# Patient Record
Sex: Female | Born: 1950 | Race: White | Hispanic: No | Smoking: Never smoker
Health system: Southern US, Community
[De-identification: ages and names within clinical notes are randomized; demographics above are authoritative.]

## PROBLEM LIST (undated history)

## (undated) DIAGNOSIS — E785 Hyperlipidemia, unspecified: Secondary | ICD-10-CM

## (undated) DIAGNOSIS — M1990 Unspecified osteoarthritis, unspecified site: Secondary | ICD-10-CM

## (undated) DIAGNOSIS — I480 Paroxysmal atrial fibrillation: Secondary | ICD-10-CM

## (undated) DIAGNOSIS — I483 Typical atrial flutter: Secondary | ICD-10-CM

## (undated) DIAGNOSIS — I10 Essential (primary) hypertension: Secondary | ICD-10-CM

## (undated) DIAGNOSIS — R0683 Snoring: Secondary | ICD-10-CM

## (undated) DIAGNOSIS — M5430 Sciatica, unspecified side: Secondary | ICD-10-CM

## (undated) DIAGNOSIS — E663 Overweight: Secondary | ICD-10-CM

## (undated) HISTORY — PX: REPLACEMENT TOTAL KNEE: SUR1224

## (undated) HISTORY — DX: Hyperlipidemia, unspecified: E78.5

## (undated) HISTORY — PX: APPENDECTOMY: SHX54

## (undated) HISTORY — DX: Unspecified osteoarthritis, unspecified site: M19.90

## (undated) HISTORY — DX: Overweight: E66.3

## (undated) HISTORY — DX: Paroxysmal atrial fibrillation: I48.0

## (undated) HISTORY — DX: Sciatica, unspecified side: M54.30

## (undated) HISTORY — DX: Essential (primary) hypertension: I10

## (undated) HISTORY — DX: Snoring: R06.83

## (undated) HISTORY — DX: Typical atrial flutter: I48.3

## (undated) HISTORY — PX: BREAST LUMPECTOMY: SHX2

## (undated) HISTORY — PX: KNEE SURGERY: SHX244

## (undated) HISTORY — PX: TONSILLECTOMY AND ADENOIDECTOMY: SUR1326

## (undated) HISTORY — PX: COLONOSCOPY: SHX174

---

## 1999-03-13 ENCOUNTER — Encounter: Payer: Self-pay | Admitting: Orthopedic Surgery

## 1999-03-15 ENCOUNTER — Inpatient Hospital Stay (HOSPITAL_COMMUNITY): Admission: RE | Admit: 1999-03-15 | Discharge: 1999-03-20 | Payer: Self-pay | Admitting: Orthopedic Surgery

## 1999-03-15 ENCOUNTER — Encounter: Payer: Self-pay | Admitting: Orthopedic Surgery

## 2001-08-13 ENCOUNTER — Encounter: Payer: Self-pay | Admitting: Family Medicine

## 2001-12-02 ENCOUNTER — Encounter: Payer: Self-pay | Admitting: Orthopedic Surgery

## 2001-12-04 ENCOUNTER — Inpatient Hospital Stay (HOSPITAL_COMMUNITY): Admission: RE | Admit: 2001-12-04 | Discharge: 2001-12-08 | Payer: Self-pay | Admitting: Orthopedic Surgery

## 2001-12-04 ENCOUNTER — Encounter: Payer: Self-pay | Admitting: Orthopedic Surgery

## 2005-01-31 ENCOUNTER — Encounter: Payer: Self-pay | Admitting: Family Medicine

## 2005-07-08 ENCOUNTER — Emergency Department: Payer: Self-pay | Admitting: Emergency Medicine

## 2009-01-02 ENCOUNTER — Ambulatory Visit: Payer: Self-pay | Admitting: Family Medicine

## 2009-01-02 DIAGNOSIS — R030 Elevated blood-pressure reading, without diagnosis of hypertension: Secondary | ICD-10-CM | POA: Insufficient documentation

## 2009-01-02 DIAGNOSIS — I4891 Unspecified atrial fibrillation: Secondary | ICD-10-CM | POA: Insufficient documentation

## 2009-01-13 ENCOUNTER — Ambulatory Visit: Payer: Self-pay | Admitting: Internal Medicine

## 2009-02-28 ENCOUNTER — Ambulatory Visit: Payer: Self-pay

## 2009-02-28 ENCOUNTER — Encounter: Payer: Self-pay | Admitting: Internal Medicine

## 2009-07-17 ENCOUNTER — Ambulatory Visit: Payer: Self-pay | Admitting: Family Medicine

## 2009-07-17 DIAGNOSIS — M549 Dorsalgia, unspecified: Secondary | ICD-10-CM | POA: Insufficient documentation

## 2010-05-01 NOTE — Assessment & Plan Note (Signed)
Summary: FELL IN OFFICE.Marland Kitchen CYD   Vital Signs:  Patient profile:   60 year old female Height:      69 inches Weight:      279.75 pounds BMI:     41.46 Temp:     97.6 degrees F oral Pulse rate:   64 / minute Pulse rhythm:   regular BP sitting:   118 / 70  (left arm) Cuff size:   large  Vitals Entered By: Benny Lennert CMA Duncan Dull) (July 17, 2009 4:16 PM)  History of Present Illness: Chief complaint fell in office  the patient was at work today, and she fell, and struck  the left posterior lower back  against  the door frame when she slipped. They have injury, today, July 17, 2009.  At this point, she feels fairly well. She has no mid spinal pain. She has no radiculopathy, no pain in her arms, were any extremities. The pain in her hips.  Past medical history is significant for bilateral knee replacements.  REVIEW OF SYSTEMS  GEN: No systemic complaints, no fevers, chills, sweats, or other acute illnesses MSK: Detailed in the HPI GI: tolerating PO intake without difficulty Neuro: No numbness, parasthesias, or tingling associated. Otherwise the pertinent positives of the ROS are noted above.    GEN: Well-developed,well-nourished,in no acute distress; alert,appropriate and cooperative throughout examination HEENT: Normocephalic and atraumatic without obvious abnormalities. No apparent alopecia or balding. Ears, externally no deformities PULM: Breathing comfortably in no respiratory distress EXT: No clubbing, cyanosis, or edema PSYCH: Normally interactive. Cooperative during the interview. Pleasant. Friendly and conversant. Not anxious or depressed appearing. Normal, full affect.     HIP EXAM: ROM: Abduction, Flexion, Internal and External range of motion: Pain with terminal IROM and EROM: no GTB: NT SLR: NEG  Back: Moderately tender in the paraspinous region in the SI joint on the left. Range of motion, flexion, extension, lateral bending and rotation.  Normal heel walk  and toe raise.  No mid spinal tenderness.   Allergies: 1)  ! Sulfa 2)  ! * Shellfish 3)  ! * Fish   Impression & Recommendations:  Problem # 1:  BACK PAIN (ICD-724.5) Assessment New left paraspinous pain, and secondary to muscular contusion.  Maybe some left SI component as well. Expect that the patient likely will be in improved in 2-3 weeks,  in most circumstances.  Recommended  heat, Aleve 220 mg p.o. b.i.d.,  and some massage.  Muscle relaxants would be reasonable if worsening pain and trouble sleeping.  Reasonable to observe at this point.  Her updated medication list for this problem includes:    Aspirin 325 Mg Tabs (Aspirin) .Marland Kitchen... Take 1 tablet by mouth once a day  Complete Medication List: 1)  Cardizem Cd 120 Mg Xr24h-cap (Diltiazem hcl coated beads) .... Take 1 capsule by mouth once a day as needed 2)  Xanax 0.5 Mg Tabs (Alprazolam) .... Take 1/2 tablet by mouth as needed - attacks. 3)  Aspirin 325 Mg Tabs (Aspirin) .... Take 1 tablet by mouth once a day  Current Allergies (reviewed today): ! SULFA ! * SHELLFISH ! * FISH

## 2010-05-18 ENCOUNTER — Inpatient Hospital Stay (HOSPITAL_COMMUNITY)
Admission: EM | Admit: 2010-05-18 | Discharge: 2010-05-22 | DRG: 310 | Disposition: A | Discharge status: Home / Self Care | Payer: 59 | Attending: Cardiovascular Disease | Admitting: Cardiovascular Disease

## 2010-05-18 ENCOUNTER — Encounter: Payer: Self-pay | Admitting: Family Medicine

## 2010-05-18 ENCOUNTER — Ambulatory Visit (INDEPENDENT_AMBULATORY_CARE_PROVIDER_SITE_OTHER): Payer: Self-pay | Admitting: Family Medicine

## 2010-05-18 ENCOUNTER — Emergency Department (HOSPITAL_COMMUNITY): Payer: 59

## 2010-05-18 DIAGNOSIS — I495 Sick sinus syndrome: Secondary | ICD-10-CM | POA: Diagnosis present

## 2010-05-18 DIAGNOSIS — E119 Type 2 diabetes mellitus without complications: Secondary | ICD-10-CM | POA: Diagnosis present

## 2010-05-18 DIAGNOSIS — I4891 Unspecified atrial fibrillation: Principal | ICD-10-CM | POA: Diagnosis present

## 2010-05-18 DIAGNOSIS — E785 Hyperlipidemia, unspecified: Secondary | ICD-10-CM | POA: Diagnosis present

## 2010-05-18 DIAGNOSIS — Z7982 Long term (current) use of aspirin: Secondary | ICD-10-CM

## 2010-05-18 DIAGNOSIS — R002 Palpitations: Secondary | ICD-10-CM

## 2010-05-18 DIAGNOSIS — E669 Obesity, unspecified: Secondary | ICD-10-CM | POA: Diagnosis present

## 2010-05-18 DIAGNOSIS — I10 Essential (primary) hypertension: Secondary | ICD-10-CM | POA: Diagnosis present

## 2010-05-18 DIAGNOSIS — F40298 Other specified phobia: Secondary | ICD-10-CM | POA: Diagnosis present

## 2010-05-18 DIAGNOSIS — Z91013 Allergy to seafood: Secondary | ICD-10-CM

## 2010-05-18 DIAGNOSIS — Z79899 Other long term (current) drug therapy: Secondary | ICD-10-CM

## 2010-05-18 DIAGNOSIS — Z882 Allergy status to sulfonamides status: Secondary | ICD-10-CM

## 2010-05-18 DIAGNOSIS — E7800 Pure hypercholesterolemia, unspecified: Secondary | ICD-10-CM | POA: Diagnosis present

## 2010-05-18 LAB — DIFFERENTIAL
Basophils Absolute: 0 10*3/uL (ref 0.0–0.1)
Basophils Relative: 0 % (ref 0–1)
Eosinophils Absolute: 0.2 10*3/uL (ref 0.0–0.7)
Eosinophils Relative: 2 % (ref 0–5)
Lymphocytes Relative: 33 % (ref 12–46)
Lymphs Abs: 2.4 10*3/uL (ref 0.7–4.0)
Monocytes Absolute: 0.6 10*3/uL (ref 0.1–1.0)
Monocytes Relative: 9 % (ref 3–12)
Neutro Abs: 4 10*3/uL (ref 1.7–7.7)
Neutrophils Relative %: 56 % (ref 43–77)

## 2010-05-18 LAB — POCT I-STAT, CHEM 8
BUN: 12 mg/dL (ref 6–23)
Calcium, Ion: 1.05 mmol/L — ABNORMAL LOW (ref 1.12–1.32)
Chloride: 109 mEq/L (ref 96–112)
Creatinine, Ser: 0.7 mg/dL (ref 0.4–1.2)
Glucose, Bld: 108 mg/dL — ABNORMAL HIGH (ref 70–99)
HCT: 45 % (ref 36.0–46.0)
Hemoglobin: 15.3 g/dL — ABNORMAL HIGH (ref 12.0–15.0)
Potassium: 4.3 mEq/L (ref 3.5–5.1)
Sodium: 140 mEq/L (ref 135–145)
TCO2: 23 mmol/L (ref 0–100)

## 2010-05-18 LAB — CARDIAC PANEL(CRET KIN+CKTOT+MB+TROPI): Total CK: 62 U/L (ref 7–177)

## 2010-05-18 LAB — CBC
HCT: 43.6 % (ref 36.0–46.0)
Hemoglobin: 14.9 g/dL (ref 12.0–15.0)
MCH: 27.5 pg (ref 26.0–34.0)
MCHC: 34.2 g/dL (ref 30.0–36.0)
MCV: 80.6 fL (ref 78.0–100.0)
Platelets: 275 10*3/uL (ref 150–400)
RBC: 5.41 MIL/uL — ABNORMAL HIGH (ref 3.87–5.11)
RDW: 13.9 % (ref 11.5–15.5)
WBC: 7.2 10*3/uL (ref 4.0–10.5)

## 2010-05-18 LAB — CK TOTAL AND CKMB (NOT AT ARMC)
CK, MB: 1.5 ng/mL (ref 0.3–4.0)
Relative Index: INVALID (ref 0.0–2.5)
Total CK: 69 U/L (ref 7–177)

## 2010-05-18 LAB — MAGNESIUM: Magnesium: 2.1 mg/dL (ref 1.5–2.5)

## 2010-05-18 LAB — TROPONIN I: Troponin I: 0.01 ng/mL (ref 0.00–0.06)

## 2010-05-19 DIAGNOSIS — I517 Cardiomegaly: Secondary | ICD-10-CM

## 2010-05-19 DIAGNOSIS — I4891 Unspecified atrial fibrillation: Secondary | ICD-10-CM

## 2010-05-19 LAB — LIPID PANEL
Cholesterol: 204 mg/dL — ABNORMAL HIGH (ref 0–200)
LDL Cholesterol: 119 mg/dL — ABNORMAL HIGH (ref 0–99)
Total CHOL/HDL Ratio: 5.1 RATIO
Triglycerides: 224 mg/dL — ABNORMAL HIGH (ref <150)

## 2010-05-19 LAB — CBC
Hemoglobin: 13.8 g/dL (ref 12.0–15.0)
MCHC: 32.6 g/dL (ref 30.0–36.0)
RBC: 5.24 MIL/uL — ABNORMAL HIGH (ref 3.87–5.11)
WBC: 6.5 10*3/uL (ref 4.0–10.5)

## 2010-05-19 LAB — HEMOGLOBIN A1C
Hgb A1c MFr Bld: 5.9 % — ABNORMAL HIGH (ref <5.7)
Mean Plasma Glucose: 123 mg/dL — ABNORMAL HIGH (ref <117)

## 2010-05-19 LAB — BASIC METABOLIC PANEL
BUN: 7 mg/dL (ref 6–23)
GFR calc non Af Amer: >60 mL/min (ref >60)
Glucose, Bld: 124 mg/dL — ABNORMAL HIGH (ref 70–99)
Potassium: 4 mEq/L (ref 3.5–5.1)

## 2010-05-19 LAB — CARDIAC PANEL(CRET KIN+CKTOT+MB+TROPI): CK, MB: 1.2 ng/mL (ref 0.3–4.0)

## 2010-05-20 ENCOUNTER — Observation Stay (HOSPITAL_COMMUNITY): Payer: 59

## 2010-05-20 DIAGNOSIS — I4891 Unspecified atrial fibrillation: Secondary | ICD-10-CM

## 2010-05-20 LAB — CBC
HCT: 44.6 % (ref 36.0–46.0)
Hemoglobin: 14.9 g/dL (ref 12.0–15.0)
WBC: 7.2 10*3/uL (ref 4.0–10.5)

## 2010-05-20 LAB — BASIC METABOLIC PANEL
CO2: 22 mEq/L (ref 19–32)
Glucose, Bld: 125 mg/dL — ABNORMAL HIGH (ref 70–99)
Potassium: 4 mEq/L (ref 3.5–5.1)
Sodium: 140 mEq/L (ref 135–145)

## 2010-05-20 MED ORDER — TECHNETIUM TC 99M TETROFOSMIN IV KIT
10.0000 | PACK | Freq: Once | INTRAVENOUS | Status: AC | PRN
  Administered 2010-05-20: 10 via INTRAVENOUS

## 2010-05-20 MED ORDER — TECHNETIUM TC 99M TETROFOSMIN IV KIT
30.0000 | PACK | Freq: Once | INTRAVENOUS | Status: AC | PRN
  Administered 2010-05-20: 30 via INTRAVENOUS

## 2010-05-21 ENCOUNTER — Observation Stay (HOSPITAL_COMMUNITY): Payer: 59

## 2010-05-22 LAB — BASIC METABOLIC PANEL
CO2: 23 mEq/L (ref 19–32)
Glucose, Bld: 124 mg/dL — ABNORMAL HIGH (ref 70–99)
Potassium: 4.1 mEq/L (ref 3.5–5.1)
Sodium: 137 mEq/L (ref 135–145)

## 2010-05-23 NOTE — Assessment & Plan Note (Signed)
Summary: Dizzy,clammy//kad   Vital Signs:  Patient profile:   60 year old female Weight:      279.75 pounds BMI:     41.46 Pulse rate:   64 / minute Pulse (ortho):   64 / minute Pulse rhythm:   irregular BP sitting:   138 / 80  (left arm) BP standing:   130 / 76 Cuff size:   large  Vitals Entered By: Selena Batten Dance CMA Duncan Dull) (May 18, 2010 9:44 AM)  Serial Vital Signs/Assessments:  Time      Position  BP       Pulse  Resp  Temp     By 9:52 AM   Lying LA  150/80   60                    Kim Dance CMA (AAMA) 9:52 AM   Sitting   138/80   64                    Kim Dance CMA (AAMA) 9:52 AM   Standing  130/76   64                    Kim Dance CMA (AAMA)  CC: Dizzy,clammy,not feeling well Comments **EKG done   History of Present Illness: CC: dizzy  59yo with h/o pAfib but had been controlled on dilt 120mg  XR daily presents with episode of dizziness.  Has been taking diltiazem and ASA regularly for last month.  has been in sinus rhythm as far as she can tell.  Felt heart start racing around 7:30am.  This morning on way to work felt heart starting to race, beat fast.  This morning at work felt legs weakening and presyncopal.  Initially felt "room spinning" then nausea then diaphoretic.  All of that has passed.  However continues to feel lightheaded.  No CP/tightness, significant SOB, HA.  No caffeine now.    under a lot of stress - son died last month, then daughter in car accident, work stress.  other son causing worrying.  Husband Elijah Birk went into hospital with CHF.  Last night had argument with son.  thinks this is contributing  Current Medications (verified): 1)  Cardizem Cd 120 Mg Xr24h-Cap (Diltiazem Hcl Coated Beads) .... Take 1 Capsule By Mouth Once A Day As Needed 2)  Xanax 0.5 Mg Tabs (Alprazolam) .... Take 1/2 Tablet By Mouth As Needed - Attacks. 3)  Aspirin 325 Mg  Tabs (Aspirin) .... Take 1 Tablet By Mouth Once A Day  Allergies: 1)  ! Sulfa 2)  ! * Shellfish 3)  ! *  Fish  Past History:  Past Medical History: Last updated: 01/13/2009 Atrial fibrillation   --echo 2003: normal EF. LAE 4.8cm   --stress myoview normal Obesity Borderline HTN  Past Surgical History: Last updated: 10-Jan-2009 Total knee replacement- right in 2000, left in 2003 Breast biopsy 1998  Family History: Last updated: 01/10/2009 Dad - CAD, CABG at 29, died at 50 Mom - MI at 40, DM, died at 75 with renal failure  Social History: Last updated: 01/10/09 Lives with her husband adn 82 yo daughter.  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  Husband works at [employer] and [employer].  Has a very close knit and supportive family.   PMH-FH-SH reviewed for relevance  Review of Systems       per HPI  Physical Exam  General:  Well-developed,well-nourished,in no acute distress; alert,appropriate and cooperative throughout  examination, very pleasant. Eyes:  No corneal or conjunctival inflammation noted. EOMI. Perrla.  Mouth:  Oral mucosa and oropharynx without lesions or exudates.  Teeth in good repair. Neck:  No deformities, masses, or tenderness noted.  no bruits, no JVD Lungs:  Normal respiratory effort, chest expands symmetrically. Lungs are clear to auscultation, no crackles or wheezes. Heart:  irregularly irregular, tachycardic Abdomen:  Bowel sounds positive,abdomen soft and non-tender without masses, organomegaly or hernias noted. Pulses:  2+ rad pulses, brisk cap refill Extremities:  no pedal edema Skin:  Intact without suspicious lesions or rashes   Impression & Recommendations:  Problem # 1:  ATRIAL FIBRILLATION (ICD-427.31)  with RVR currently, started this morning.  likely stress contributing.  sent to ER for further evaulation/stabilization.  will likely need r/o ACS.  EKG - afib 160s  Her updated medication list for this problem includes:    Cardizem Cd 120 Mg Xr24h-cap (Diltiazem hcl coated beads) .Marland Kitchen... Take 1 capsule by mouth once a day as needed    Aspirin 325 Mg  Tabs (Aspirin) .Marland Kitchen... Take 1 tablet by mouth once a day  Orders: EKG w/ Interpretation (93000)  Complete Medication List: 1)  Cardizem Cd 120 Mg Xr24h-cap (Diltiazem hcl coated beads) .... Take 1 capsule by mouth once a day as needed 2)  Xanax 0.5 Mg Tabs (Alprazolam) .... Take 1/2 tablet by mouth as needed - attacks. 3)  Aspirin 325 Mg Tabs (Aspirin) .... Take 1 tablet by mouth once a day   Orders Added: 1)  Est. Patient Level IV [81191] 2)  EKG w/ Interpretation [93000]    Current Allergies (reviewed today): ! SULFA ! * SHELLFISH ! * FISH   Appended Document: Dizzy,clammy//kad called cards Trich, rec send via ambulance and they will be watching for her.  called charge nurse at ER to notify as well.

## 2010-05-23 NOTE — H&P (Addendum)
[name] Strickland, [name] Strickland                  ACCOUNT NO.:  [id number]  MEDICAL RECORD NO.:  [id number]           PATIENT TYPE:  I  LOCATION:  2010                         FACILITY:  [facility]  PHYSICIAN:  Veverly Fells. [name], MD  DATE OF BIRTH:  09-Aug-1950  DATE OF ADMISSION:  05/18/2010 DATE OF DISCHARGE:                             HISTORY & PHYSICAL   PRIMARY CARDIOLOGIST:  Bevelyn Buckles. Bensimhon, MD.  Last office visit October 2010.  PRIMARY MEDICAL DOCTOR:  Ruthe Mannan, MD at [facility].  CHIEF COMPLAINT:  Palpitations.  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   She presented to the ER with an episode of AFib with RVR.  She has been under an extreme stress lately.  Her 85 year old son died by a train and investigations are going on to find out what happened.  Her daughter recently got into a car accident, but is okay, and her husband is in the hospital for CHF at [facility].  She had an argument with another son over money issues last night.  Two days after her son died, she had an episode of palpitations which she describes like previous atrial fibrillation episode she has had in which she took an extra Cardizem and Xanax and went to sleep and woke up and the symptoms had subsided.  Three days ago, she experienced the same with back pain.  This morning, took her medications 45 a.m. and while driving to work at 5:62, had an onset of fluttering and palpitations while driving to work.  She felt since they were really busy at work, she would discontinue on with her normal activities.  However, while standing, she became very dizzy all of sudden, diaphoretic and drenching with sweat and her legs felt weak.  She had to sit down.  She denies any shortness of breath or chest pain, but she states for a  second she could not catch her breath.  One of the physicians that works at the clinic had her sit down.  She had a coworker take her blood pressure which was okay, but her heart rate was elevated and she was subsequently sent to the ER for further evaluation.  On admission to the ER, EKG showed AFib with RVR with a rate in the 150s with stable blood pressure.  She was started on a diltiazem drip at 20 mg per hour by the ER physician without a bolus and is currently maintaining heart rate in the 80s to 90s.  She is less dizzy, but still feels some palpitations and just does not quite feel right.  Initial lab work including I-stat, CBC and BMET is unremarkable except for a mildly elevated glucose of 108.  PAST MEDICAL HISTORY: 1. Paroxysmal atrial fibrillation.     a.     In 2003, the patient was seen in [place] and was started on      rate control with diltiazem and converted to normal  sinus rhythm      on her own.  She had a negative Myoview at that time with a normal      EF.     b.     In 2006, the patient also returned at [facility] for a fairly      similar episode, but since then her symptoms have been relatively      at credence.  She has not been on Coumadin secondary to a low      CHADs score.     c.     Last office visit October 2010, Dr. Gala Romney suggested      anterior arrhythmic therapy maybe consideration if she continues      to have breakthrough episodes. 2. Obesity. 3. Reported history of borderline hypertension. 4. Possibly elevated cholesterol at the Health Screening at [facility]     employees goes through. 5. Borderline elevated CBG without diagnosis of diabetes. 6. Normal left ventricular function by echo November 2010, with EF of     55-60% with grade 1 diastolic dysfunction, trivial TR and a left     atrial size a 40-mm.  PAST SURGICAL HISTORY:  Benign breast lumpectomy, tonsillectomy and appendectomy.  MEDICATIONS: 1. Cardizem 120 mg daily. 2. Xanax 0.5 mg half  tablet daily p.r.n. 3. Aspirin 325 mg daily.  ALLERGIES:  To SULFA, SHELLFISH and FISH.  The patient also states she received some sort of medication prior to her knee surgery approximately 10 years ago that caused itching, but she does not remember what it is.  SOCIAL HISTORY:  [name] Strickland is married.  She works as an Public house manager at [employer].  As outlined above, she has had increased stress lately. She denies any history of tobacco abuse whatsoever and does not drink or do illicit drugs including cocaine, heroin and marijuana.  She does not exercise, only does her walking around at work and gets no chest pain or shortness of breath with this.  REVIEW OF SYSTEMS:  She has tried to pay more attention to eating healthier, so has lost 5 pounds in the past few weeks.  She is hot nature in general, but denies any fevers, chills, sweats.  No chest pain, dyspnea on exertion and syncope.  Positive for nausea with this episode, but no vomiting, diarrhea, bright blood per rectum, melena or hematemesis.  All other systems are reviewed and otherwise negative.  LABORATORY DATA:  WBC 7.2, hemoglobin 14.9, hematocrit 43.6, platelet count 275.  Sodium 140, potassium 4.3, chloride 108, BUN 12, creatinine 0.7, glucose 108.  RADIOLOGY:  No studies have been performed yet.  PHYSICAL EXAMINATION:  VITAL SIGNS:  Temperature 97.5, pulse 102, respirations 20, blood pressure 120/75 and pulse ox 99% on room air. GENERAL:  This is a pleasant, well-appearing white female, in no acute distress who is overweight. HEENT:  Normocephalic, atraumatic with extraocular movements intact. Clear sclerae.  Nares without discharge. NECK:  Supple without carotid bruit or JVD. CARDIAC:  Auscultation of the heart reveals irregular rhythm with borderline tachycardia without obvious murmurs, rubs or gallops.  LUNGS: Clear to auscultation bilaterally without wheezes, rales or rhonchi. ABDOMEN:  Soft, round, nontender and  nondistended with normoactive bowel sounds.  No rebound or guarding. EXTREMITIES:  Warm and dry without edema.  She has 2+ pedal pulses bilaterally. NEUROLOGICALLY:  She is alert and oriented x3.  She responds to questions appropriately with a normal affect.  ASSESSMENT AND PLAN:  The patient was seen and examined by Dr.  Avary Pitsenbarger and myself in the ER.  This is a very pleasant 60 year old lady with a history of PAF, who presents with an episode of recurrent symptomatic AFib, with RVR this afternoon with dizziness and diaphoresis.  She has had 3 episodes of symptomatic palpitations this month.  She is currently in atrial fibrillation with rates in the 80s to 90s on a diltiazem drip. Plan at present is to continue her diltiazem drip and add IV heparin. Although, she may not need Coumadin in the long-term, her heparin will be continued as an inpatient.  Her aspirin will be continued.  She may convert as she typically does.  If she does so, I would consider an outpatient Myoview to rule out ischemic heart disease given her family history of heart disease.  If it is negative, we might be able to consider flecainide for daily use or the pill on the pocket approach.  We will also check a complete lab work including cardiac enzymes, TSH, hemoglobin A1c and a fasting lipid panel.  The patient was updated the plan and is in agreement.  If she converts, would likely consider increasing her diltiazem to 240 mg p.o. daily.     Ronie Spies, P.A.C.   ______________________________ Veverly Fells. [name], MD    DD/MEDQ  D:  05/18/2010  T:  05/19/2010  Job:  [id number]  cc:   Ruthe Mannan, M.D. Bevelyn Buckles. Bensimhon, MD  Electronically Signed by Ronie Spies  on 05/23/2010 11:51:39 AM Electronically Signed by Tonny Bollman MD on 06/05/2010 09:09:05 PM

## 2010-06-02 ENCOUNTER — Telehealth (INDEPENDENT_AMBULATORY_CARE_PROVIDER_SITE_OTHER): Payer: Self-pay | Admitting: *Deleted

## 2010-06-05 ENCOUNTER — Telehealth (INDEPENDENT_AMBULATORY_CARE_PROVIDER_SITE_OTHER): Payer: Self-pay | Admitting: *Deleted

## 2010-06-06 ENCOUNTER — Encounter: Payer: 59 | Admitting: Internal Medicine

## 2010-06-07 NOTE — Progress Notes (Addendum)
Summary: Cardiology Phone Note - Bradycardia  Phone Note Call from Patient Call back at Home Phone [phone]   Reason for Call: Talk to Doctor Summary of Call: Returned call from patient concerning mild SOB and decreased HR.  Pt states her HR is high 40s to low 50s at this time.  She was recently discharged from the hosptial for tachy-brady syndrome where her flecainide was increased to 100mg  two times a day and her Cardizem was increased to 240 mg daily.  She says over the last several days she has felt fatigued with mild SOB.  She has taken off from work and rested without relief.  She denies any palpitations, CP, or dizziness and feels she is still in NSR.  I have asked patient to decrease her cardizem back to 120 mg daily.  She voices understanding.  I have also informed her that if her symptoms get worse she should come to the ER.  She voices understanding.  If she is still feeling fatigue she states she will call Dr. Ladona Ridgel on Monday for further recommendations concerning her flecainide.  She appreciated the call back.  Initial call taken by: Robbi Garter NP-PA,  June 02, 2010 3:10 PM

## 2010-06-12 ENCOUNTER — Telehealth (INDEPENDENT_AMBULATORY_CARE_PROVIDER_SITE_OTHER): Payer: Self-pay | Admitting: *Deleted

## 2010-06-12 NOTE — Progress Notes (Signed)
  Faxed Pt FMLA packet, she completed faxed back to me,Daughter dropped off FMLA papers will forward all to [employer]. [facility] [place]  June 05, 2010 9:05 AM

## 2010-06-13 NOTE — Discharge Summary (Signed)
[name] Strickland, [name] Strickland                  ACCOUNT NO.:  [id number]  MEDICAL RECORD NO.:  [id number]           PATIENT TYPE:  I  LOCATION:  2010                         FACILITY:  [facility]  PHYSICIAN:  Doylene Canning. Ladona Ridgel, MD    DATE OF BIRTH:  1951-03-31  DATE OF ADMISSION:  05/18/2010 DATE OF DISCHARGE:  05/22/2010                              DISCHARGE SUMMARY   PRIMARY CARDIOLOGIST:  Bevelyn Buckles. Bensimhon, MD  ELECTROPHYSIOLOGIST:  Doylene Canning. Ladona Ridgel, MD  PRIMARY MEDICAL DOCTOR:  Dr. Ruthe Mannan at [facility].  DISCHARGE DIAGNOSIS:  Atrial fibrillation with tachy-brady syndrome status post return to normal sinus rhythm.  SECONDARY DIAGNOSES: 1. Obesity. 2. Borderline hypertension. 3. Borderline elevated CBG rule out. 4. Diabetes. 5. History of dyslipidemia.  PROCEDURES/DIAGNOSTICS PERFORMED DURING HOSPITALIZATION: 1. An echocardiogram on May 19, 2010.  This is a technically     limited study.  Left ventricle had mild pattern of complex LVH.     LVEF was 60%.  Regional wall motion abnormalities cannot be     excluded.  Left and right atrium was mildly dilated. 2. Steffanie Dunn on May 20, 2010.  Negative for inducible     ischemia with pharmacologic stress.  Ejection fraction was     decreased at 21%.  Wall motion showed mild diffuse hypokinesis with     very slight dyskinesis of the apex. 3. Chest x-ray on May 17, 2010, which showed no active     cardiopulmonary disease process.  REASON FOR HOSPITALIZATION:  This is a 60 year old female with history of paroxysmal atrial fibrillation who was using rate control.  The patient presented to the emergency department after several episodes of fluttering and palpitations.  Prior to admission, the patient was symptomatic with her atrial fibrillation, dizziness, diaphoresis, and weakness in her legs.  EKG in the emergency department showed atrial fibrillation with rapid ventricular response at a rate of 150 beats  per minute.  She was started on a diltiazem drip by the ER physician without a bolus and upon evaluation by the Cardiology had been maintaining heart rates in 80s to 90s.  The patient was continued on her diltiazem drip and IV heparin was added.  HOSPITAL COURSE:  On the evening of admission, the patient did brady down into the 30s to 40s, therefore her Cardizem infusion was stopped. Her rates quickly rebounded to 50s and 60s.  Per Dr. Mordecai Maes, it was told not to start her Cardizem drip at this time.  Several hours later, the patient's heart rate did increase to 140, still in atrial fibrillation, and her Cardizem was restarted.  The patient's heart rate was in better control.  Given the patient's recurrent symptomatic atrial fibrillation it was felt that this would be better to maintain normal sinus rhythm. Therefore, a Lexiscan Myoview was planned for the morning, and able to use flecainide.  At this time, [name] was also started, heparin was discontinued.  It was noted that if the patient remained in atrial fibrillation then after 5 doses of [name], we can proceed with TEE/current cardioversion.  Diltiazem at this time was changed to  a goal dose.  A 2-D echocardiogram was completed.  It did show LVEF 60%. Regional wall motion abnormalities cannot be excluded.  The patient then underwent a Lexiscan Myoview.  This demonstrated no inducible ischemia with pharmacological stress.  There was diffuse hypokinesis with very slight dyskinesis of the apex.  Left ventricular ejection fraction was decreased at 41%.  Dr. Mordecai Maes noted the patient was stable to start flecainide, do a cardiac MRI was ordered in order to assess left ventricular function as echo and Lexiscan Myoview demonstrated different results.  A MRI was attempted, the patient was closterfobic and unable to tolerate and therefore it was cancelled.  Of note, the patient did rule out for myocardial infarction.  Her TSH was also within  normal limits.  With the patient's atrial fibrillation and now tachybrady syndrome, an EP consult was obtained.  Dr. Ladona Ridgel evaluated the patient and noted that the patient may need a pacemaker.  Her flecainide thus was increased to 100 mg twice daily.  She will be continued on [name].  The patient has maintained atrial fibrillation and therefore a direct current cardioversion was scheduled.  Throughout the night, the patient did spontaneously convert back to normal sinus rhythm, therefore cardioversion was cancelled.  The patient felt well.    Dr. Ladona Ridgel evaluated the patient on the day of discharge and noted her stable for home.  She will be continued on [name], flecainide, and Cardizem at this time.  Discharge plans and instructions were discussed with the patient and she voiced understanding.  DISCHARGE LABS:  Sodium 137, potassium 4.1, BUN 11, creatinine 0.81, cholesterol 204, triglycerides 224, HDL 40, LDL 119.  DISCHARGE MEDICATIONS: 1. [name] 150 mg 1 tablet twice daily. 2. Flecainide 100 mg 1 tablet twice daily. 3. Aspirin enteric-coated 81 mg 1 tablet daily. 4. Diltiazem CD 120 mg 2 capsules daily. 5. Xanax 0.5 mg half tablet daily as needed for anxiety.  DISCHARGE PLAN/INSTRUCTIONS: 1. The patient will follow up with Dr. Ladona Ridgel in [place] on June 26, 2010, at 9:30. 2. The patient should follow up with Dr. Dayton Martes as previously scheduled     or as needed. 3. The patient should increase activity slowly. 4. The patient may return to work on May 25, 2010. 5. The patient is to continue with low-sodium heart-healthy diet. 6. The patient is to call the office in the interim if she has any     problems or questions.  Duration of discharge greater than 30 minutes including physician and physician extender time.     Leonette Monarch, PA-C   ______________________________ Doylene Canning. Ladona Ridgel, MD    NB/MEDQ  D:  05/22/2010  T:  05/22/2010  Job:  [id number]  cc:    Bevelyn Buckles. Bensimhon, MD Ruthe Mannan, MD  Electronically Signed by Alen Blew P.A. on 05/23/2010 10:18:26 AM Electronically Signed by Lewayne Bunting MD on 06/13/2010 04:35:29 PM

## 2010-06-19 NOTE — Progress Notes (Signed)
----   Converted from flag ---- Flag Received   ---- 06/12/2010 11:18 AM, Bary Leriche wrote: Minerva Areola,  I am sending Dr. Ladona Ridgel and FMLA on his patient Jaylena Holloway, dob.  1950/04/07 to review.     Thank you,  Elease Hashimoto  @ [employer]. ------------------------------

## 2010-06-26 ENCOUNTER — Encounter: Payer: Self-pay | Admitting: Internal Medicine

## 2010-06-26 ENCOUNTER — Ambulatory Visit (INDEPENDENT_AMBULATORY_CARE_PROVIDER_SITE_OTHER): Payer: 59 | Admitting: Internal Medicine

## 2010-06-26 ENCOUNTER — Other Ambulatory Visit: Payer: Self-pay | Admitting: Emergency Medicine

## 2010-06-26 DIAGNOSIS — R030 Elevated blood-pressure reading, without diagnosis of hypertension: Secondary | ICD-10-CM

## 2010-06-26 DIAGNOSIS — I4891 Unspecified atrial fibrillation: Secondary | ICD-10-CM

## 2010-06-26 MED ORDER — DILTIAZEM HCL ER COATED BEADS 120 MG PO CP24: 120.0000 mg | ORAL_CAPSULE | Freq: Every day | ORAL | Status: DC

## 2010-06-26 MED ORDER — DILTIAZEM HCL 120 MG PO TABS: 120.0000 mg | ORAL_TABLET | Freq: Every day | ORAL | Status: DC

## 2010-06-26 NOTE — Assessment & Plan Note (Signed)
Her symptoms have been well controlled. She will continue her flecainide. I will see her back in 6 months.

## 2010-06-26 NOTE — Assessment & Plan Note (Signed)
Her blood pressure has been fairly well controlled. I have asked her to maintain a low sodium diet. She will continue her current meds. I considered stopping her cardizem as she has had some fatigue but think that we will continue this for now.

## 2010-06-26 NOTE — Patient Instructions (Signed)
Your physician recommends that you schedule a follow-up appointment in: 6 months  

## 2010-06-26 NOTE — Progress Notes (Signed)
HPI  [name] returns today for followup. She is a pleasant middle aged man with a h/o PAF, HTN, and obesity. She was hospitalized about one month ago with atrial fib and an RVR and was placed on flecainide and she reverted to NSR. She has done well since then. She has preserved LV function by echo and she has been able to maintain a 6 day work schedule. She has some fatigue but thinks this is due to her extra time spent at work. Her blood pressure has run in the 120-130 range.   Allergies  Allergen Reactions  . Shellfish-Derived Products   . Sulfonamide Derivatives     REACTION: Rash, itching.     Current Outpatient Prescriptions  Medication Sig Dispense Refill  . ALPRAZolam (XANAX) 0.5 MG tablet Take 0.5 mg by mouth at bedtime as needed.        Marland Kitchen aspirin 81 MG EC tablet Take 81 mg by mouth daily.        . dabigatran ([name]) 150 MG CAPS Take 150 mg by mouth every 12 (twelve) hours.        Marland Kitchen diltiazem (CARDIZEM) 120 MG tablet Take 120 mg by mouth daily.       . flecainide (TAMBOCOR) 100 MG tablet Take 100 mg by mouth 2 (two) times daily.           Past Medical History  Diagnosis Date  . Hypertension   . Diabetes mellitus   . Dyslipidemia   . Atrial fibrillation     ROS:   All systems reviewed and negative except as noted in the HPI.   Past Surgical History  Procedure Date  . Appendectomy   . Tonsillectomy and adenoidectomy   . Knee surgery   . Replacement total knee     bilateral, different times  . Breast lumpectomy      Family History  Problem Relation Age of Onset  . Heart attack Mother   . Heart disease Father     cabg x 7  . Heart attack Father   . Stroke Father      History   Social History  . Marital Status: Single    Spouse Name: N/A    Number of Children: N/A  . Years of Education: N/A   Occupational History  . Not on file.   Social History Main Topics  . Smoking status: Never Smoker   . Smokeless tobacco: Never Used  . Alcohol Use:  No  . Drug Use: No  . Sexually Active:    Other Topics Concern  . Not on file   Social History Narrative  . No narrative on file     BP 137/72  Pulse 51  Ht 5\' 8"  (1.727 m)  Wt 272 lb (123.378 kg)  BMI 41.36 kg/m2  Physical Exam:  Obese, middle aged woman, NAD HEENT: Unremarkable Neck:  No JVD, no thyromegally Lymphatics:  No adenopathy Back:  No CVA tenderness Lungs:  Clear with no wheezes, rales or rhonchi. HEART:  Regular rate rhythm, no murmurs, no rubs, no clicks Abd:  Flat, positive bowel sounds, no organomegally, no rebound, no guarding Ext:  2 plus pulses, no edema, no cyanosis, no clubbing Skin:  No rashes no nodules Neuro:  CN II through XII intact, motor grossly intact  EKG  Sinus brady with low voltage    Assess/Plan:

## 2010-06-29 ENCOUNTER — Telehealth: Payer: Self-pay | Admitting: Cardiovascular Disease

## 2010-06-29 NOTE — Telephone Encounter (Signed)
Pt would like a letter excusing her from doing [employer] abstractions on Saturdays.

## 2010-07-03 NOTE — Telephone Encounter (Signed)
Attempted to contact pt, LMOM TCB.  

## 2010-07-03 NOTE — Telephone Encounter (Signed)
Spoke to pt, she states that when last seen by Dr. Ladona Ridgel she presented with symptoms of fatigue, and that it was mentioned by Dr. Ladona Ridgel that this could be due to her age and working overtime. (Pt works at [employer] and has been called to work on abstractions on Saturdays). Pt states these are not mandatory, however, her manager had suggested it might be helpful if she had a note stating she could be excused from overtime work on Saturday. Dr. Ladona Ridgel, can I send pt note regarding this? Thanks, Magazine features editor.

## 2010-07-04 ENCOUNTER — Other Ambulatory Visit: Payer: Self-pay | Admitting: Emergency Medicine

## 2010-07-19 NOTE — Telephone Encounter (Signed)
Yes

## 2010-07-20 ENCOUNTER — Encounter: Payer: Self-pay | Admitting: *Deleted

## 2010-07-20 NOTE — Telephone Encounter (Signed)
Letter sent to pt

## 2010-08-17 NOTE — Op Note (Signed)
[name] Strickland, [name] Strickland                            ACCOUNT NO.:  [id number]   MEDICAL RECORD NO.:  [id number]                   PATIENT TYPE:  INP   LOCATION:  5033                                 FACILITY:  [facility]   PHYSICIAN:  Loreta Ave, M.D.              DATE OF BIRTH:  1950-12-01   DATE OF PROCEDURE:  12/04/2001  DATE OF DISCHARGE:                                 OPERATIVE REPORT   PREOPERATIVE DIAGNOSIS:  End-stage degenerative arthritis, left knee, with  marked valgus alignment and bone loss, lateral compartment.   POSTOPERATIVE DIAGNOSIS:  End-stage degenerative arthritis, left knee, with  marked valgus alignment and bone loss, lateral compartment.   PROCEDURE:  Left total knee replacement utilizing Osteonics prosthesis.  Appropriate soft tissue balancing, including partial lateral release as well  as lateral retinacular release.  Press-fit #7 posterior-stabilizing femoral  component.  Cemented #7 tibial component.  A 15 mm polyethylene insert.  Cemented, recessed, nonmetal-backed 28 mm patellar component.   SURGEON:  Loreta Ave, M.D.   ASSISTANT:  Arlys John D. Petrarca, P.A.-C.   ANESTHESIA:  General.   ESTIMATED BLOOD LOSS:  Minimal.   TOURNIQUET TIME:  1 hour 20 minutes.   SPECIMENS:  Excised bone and soft tissue.   CULTURES:  None.   COMPLICATIONS:  None.   DRESSING:  Soft compressive with knee immobilizer.   DRAINS:  Hemovac x2.   DESCRIPTION OF PROCEDURE:  Patient brought to the operating room and placed  on the operating table in the supine position.  After adequate anesthesia  had been obtained, left knee examined.  Twenty degrees of valgus alignment  correctable to about 15 degrees.  Stable ligaments but pseudolaxity of MCL.  Five degree flexion contracture, further flexion 110 degrees.  Tourniquet  applied, prepped and draped in the usual sterile fashion.  Exsanguinated  with elevation and Esmarch, tourniquet inflated to 375 mmHg.  Previous  incision in her knee was too medial to allow for approach for total knee  replacement.  Therefore, we proceeded with a straight incision above the  patella, down to the tibial tubercle, avoiding getting too close to the  other incision as much as possible.  Skin and subcutaneous tissue divided.  A medial parapatellar arthrotomy.  Marked grade 4 changes throughout with  extensive loose bodies, spurs, fibrotic material, and bony erosion,  especially the lateral femoral condyle.  Periarticular spurs, loose body,  remnants of menisci, cruciate ligaments all excised.  Distal femur exposed.  Intramedullary guide placed.  Distal cut, removing 10 mm distal femur set at  5 degrees of valgus.  Most of the bony resection was taken off the medial  side because of the deficiency laterally.  Jigs put in place after sizing  for a #7 component.  Definitive cuts made for a posterior-stabilizing #7  component.  All posterior spurs and loose bodies removed as well.  Good  sizing  and fitting of the #7 component.  Trial removed.  Tibia exposed.  A 4-  5 mm bony removal off the tibia at appropriate perpendicular to the shaft  with intramedullary guide and with a 5 degree posterior slope cut.  Once  that was complete, the tibia was sized for a #7 component.  The patella then  had removal of all the spurs and was sized, reamed, and drilled for a 28 mm  component.  I then placed a #7 trial on the femur, a #7 trial on the tibia,  28 mm trial on the patella, and a series of polyethylene inserts on the  tibia.  Because of the pseudolaxity medially, we ended up going up to a 15  mm component, which achieved a balanced knee after I did a partial capsular  and iliotibial band release laterally.  This balanced the patellofemoral  joint, and I also had to do a lateral retinacular release, which was done  from the inside out.  Once these were complete and with a 15 mm insert, I  had a nicely balanced knee with full extension  and full flexion, no  component lift-off, good tracking of the patellofemoral joint.  The tibia  was marked for rotation and hand-reamed.  All trials were removed.  The  wound copiously irrigated  with the pulse irrigating device.  Cement  prepared, placed on the tibial component, which was hammered in place.  Polyethylene attached.  Femoral component seated.  Knee reduced.  Patellar  component cemented down to the patella.  After all the excessive cement was  removed and the cement had hardened, the wound was irrigated.  A Hemovac was  placed and brought out through a separate stab wound.  Arthrotomy closed  with #1 Vicryl, skin and subcutaneous tissue with Vicryl and staples.  Margins of the wound and the knee injected with Marcaine.  A sterile  compressive dressing applied.  Tourniquet deflated and removed.  Knee  immobilizer applied.  Anesthesia reversed.  Brought to the recovery room.  Tolerated the surgery well with no complications.                                               Loreta Ave, M.D.    DFM/MEDQ  D:  12/05/2001  T:  12/07/2001  Job:  [id number]

## 2010-08-17 NOTE — Op Note (Signed)
[facility]  Patient:    [name] Strickland                          MRN: [id number] Proc. Date: 03/15/99 Adm. Date:  [id number] Attending:  Colbert Ewing                           Operative Report  PREOPERATIVE DIAGNOSIS:  End stage degenerative joint disease, right knee with marked valgus alignment.  Significant contracture lateral soft tissue structures with stretching and attenuation of medial soft tissue structures.  Bony deficiency lateral compartment due to deformity.  POSTOPERATIVE DIAGNOSIS:  End stage degenerative joint disease, right knee with  marked valgus alignment.  Significant contracture lateral soft tissue structures with stretching and attenuation of medial soft tissue structures.  Bony deficiency lateral compartment due to deformity.  Deficient anterior cruciate ligament and  markedly contracted posterior cruciate ligament.  OPERATION PERFORMED:  Right knee examination under anesthesia.  Total knee replacement utilizing osteonics prosthesis.  Press-Fit posterior stabilizing #7  femoral component.  Cemented #7 tibial component with 21 mm polyethylene insert, posterior stabilizing type.  Resection of anterior and posterior cruciate ligaments.  Cemented recessed nonmetal back 26 mm patellar component lateral retinacular release and lateral soft tissue iliotibial band release to achieve  balanced knee.  SURGEON:  Loreta Ave, M.D.  ASSISTANT:  Arlys John D. Petrarca, P.A.-C.  ANESTHESIA:  General.  ESTIMATED BLOOD LOSS:  Minimal.  SPECIMENS:  Excised bone and soft tissue.  CULTURES:  None.  COMPLICATIONS:  None.  DRAINS:  Hemovac x 2.  TOURNIQUET TIME:  One hour and 35 minutes.  OPERATIVE FINDINGS:  Under anesthesia, she had 30 degrees of valgus correctable to about 10 degrees with marked attenuation of the medial structures and contracture of lateral structures.  A 10 degree flexion contracture, further  flexion only to 90 degrees.  DESCRIPTION OF PROCEDURE:  Patient was brought to the operating room and after adequate anesthesia had been obtained, exam with findings described above. Tourniquet applied about the upper aspect of the right leg.  Exsanguinated with  elevation and Esmarch after being prepped and draped.  Tourniquet inflated to 350 mmHg.  Straight incision above the patella down to the tibial tubercle holding the leg corrected as much as possible to make the incision, so that it would be  straight at completion.  Skin and subcutaneous tissue divided. Marked soft tissue contracture extra-articularly and the lateral strutures released.  Medial parapatellar arthrotomy.  Knee exposed.  Exuberant hypertrophic osteoarthritis ith marked spurs and loose bodies throughout.  All of these were removed.  Remnants of ACL and markedly contracted PCL resected.  Distal femur exposed. Intra-medullary guide placed.  Definitive cut removing 12 mm off the distal end of the femur set at 5 degrees of valgus.  Resection at that level due to the deficiency laterally.  Sized for a 37 component. Jigs put in place and definitive cuts made. Fortunately, I had reasonable bone stock left even laterally to allow for a standard posterior stabilizing approach. Final cuts made for the posterior stabilizing prosthesis.  Trials put in place with excellent capturing, fitting and alignment.  Trials removed.  Tibia exposed.  Intramedullary guide placed.  Proximal cut with a 5 degree posterior slope cut removing 6 mm off the deficient medial side resecting just enough to get down to reasonable bone.  Trials put in place with a  21 mm component.  Appropriate release of lateral retinaculum and the anterior aspect f the iliotibial band as well as intra and extra-articular contractures.  With this, I had a balanced knee set at 5 degrees of valgus stable in extension and flexion and allowing full extension  and full flexion without component left off.  Tibia was marked for appropriate component placement and was hand reamed for the keel. Trials removed.  Patella was sized, reamed and drilled for a 26 mm component. ll trials removed.  Copious irrigation throughout.  All loose bodies removed including the hypertrophic glabella which was very exuberant 3 times normal size because f marked spurring throughout.  All recess examined.  Care taken to protect all nerves and vascular structures throughout.  Pulse irrigation of the entire knee and all recesses.  Cement prepared and placed on the tibial and patellar components. Tibial component hammered in place.  Cement removed from the margins. Polyethylene attached.  Patellar component put in place and excessive cement removed. Femoral component hammered in place.  Knee was reduced.  Full extension, full flexion, balanced knee set at 5 degrees of valgus with reasonable stability including the MCL after appropriate soft tissue release was completed laterally.  The knee was held in flexion until the cement hardened.  Re-examined with good balancing of he tibial-femoral as well as the patellofemoral joint at completion.  Hemovacs were placed and brought out through separate stab wounds.  Arthrotomy closed with #1  Vicryl, skin and subcutaneous tissues with Vicryl and staples.  Margins of the wound injected with Marcaine as was the knee and Hemovacs were clamped. Sterile compressive dressing applied.  Tourniquet inflated and removed.  Knee immobilizer applied.  Anesthesia was then to proceed with the placement of an epidural catheter for postoperative analgesia.  Following this, anesthesia reversed.  Brought to recovery room.  Tolerated surgery well.  No complications. DD:  03/15/99 TD:  03/16/99 Job: [id number]

## 2010-08-17 NOTE — Discharge Summary (Signed)
[name] Strickland, [name] Strickland                            ACCOUNT NO.:  [id number]   MEDICAL RECORD NO.:  [id number]                   PATIENT TYPE:  INP   LOCATION:  5033                                 FACILITY:  [facility]   PHYSICIAN:  Loreta Ave, M.D.              DATE OF BIRTH:  Sep 04, 1950   DATE OF ADMISSION:  12/04/2001  DATE OF DISCHARGE:  12/08/2001                                 DISCHARGE SUMMARY   ADMISSION DIAGNOSIS:  Advanced degenerative joint disease, left knee, with  post-traumatic exacerbation.   DISCHARGE DIAGNOSES:  1. Advanced degenerative joint disease, left knee, with post-traumatic     exacerbation.  2. Status post right total knee replacement.   PROCEDURE:  Left total knee replacement.   HISTORY OF PRESENT ILLNESS:  The patient is a 60 year old white female with  end stage degenerative joint disease of her left knee.  She had recently  fallen with a recent exacerbation of excruciating pain in the left knee.  She has failed conservative treatment.  She is now indicated for a left  total knee replacement.   HOSPITAL COURSE:  A 60 year old female admitted on 12/04/01, after appropriate  laboratory studies were obtained, as well as 1 g of Ancef IV on call to the  operating room.  She was taken to the operating room where she underwent a  left total knee replacement.  She tolerated the procedure well.  Placed on a  PCA Dilaudid pump for pain control.  Heparin 5000 units subcutaneously  q.12h. until the Coumadin became therapeutic.  Consultations with physical  therapy and occupational therapy were made.  Physical therapy for ambulation  weightbearing as tolerated on the left.  CPM was placed from 0 to 30  degrees, and incremented by 10 degrees a day.  She did have some difficulty  with spasms postoperatively, and was placed on Robaxin 500 mg q.6h. p.r.n.  Mild temperature elevation was controlled with increasing incentive  spirometry.  Nausea was controlled with  Reglan.  The remainder of her  hospital course was uneventful.  Her drains were pulled on postoperative day  #2, and dressing was changed showing a benign wound.  She was discharge don  12/08/01, to return back to the office in 7 to 10 days for followup.  EKG was  normal sinus rhythm.  Left knee postoperatively revealed left total knee  replacement with prosthetic components in good position and alignment.  Chest x-ray from 03/13/99, showed no active disease.   LABORATORY DATA:  Preoperatively, hemoglobin 13.8, hematocrit 40.8%, white  count 7400, platelets 336,000.  Discharge hemoglobin 9.3, hematocrit 27.2%.  Chemistries preoperatively:  Sodium 138, potassium 4.6, chloride 104, CO2  26, glucose 105, BUN 10, creatinine 0.6, calcium 9.4, total protein 6.9,  albumin 4.0, AST 21, ALT 26, ALP 67, total bilirubin 0.6.  Discharge sodium  136, potassium 3.6, chloride 104, CO2 26, glucose 121, BUN 4,  creatinine  0.6, calcium 8.3.  Glycosylated hemoglobin of 12/07/01 was 5.4.  Urinalysis  was benign for a voided urine.  Blood type O+, antibody screen negative.   DISCHARGE MEDICATIONS:  1. Percocet 5/325 mg one or two tabs q.4h. p.r.n. pain.  2. Coumadin 5 mg tablets as directed by [facility].  She will begin     with one tablet daily.  3. Colace 100 mg b.i.d. as a stool softener.  4. Iron sulfate 325 mg with a meal.   ACTIVITY:  She will be up as tolerated as taught in physical therapy.  Total  knee protocols.   DIET:  No restrictions.   WOUND CARE:  Keep the wound clean and dry.  May shower over the incision.   FOLLOWUP:  With [place] in 10 days for followup.   CONDITION ON DISCHARGE:  Improved.       Oris Drone Petrarca, P.A.-C.                Loreta Ave, M.D.    BDP/MEDQ  D:  12/17/2001  T:  12/20/2001  Job:  [id number]

## 2010-09-13 ENCOUNTER — Telehealth: Payer: Self-pay | Admitting: *Deleted

## 2010-09-13 NOTE — Telephone Encounter (Signed)
[name] Strickland woke up this morning with sinus congestion, sore throat, slight non productive cough.  She has checked her temp and it is 99.1.  Leaving for vacation Saturday morning, is there anything she should do?  Took a zyrtec this morning.

## 2010-09-13 NOTE — Telephone Encounter (Signed)
Drink lots of fluids.  Treat sympotmatically with Mucinex, nasal saline irrigation, and Tylenol/Ibuprofen. Also try claritin D or zyrtec D over the counter- two times a day as needed.  Cough suppressant at night.

## 2010-09-13 NOTE — Telephone Encounter (Signed)
Noted! Thank you

## 2010-09-13 NOTE — Telephone Encounter (Signed)
Patient advised as instructed via telephone.  She stated that she will just take regular Zyrtec or Claritin because with the decongestant makes her heart beat faster.

## 2010-12-14 ENCOUNTER — Other Ambulatory Visit: Payer: Self-pay | Admitting: *Deleted

## 2010-12-14 MED ORDER — ALPRAZOLAM 0.5 MG PO TABS: 0.5000 mg | ORAL_TABLET | Freq: Every evening | ORAL | Status: DC | PRN

## 2010-12-14 NOTE — Telephone Encounter (Signed)
Patient is requesting a refill.  She is going through a lot right now with her spouse and other issues.  Please advise.

## 2010-12-14 NOTE — Telephone Encounter (Signed)
Rx called to [facility].

## 2011-01-11 ENCOUNTER — Telehealth: Payer: Self-pay | Admitting: Internal Medicine

## 2011-01-11 NOTE — Telephone Encounter (Signed)
Woke up at 4:30am she had had a bad dream  She realized that her HR was up  HR was 130 at rest and she took her am medications 4:45 and then her HR slowed to 93 resting when she would get up to go to BR HR up 120 -130  Then she converted at 11:45am at a HR of 56-60  This is her normal  She has an appointment for Wed at 3:45  I told her she can take an extra Flecainde 50mg  if needed when out of rhythm  She will keep her appointment as scheduled

## 2011-01-11 NOTE — Telephone Encounter (Signed)
Pt calling stating that she had a episode of a-fib and tachycardia last night and wanted to speak w/ nurse regarding it. This is the first time this has happened since pt was in hospital last in Feb/March 12'. Please return pt call to discuss further.

## 2011-01-11 NOTE — Telephone Encounter (Signed)
Pt calling back to fu from message this am, thought she would have heard from nurse by now and doesn't want a call back at this time

## 2011-01-16 ENCOUNTER — Encounter: Payer: Self-pay | Admitting: Internal Medicine

## 2011-01-16 ENCOUNTER — Ambulatory Visit (INDEPENDENT_AMBULATORY_CARE_PROVIDER_SITE_OTHER): Payer: 59 | Admitting: Internal Medicine

## 2011-01-16 VITALS — BP 132/88 | HR 67 | Ht 68.0 in | Wt 270.5 lb

## 2011-01-16 DIAGNOSIS — R030 Elevated blood-pressure reading, without diagnosis of hypertension: Secondary | ICD-10-CM

## 2011-01-16 DIAGNOSIS — I4891 Unspecified atrial fibrillation: Secondary | ICD-10-CM

## 2011-01-16 MED ORDER — DILTIAZEM HCL ER COATED BEADS 120 MG PO CP24: 120.0000 mg | ORAL_CAPSULE | Freq: Every day | ORAL | Status: DC

## 2011-01-16 MED ORDER — FLECAINIDE ACETATE 100 MG PO TABS: 100.0000 mg | ORAL_TABLET | Freq: Two times a day (BID) | ORAL | Status: DC

## 2011-01-16 MED ORDER — DABIGATRAN ETEXILATE MESYLATE 150 MG PO CAPS: 150.0000 mg | ORAL_CAPSULE | Freq: Two times a day (BID) | ORAL | Status: DC

## 2011-01-16 NOTE — Assessment & Plan Note (Signed)
Her blood pressure today is only slightly elevated. I discussed the importance of a low-sodium diet, weight loss, and regular daily exercise.

## 2011-01-16 NOTE — Progress Notes (Signed)
HPI [name] Strickland returns today for followup. She is a very pleasant 60 year old woman with a history of paroxysmal atrial fibrillation, hypertension, and morbid obesity. The patient was placed on flecainide several months ago for control of her atrial fibrillation. She has done quite well. She describes one episode of palpitations lasting approximately 6 hours which was likely due to fibrillation. She denies noncompliance with her medication. Overall she feels well but notes that she has been unable to reduce her weight much secondary to stress at home. Her husband has been critically ill for many months with advanced chronic congestive heart failure. The patient denies syncope. She has minimal peripheral edema. Allergies  Allergen Reactions  . Shellfish-Derived Products   . Sulfonamide Derivatives     REACTION: Rash, itching.     Current Outpatient Prescriptions  Medication Sig Dispense Refill  . ALPRAZolam (XANAX) 0.5 MG tablet Take 1 tablet (0.5 mg total) by mouth at bedtime as needed.  30 tablet  0  . aspirin 81 MG EC tablet Take 81 mg by mouth daily.        . dabigatran ([name]) 150 MG CAPS Take 1 capsule (150 mg total) by mouth every 12 (twelve) hours.  60 capsule  6  . diltiazem (CARDIZEM CD) 120 MG 24 hr capsule Take 1 capsule (120 mg total) by mouth daily.  30 capsule  6  . flecainide (TAMBOCOR) 100 MG tablet Take 1 tablet (100 mg total) by mouth 2 (two) times daily.  60 tablet  6     Past Medical History  Diagnosis Date  . Hypertension   . Diabetes mellitus   . Dyslipidemia   . Atrial fibrillation     ROS:   All systems reviewed and negative except as noted in the HPI.   Past Surgical History  Procedure Date  . Appendectomy   . Tonsillectomy and adenoidectomy   . Knee surgery   . Replacement total knee     bilateral, different times  . Breast lumpectomy      Family History  Problem Relation Age of Onset  . Heart attack Mother   . Heart disease Father     cabg  x 7  . Heart attack Father   . Stroke Father      History   Social History  . Marital Status: Married    Spouse Name: [name] Strickland    Number of Children: [name] Strickland  . Years of Education: [name] Strickland   Occupational History  . Not on file.   Social History Main Topics  . Smoking status: Never Smoker   . Smokeless tobacco: Never Used  . Alcohol Use: No  . Drug Use: No  . Sexually Active:    Other Topics Concern  . Not on file   Social History Narrative  . No narrative on file     BP 132/88  Pulse 67  Ht 5\' 8"  (1.727 m)  Wt 270 lb 8 oz (122.698 kg)  BMI 41.13 kg/m2  Physical Exam:  Well appearing morbidly obese, middle-aged woman, NAD HEENT: Unremarkable Neck:  No JVD, no thyromegally Lymphatics:  No adenopathy Back:  No CVA tenderness Lungs:  Clear with no wheezes, rales, or rhonchi. HEART:  Regular rate rhythm, no murmurs, no rubs, no clicks. Heart sounds are distant Abd:  Obese, soft, positive bowel sounds, no organomegally, no rebound, no guarding Ext:  2 plus pulses, no edema, no cyanosis, no clubbing Skin:  No rashes no nodules Neuro:  CN II through XII intact, motor  grossly intact  EKG Normal sinus rhythm with nonspecific inferior and anterior ST-T wave abnormality  Assess/Plan:

## 2011-01-16 NOTE — Assessment & Plan Note (Signed)
She has remained stable on her current medical therapy. I've asked that she continue flecainide and calcium channel blockers. I'll see her back in 6 months.

## 2011-02-20 ENCOUNTER — Telehealth: Payer: Self-pay | Admitting: Internal Medicine

## 2011-02-20 NOTE — Telephone Encounter (Signed)
Follow-up:   Patient called again to speak with Tresa Endo. Please call back.

## 2011-02-20 NOTE — Telephone Encounter (Signed)
New Msg: Pt calling stating that in Feb pt was in hospital with tachycardia and a-fib. Pt saw Dr. Ladona Ridgel in October for episode. This week pt has had two episodes and is having an episode now. Rest Hr 100-110 Earlier this am pt was in a-fib, normally when a-fib stops tachycardia goes away. This time a-fib stopped about 30-40 mins ago but pt is still having fast HR. Pt wants to know if she needs to do differently. Pt said last time she was told to take 1/2 flecainide. Please return pt call to discuss further and instruct how to proceed. Please return pt call to discuss further.

## 2011-02-20 NOTE — Telephone Encounter (Signed)
Talked with patient and let her know she needs to continue to her medications as directed She will keep up with how often she is going out of rhythm and let me know in a few weeks  Hospice has been called in for her husband and she has been under a lot of stress with that

## 2011-02-20 NOTE — Telephone Encounter (Signed)
FU Call: Pt returning call to [facility]. Please return pt call to discuss further.

## 2011-04-17 ENCOUNTER — Ambulatory Visit (INDEPENDENT_AMBULATORY_CARE_PROVIDER_SITE_OTHER): Payer: 59 | Admitting: Family Medicine

## 2011-04-17 ENCOUNTER — Encounter: Payer: Self-pay | Admitting: Family Medicine

## 2011-04-17 ENCOUNTER — Telehealth: Payer: Self-pay | Admitting: *Deleted

## 2011-04-17 VITALS — BP 140/94 | HR 75 | Temp 97.6°F

## 2011-04-17 DIAGNOSIS — I4891 Unspecified atrial fibrillation: Secondary | ICD-10-CM

## 2011-04-17 MED ORDER — FLECAINIDE ACETATE 100 MG PO TABS: 100.0000 mg | ORAL_TABLET | Freq: Two times a day (BID) | ORAL | Status: DC

## 2011-04-17 MED ORDER — ALPRAZOLAM 0.5 MG PO TABS: 0.5000 mg | ORAL_TABLET | Freq: Every evening | ORAL | Status: DC | PRN

## 2011-04-17 NOTE — Progress Notes (Signed)
HPI  61 yo here for afib. LPN in our office.  Was bringing a patient to the room this morning and felt her heart race. Checked her pulse and it was 128. Became short of breath, rechecked and it was 130 and irregular.   Her cardiologist, Dr. Ladona Ridgel was placed her on flecainide several months ago for control of her atrial fibrillation. She has done quite well but does have recurrent episodes, this was first one since October.  Per pt, she was told to take an extra flecainide when this happens.  She converted to regular rhythm on her own prior to me seeing her.  EKG today shows some ST changes, appears to be unchanged from prior.  SOB has resolved.  No CP, no dizziness.    Allergies  Allergen Reactions  . Shellfish-Derived Products   . Sulfonamide Derivatives     REACTION: Rash, itching.     Current Outpatient Prescriptions  Medication Sig Dispense Refill  . ALPRAZolam (XANAX) 0.5 MG tablet Take 1 tablet (0.5 mg total) by mouth at bedtime as needed.  30 tablet  0  . aspirin 81 MG EC tablet Take 81 mg by mouth daily.        . dabigatran ([name]) 150 MG CAPS Take 1 capsule (150 mg total) by mouth every 12 (twelve) hours.  60 capsule  6  . diltiazem (CARDIZEM CD) 120 MG 24 hr capsule Take 1 capsule (120 mg total) by mouth daily.  30 capsule  6  . flecainide (TAMBOCOR) 100 MG tablet Take 1 tablet (100 mg total) by mouth 2 (two) times daily.  2 tablet  0     Past Medical History  Diagnosis Date  . Hypertension   . Diabetes mellitus   . Dyslipidemia   . Atrial fibrillation     ROS:   All systems reviewed and negative except as noted in the HPI.   Past Surgical History  Procedure Date  . Appendectomy   . Tonsillectomy and adenoidectomy   . Knee surgery   . Replacement total knee     bilateral, different times  . Breast lumpectomy      Family History  Problem Relation Age of Onset  . Heart attack Mother   . Heart disease Father     cabg x 7  . Heart attack  Father   . Stroke Father      History   Social History  . Marital Status: Married    Spouse Name: N/A    Number of Children: N/A  . Years of Education: N/A   Occupational History  . Not on file.   Social History Main Topics  . Smoking status: Never Smoker   . Smokeless tobacco: Never Used  . Alcohol Use: No  . Drug Use: No  . Sexually Active:    Other Topics Concern  . Not on file   Social History Narrative  . No narrative on file    Physical Exam: BP 140/94  Pulse 75  Temp(Src) 97.6 F (36.4 C) (Oral)  Well appearing morbidly obese, middle-aged woman, NAD HEENT: Unremarkable Neck:  No JVD, no thyromegally Lymphatics:  No adenopathy Back:  No CVA tenderness Lungs:  Clear with no wheezes, rales, or rhonchi. HEART:  Regular rate rhythm, no murmurs, no rubs, no clicks. Heart sounds are distant Abd:  Obese, soft, positive bowel sounds, no organomegally, no rebound, no guarding Ext:  2 plus pulses, no edema, no cyanosis, no clubbing Skin:  No rashes no nodules  Neuro:  CN II through XII intact, motor grossly intact   Assessment and Plan:  1. Atrial fibrillation    Deteriorated. Converted on her own.  Spoke with Dr. Graciela Husbands, doc of the day, he will discuss with Dr. Ladona Ridgel but likely does not need to see cardiology right away. Advised to also d/c ASA.  I have informed pt and removed it from her med list.

## 2011-04-17 NOTE — Telephone Encounter (Signed)
Noted  

## 2011-04-17 NOTE — Telephone Encounter (Signed)
Patient went in Afib after coming to work is short of breathe needs flecainide called to [facility] pharmacy b/c she left hers at home.

## 2011-04-17 NOTE — Telephone Encounter (Signed)
Rx for Alprazolam called to [facility], patient notified.

## 2011-04-17 NOTE — Telephone Encounter (Signed)
Rx for Flecainide sent to [facility] per Dr. Elmer Sow request.

## 2011-04-25 ENCOUNTER — Other Ambulatory Visit: Payer: Self-pay | Admitting: Internal Medicine

## 2011-04-25 ENCOUNTER — Other Ambulatory Visit: Payer: Self-pay

## 2011-04-25 DIAGNOSIS — R030 Elevated blood-pressure reading, without diagnosis of hypertension: Secondary | ICD-10-CM

## 2011-04-25 MED ORDER — DILTIAZEM HCL ER COATED BEADS 120 MG PO CP24: 120.0000 mg | ORAL_CAPSULE | Freq: Every day | ORAL | Status: DC

## 2011-04-26 ENCOUNTER — Other Ambulatory Visit: Payer: Self-pay | Admitting: Internal Medicine

## 2011-10-16 ENCOUNTER — Other Ambulatory Visit: Payer: Self-pay | Admitting: Internal Medicine

## 2011-11-27 ENCOUNTER — Other Ambulatory Visit: Payer: Self-pay | Admitting: *Deleted

## 2011-11-27 MED ORDER — ALPRAZOLAM 0.5 MG PO TABS: 0.5000 mg | ORAL_TABLET | Freq: Every evening | ORAL | Status: DC | PRN

## 2011-11-27 NOTE — Telephone Encounter (Signed)
Medicine called to pharmacy. 

## 2011-11-28 ENCOUNTER — Other Ambulatory Visit: Payer: Self-pay | Admitting: Internal Medicine

## 2012-01-21 ENCOUNTER — Ambulatory Visit (INDEPENDENT_AMBULATORY_CARE_PROVIDER_SITE_OTHER): Payer: 59 | Admitting: Internal Medicine

## 2012-01-21 ENCOUNTER — Encounter: Payer: Self-pay | Admitting: Internal Medicine

## 2012-01-21 VITALS — BP 140/80 | HR 63 | Ht 68.0 in | Wt 269.5 lb

## 2012-01-21 DIAGNOSIS — I4891 Unspecified atrial fibrillation: Secondary | ICD-10-CM

## 2012-01-21 DIAGNOSIS — R030 Elevated blood-pressure reading, without diagnosis of hypertension: Secondary | ICD-10-CM

## 2012-01-21 MED ORDER — FLECAINIDE ACETATE 100 MG PO TABS: 100.0000 mg | ORAL_TABLET | Freq: Two times a day (BID) | ORAL | Status: DC

## 2012-01-21 MED ORDER — DILTIAZEM HCL ER COATED BEADS 120 MG PO CP24: 120.0000 mg | ORAL_CAPSULE | Freq: Every day | ORAL | Status: DC

## 2012-01-21 MED ORDER — DABIGATRAN ETEXILATE MESYLATE 150 MG PO CAPS: 150.0000 mg | ORAL_CAPSULE | Freq: Two times a day (BID) | ORAL | Status: DC

## 2012-01-21 NOTE — Assessment & Plan Note (Signed)
The patient is maintaining sinus rhythm very nicely. She will continue her current dose of flecainide, and take additional doses if she has rebound or breakthrough episodes of atrial fibrillation.

## 2012-01-21 NOTE — Progress Notes (Signed)
HPI [name] Strickland returns today for followup. She is a very pleasant 61 year old woman with a history of paroxysmal atrial fibrillation, hypertension, and obesity. She has done well in the interim from the perspective of her atrial fibrillation. She denies chest pain, shortness of breath, peripheral edema. No syncope. She notes one episode of atrial fibrillation which occurred 2 months ago, for which she took extra flecainide, and noted return of her heart normal rhythm. She does note that she has been under increased stress because she helps care for her husband who has advanced heart failure. Allergies  Allergen Reactions  . Shellfish-Derived Products   . Sulfonamide Derivatives     REACTION: Rash, itching.     Current Outpatient Prescriptions  Medication Sig Dispense Refill  . ALPRAZolam (XANAX) 0.5 MG tablet Take 1 tablet (0.5 mg total) by mouth at bedtime as needed.  30 tablet  0  . dabigatran ([name]) 150 MG CAPS Take 1 capsule (150 mg total) by mouth every 12 (twelve) hours.  60 capsule  6  . diltiazem (CARDIZEM CD) 120 MG 24 hr capsule Take 1 capsule (120 mg total) by mouth daily.  30 capsule  6  . flecainide (TAMBOCOR) 100 MG tablet Take 1 tablet (100 mg total) by mouth 2 (two) times daily.  60 tablet  6  . DISCONTD: dabigatran ([name]) 150 MG CAPS Take 1 capsule (150 mg total) by mouth every 12 (twelve) hours.  60 capsule  6  . DISCONTD: diltiazem (CARDIZEM CD) 120 MG 24 hr capsule Take 1 capsule (120 mg total) by mouth daily.  30 capsule  1  . DISCONTD: flecainide (TAMBOCOR) 100 MG tablet Take 1 tablet (100 mg total) by mouth 2 (two) times daily.  2 tablet  0  . DISCONTD: flecainide (TAMBOCOR) 100 MG tablet TAKE 1 TABLET BY MOUTH 2 TIMES DAILY  60 tablet  0     Past Medical History  Diagnosis Date  . Hypertension   . Diabetes mellitus   . Dyslipidemia   . Atrial fibrillation     ROS:   All systems reviewed and negative except as noted in the HPI.   Past Surgical History    Procedure Date  . Appendectomy   . Tonsillectomy and adenoidectomy   . Knee surgery   . Replacement total knee     bilateral, different times  . Breast lumpectomy      Family History  Problem Relation Age of Onset  . Heart attack Mother   . Heart disease Father     cabg x 7  . Heart attack Father   . Stroke Father      History   Social History  . Marital Status: Married    Spouse Name: N/A    Number of Children: N/A  . Years of Education: N/A   Occupational History  . Not on file.   Social History Main Topics  . Smoking status: Never Smoker   . Smokeless tobacco: Never Used  . Alcohol Use: No  . Drug Use: No  . Sexually Active:    Other Topics Concern  . Not on file   Social History Narrative  . No narrative on file     BP 140/80  Pulse 63  Ht 5\' 8"  (1.727 m)  Wt 269 lb 8 oz (122.244 kg)  BMI 40.98 kg/m2  Physical Exam:  Well appearing obese, middle-age woman, NAD HEENT: Unremarkable Neck:  No JVD, no thyromegally Lungs:  Clear with no wheezes, rales, or rhonchi.  HEART:  Regular rate rhythm, no murmurs, no rubs, no clicks Abd:  soft, positive bowel sounds, no organomegally, no rebound, no guarding Ext:  2 plus pulses, no edema, no cyanosis, no clubbing Skin:  No rashes no nodules Neuro:  CN II through XII intact, motor grossly intact  EKG Normal sinus rhythm with normal axis and intervals  Assess/Plan:

## 2012-01-21 NOTE — Patient Instructions (Addendum)
Your physician wants you to follow-up in: ONE YEAR WITH DR TAYLOR You will receive a reminder letter in the mail two months in advance. If you don't receive a letter, please call our office to schedule the follow-up appointment.  

## 2012-01-21 NOTE — Assessment & Plan Note (Signed)
Her blood pressure today is elevated. I do suspect she has hypertension. She will continue her current medical therapy, and I've asked the patient to try and lose weight, and maintain a low-sodium diet.

## 2012-04-27 ENCOUNTER — Encounter: Payer: Self-pay | Admitting: Family Medicine

## 2012-04-27 ENCOUNTER — Ambulatory Visit (INDEPENDENT_AMBULATORY_CARE_PROVIDER_SITE_OTHER): Payer: 59 | Admitting: Family Medicine

## 2012-04-27 VITALS — BP 120/90 | HR 60 | Temp 98.4°F

## 2012-04-27 DIAGNOSIS — K047 Periapical abscess without sinus: Secondary | ICD-10-CM

## 2012-04-27 MED ORDER — OXYCODONE-ACETAMINOPHEN 10-325 MG PO TABS: 1.0000 | ORAL_TABLET | ORAL | Status: DC | PRN

## 2012-04-27 MED ORDER — CLINDAMYCIN HCL 300 MG PO CAPS: 600.0000 mg | ORAL_CAPSULE | Freq: Three times a day (TID) | ORAL | Status: AC

## 2012-04-27 MED ORDER — HYDROCODONE-ACETAMINOPHEN 10-500 MG PO TABS: 1.0000 | ORAL_TABLET | Freq: Four times a day (QID) | ORAL | Status: DC | PRN

## 2012-04-27 MED ORDER — CLINDAMYCIN HCL 300 MG PO CAPS: 600.0000 mg | ORAL_CAPSULE | Freq: Three times a day (TID) | ORAL | Status: DC

## 2012-04-27 NOTE — Progress Notes (Signed)
  Subjective:    Patient ID: [name] Strickland, female    DOB: 11-08-1950, 62 y.o.   MRN: [id number]  HPI  62 yo here for acute onset of right cheek pain and swelling.  Has multiple broken teeth and dental carries but teeth were not hurting more than usual until right before the swelling started.  No eye pain or blurry vision. No difficulty swallowing.  No fevers, chills, nausea or vomiting.  Patient Active Problem List  Diagnosis  . Atrial fibrillation  . BACK PAIN  . ELEVATED BLOOD PRESSURE WITHOUT DIAGNOSIS OF HYPERTENSION   Past Medical History  Diagnosis Date  . Hypertension   . Diabetes mellitus   . Dyslipidemia   . Atrial fibrillation    Past Surgical History  Procedure Date  . Appendectomy   . Tonsillectomy and adenoidectomy   . Knee surgery   . Replacement total knee     bilateral, different times  . Breast lumpectomy    History  Substance Use Topics  . Smoking status: Never Smoker   . Smokeless tobacco: Never Used  . Alcohol Use: No   Family History  Problem Relation Age of Onset  . Heart attack Mother   . Heart disease Father     cabg x 7  . Heart attack Father   . Stroke Father    Allergies  Allergen Reactions  . Shellfish-Derived Products   . Sulfonamide Derivatives     REACTION: Rash, itching.   Current Outpatient Prescriptions on File Prior to Visit  Medication Sig Dispense Refill  . ALPRAZolam (XANAX) 0.5 MG tablet Take 1 tablet (0.5 mg total) by mouth at bedtime as needed.  30 tablet  0  . dabigatran ([name]) 150 MG CAPS Take 1 capsule (150 mg total) by mouth every 12 (twelve) hours.  60 capsule  6  . diltiazem (CARDIZEM CD) 120 MG 24 hr capsule Take 1 capsule (120 mg total) by mouth daily.  30 capsule  6  . flecainide (TAMBOCOR) 100 MG tablet Take 1 tablet (100 mg total) by mouth 2 (two) times daily.  60 tablet  6   The PMH, PSH, Social History, Family History, Medications, and allergies have been reviewed in [facility], and have been updated  if relevant.   Review of Systems    See HPI Objective:   Physical Exam BP 120/90  Pulse 60  Temp 98.4 F (36.9 C) Gen:  Alert, pleasant, NAD HEENT: Right cheek swelling and tenderness, slight warmth Multiple dental carries with probable dental abscess right upper molar No lymphadenopathy     Assessment & Plan:  1.  Dental abscess-  New- Start Clindamycin 600 mg three times daily x 10 days. Vicodin as needed for pain. Dental appointment ASAP. The patient indicates understanding of these issues and agrees with the plan.

## 2012-04-27 NOTE — Patient Instructions (Signed)
Please start taking Clindamycin 600 mg three times daily for next 10 days.  PLEASE make an appointment with a dentist immediately.  Please keep me posted.

## 2012-05-11 ENCOUNTER — Telehealth: Payer: Self-pay | Admitting: *Deleted

## 2012-05-11 NOTE — Telephone Encounter (Signed)
Pt calling needs to know if she needs to hold [name] for dental procedure. If so how many days. Dentist needs written note faxed [phone]

## 2012-05-11 NOTE — Telephone Encounter (Signed)
Ok to hold [name] for dental procedure?

## 2012-05-12 ENCOUNTER — Telehealth: Payer: Self-pay | Admitting: *Deleted

## 2012-05-12 MED ORDER — CLINDAMYCIN HCL 300 MG PO CAPS: 600.0000 mg | ORAL_CAPSULE | Freq: Three times a day (TID) | ORAL | Status: AC

## 2012-05-12 NOTE — Telephone Encounter (Signed)
Ok to refill.  Rx sent to [facility].

## 2012-05-12 NOTE — Telephone Encounter (Signed)
Patient has finished all of her antibiotics for her tooth has appt on Thursday to get it fixed but it is starting to hurt and swell again and want to know if she can have a refill?

## 2012-05-19 ENCOUNTER — Telehealth: Payer: Self-pay

## 2012-05-19 NOTE — Telephone Encounter (Signed)
Pt called back to say she never heard response [id number] so she decided to begin holding pradaxa on her own 2/14 She is at dentist now getting ready to have dental work done Since she took it upon herself to hold pradaxa on her own, she needs a note to go to dentist saying ok to proceed with procedure since she has been off pradaxa at least 2 days. Letter faxed to # provided

## 2012-05-19 NOTE — Telephone Encounter (Signed)
Ok to hold 3 days before procedure. Start back 2 days after.

## 2012-05-20 NOTE — Telephone Encounter (Signed)
Attempted to reach pt via mobile # and it states "patient is not accepting calls at this time. PLease try your call again later" I then attempted to reach pt at work number; no answer, was placed on hold for a long period of time Will try again later

## 2012-05-20 NOTE — Telephone Encounter (Signed)
Pt informed She will resume pradaxa Friday 05/22/12 She also says they were unable to pull tooth yesterday d/t inability to numb pt after "10 sticks" She is now being referred to oral surgeon and will need another note faxed to her at [phone] stating ok to hold [name] if she needs to have another procedure. I will fax this now

## 2012-06-04 ENCOUNTER — Other Ambulatory Visit: Payer: Self-pay | Admitting: Internal Medicine

## 2012-06-04 NOTE — Telephone Encounter (Signed)
Refilled Diltiazem #90 Refill#3 sent to [facility]

## 2012-12-10 ENCOUNTER — Ambulatory Visit (INDEPENDENT_AMBULATORY_CARE_PROVIDER_SITE_OTHER): Payer: 59 | Admitting: Family Medicine

## 2012-12-10 ENCOUNTER — Encounter: Payer: Self-pay | Admitting: Family Medicine

## 2012-12-10 VITALS — BP 150/72 | HR 56 | Temp 98.1°F

## 2012-12-10 DIAGNOSIS — T50995A Adverse effect of other drugs, medicaments and biological substances, initial encounter: Secondary | ICD-10-CM

## 2012-12-10 DIAGNOSIS — R030 Elevated blood-pressure reading, without diagnosis of hypertension: Secondary | ICD-10-CM

## 2012-12-10 DIAGNOSIS — T50B95A Adverse effect of other viral vaccines, initial encounter: Secondary | ICD-10-CM | POA: Insufficient documentation

## 2012-12-10 NOTE — Progress Notes (Signed)
  Subjective:    Patient ID: [name] Strickland, female    DOB: [birth year], 62 y.o.   MRN: [id number]  HPI 62 yo very pleasant female with h/o a fib here for ? adverse reaction to flu shot.  Given flu shot (quadrivalent) this morning.  Immediately felt some itching and warmth around injection site.  Then started to feel light headed and nauseated.  RN checked BP and it was 162/96.  Sent over here to see me immediately.  Has taken Benadryl.  Feels better now.  Itching has resolved.  No longer feels dizzy or light headed. No CP or SOB.  Does feel a little "whoozy" but states she always feels like this after she has taken Benadryl.  Patient Active Problem List   Diagnosis Date Noted  . Adverse reaction to influenza vaccine 12/10/2012  . BACK PAIN 07/17/2009  . Atrial fibrillation 01/02/2009  . ELEVATED BLOOD PRESSURE WITHOUT DIAGNOSIS OF HYPERTENSION 01/02/2009   Past Medical History  Diagnosis Date  . Hypertension   . Diabetes mellitus   . Dyslipidemia   . Atrial fibrillation    Past Surgical History  Procedure Laterality Date  . Appendectomy    . Tonsillectomy and adenoidectomy    . Knee surgery    . Replacement total knee      bilateral, different times  . Breast lumpectomy     History  Substance Use Topics  . Smoking status: Never Smoker   . Smokeless tobacco: Never Used  . Alcohol Use: No   Family History  Problem Relation Age of Onset  . Heart attack Mother   . Heart disease Father     cabg x 7  . Heart attack Father   . Stroke Father    Allergies  Allergen Reactions  . Shellfish-Derived Products   . Sulfonamide Derivatives     REACTION: Rash, itching.   Current Outpatient Prescriptions on File Prior to Visit  Medication Sig Dispense Refill  . ALPRAZolam (XANAX) 0.5 MG tablet Take 1 tablet (0.5 mg total) by mouth at bedtime as needed.  30 tablet  0  . dabigatran ([name]) 150 MG CAPS Take 1 capsule (150 mg total) by mouth every 12 (twelve) hours.  60  capsule  6  . diltiazem (CARDIZEM CD) 120 MG 24 hr capsule TAKE 1 CAPSULE BY MOUTH DAILY  90 capsule  3  . flecainide (TAMBOCOR) 100 MG tablet Take 1 tablet (100 mg total) by mouth 2 (two) times daily.  60 tablet  6   No current facility-administered medications on file prior to visit.   The PMH, PSH, Social History, Family History, Medications, and allergies have been reviewed in [facility], and have been updated if relevant.      Review of Systems    See HPI No vomiting No CP No SOB Objective:   Physical Exam BP 150/72  Pulse 56  Temp(Src) 98.1 F (36.7 C) Gen:  Alert, pleasant, NAD No increased WOB Skin: Neck a little flushed Injection site looks- ?mild erythema around site, no warmth Resp:  CTA bilaterally CVS:  Bradycardia, RR    Assessment & Plan:  1. ELEVATED BLOOD PRESSURE WITHOUT DIAGNOSIS OF HYPERTENSION Improving, likely due to adverse rxn from influenza vaccine. - EKG 12-Lead  2. Adverse reaction to influenza vaccine, initial encounter New- resolving.  Vital signs improving and she is feeling better. Reported this to Facilities manager and office manager.  [facility] is aware of red flag symptoms requiring evaluation.

## 2013-01-19 ENCOUNTER — Other Ambulatory Visit: Payer: Self-pay | Admitting: Internal Medicine

## 2013-02-11 ENCOUNTER — Encounter: Payer: Self-pay | Admitting: *Deleted

## 2013-02-12 ENCOUNTER — Telehealth: Payer: Self-pay | Admitting: Internal Medicine

## 2013-02-12 NOTE — Telephone Encounter (Signed)
New message     C/o pulse rate is 106---at 5:30 am it was 230.  Took an extra flecainde at 10am.  No chest pain or sob now  Need advise on what to do for the weekend.

## 2013-02-12 NOTE — Telephone Encounter (Signed)
Patient states she experienced breakthrough rapid heart rate at 0530 (HR 230). She denied SOB or CP. At 1000, HR 106, so she took extra dose Flecainide.  At this time, her HR is 73. Denies current SOB or CP. Advised that Dr. Ladona Ridgel had instructed her at last office visit to take extra dose of Flecainide for breakthrough events, so she did the correct thing. Advised her that she should not take more than 300 mg in a day though. She stated she was aware of that and agreed. Advised her that should the extra dose of Flecainide prove ineffective or if she became symptomatic during an episode, she should proceed to call 911/seek emergent care. Cautioned her against driving with elevated HR or symptoms of dizziness, SOB, CP. Patient verbalized understanding and agreement.

## 2013-02-16 ENCOUNTER — Other Ambulatory Visit: Payer: Self-pay | Admitting: *Deleted

## 2013-02-16 NOTE — Telephone Encounter (Signed)
Patient asks if you would be willing to refill her Alprazolam.  Her PCP is Dr. Dayton Martes and Nicki Reaper does not know her.  [facility].  Please advise.

## 2013-02-17 ENCOUNTER — Other Ambulatory Visit: Payer: Self-pay | Admitting: Family Medicine

## 2013-02-17 MED ORDER — ALPRAZOLAM 0.5 MG PO TABS: 0.5000 mg | ORAL_TABLET | Freq: Every evening | ORAL | Status: DC | PRN

## 2013-02-17 NOTE — Telephone Encounter (Signed)
Please call in

## 2013-02-17 NOTE — Telephone Encounter (Signed)
Ok to phone in.

## 2013-02-17 NOTE — Telephone Encounter (Signed)
Phoned in to pharmacy. 

## 2013-02-17 NOTE — Telephone Encounter (Signed)
Received refill request electronically. Last office visit 12/10/12. Is it okay to refill medication?

## 2013-02-17 NOTE — Telephone Encounter (Signed)
Spoke to pharmacist and was advised that this was called in earlier by Carlena Sax on behalf of Nicki Reaper NP.

## 2013-02-23 ENCOUNTER — Ambulatory Visit (INDEPENDENT_AMBULATORY_CARE_PROVIDER_SITE_OTHER): Payer: 59 | Admitting: Internal Medicine

## 2013-02-23 ENCOUNTER — Encounter: Payer: Self-pay | Admitting: Internal Medicine

## 2013-02-23 VITALS — BP 168/88 | HR 50 | Wt 260.0 lb

## 2013-02-23 DIAGNOSIS — I4891 Unspecified atrial fibrillation: Secondary | ICD-10-CM

## 2013-02-23 NOTE — Patient Instructions (Signed)
Your physician wants you to follow-up in: 6 months with Dr Taylor You will receive a reminder letter in the mail two months in advance. If you don't receive a letter, please call our office to schedule the follow-up appointment.  

## 2013-02-23 NOTE — Progress Notes (Signed)
HPI [name] Strickland returns today for followup. She is a very pleasant 62 year old woman with a history of paroxysmal atrial fibrillation, hypertension, and obesity. She has done well in the interim from the perspective of her atrial fibrillation. She denies chest pain, shortness of breath, peripheral edema. No syncope. She notes one episode of atrial fibrillation which occurred one week ago, for which she took extra flecainide, and noted return of her heart to normal rhythm. She does note that she has been under increased stress because she helps care for her husband who has advanced heart failure. Allergies  Allergen Reactions  . Shellfish-Derived Products   . Sulfonamide Derivatives     REACTION: Rash, itching.     Current Outpatient Prescriptions  Medication Sig Dispense Refill  . ALPRAZolam (XANAX) 0.5 MG tablet Take 1 tablet (0.5 mg total) by mouth at bedtime as needed.  30 tablet  0  . dabigatran ([name]) 150 MG CAPS Take 1 capsule (150 mg total) by mouth every 12 (twelve) hours.  60 capsule  6  . diltiazem (CARDIZEM CD) 120 MG 24 hr capsule TAKE 1 CAPSULE BY MOUTH DAILY  90 capsule  3  . flecainide (TAMBOCOR) 100 MG tablet TAKE 1 TABLET BY MOUTH TWICE DAILY  60 tablet  1   No current facility-administered medications for this visit.     Past Medical History  Diagnosis Date  . Hypertension   . Diabetes mellitus   . Dyslipidemia   . Atrial fibrillation     ROS:   All systems reviewed and negative except as noted in the HPI.   Past Surgical History  Procedure Laterality Date  . Appendectomy    . Tonsillectomy and adenoidectomy    . Knee surgery    . Replacement total knee      bilateral, different times  . Breast lumpectomy       Family History  Problem Relation Age of Onset  . Heart attack Mother   . Heart disease Father     cabg x 7  . Heart attack Father   . Stroke Father      History   Social History  . Marital Status: Married    Spouse Name: N/A   Number of Children: N/A  . Years of Education: N/A   Occupational History  . Not on file.   Social History Main Topics  . Smoking status: Never Smoker   . Smokeless tobacco: Never Used  . Alcohol Use: No  . Drug Use: No  . Sexual Activity:    Other Topics Concern  . Not on file   Social History Narrative  . No narrative on file     BP 168/88  Pulse 50  Wt 260 lb (117.935 kg)  Physical Exam:  Well appearing obese, middle-age woman, NAD HEENT: Unremarkable Neck:  No JVD, no thyromegally Lungs:  Clear with no wheezes, rales, or rhonchi. HEART:  Regular rate rhythm, no murmurs, no rubs, no clicks Abd:  soft, positive bowel sounds, no organomegally, no rebound, no guarding Ext:  2 plus pulses, no edema, no cyanosis, no clubbing Skin:  No rashes no nodules Neuro:  CN II through XII intact, motor grossly intact  EKG Normal sinus rhythm with normal axis and intervals  Assess/Plan:

## 2013-02-23 NOTE — Assessment & Plan Note (Signed)
Today we discussed additional treatment options. She is maintaining sinus rhythm very nicely with the occasional episode of atrial fibrillation. It does seem like her episodes are lasting longer although the frequency is not too severe. She is under increased stress with her husband who has advanced heart failure.  She will be a candidate for atrial fibrillation ablation, and I discussed this with the patient in detail. However a period of watchful waiting is recommended. If her symptoms worsen, she is instructed to call his.

## 2013-04-23 ENCOUNTER — Other Ambulatory Visit: Payer: Self-pay

## 2013-04-23 MED ORDER — FLECAINIDE ACETATE 100 MG PO TABS: ORAL_TABLET | ORAL | Status: DC

## 2013-04-23 NOTE — Telephone Encounter (Signed)
Refill sent for flecainide  

## 2013-04-30 ENCOUNTER — Telehealth: Payer: Self-pay | Admitting: Internal Medicine

## 2013-04-30 NOTE — Telephone Encounter (Signed)
Spoke with patient and she has been out of rhythm since 3am when she got up to go to the [place].  She took an additional 100mg  of Flecainide and a Xanax 0.5mg .  She went back to sleep and woke up around 6:30-7 still out HR 150 range.  Took her am medications including Cardizem, Flecainide and [name].  Went back to sleep, woke up still out or rhythm.  Hr now 60-110.  When she got up to answer the phone her HR went down.  Sounds like it is trying to convert now.  She will take an additional Flecainide now and take her pm dose with her [name] later. She is not SOB and does not feel anything other than tired.  I will touch base with her prior to me leaving tonight

## 2013-04-30 NOTE — Telephone Encounter (Deleted)
error 

## 2013-04-30 NOTE — Telephone Encounter (Signed)
Patient is having problem with tac acardia for 12 hours now, she would like to know what she should do? Please call and advise @ [phone].

## 2013-05-01 ENCOUNTER — Emergency Department: Payer: Self-pay | Admitting: Emergency Medicine

## 2013-05-01 ENCOUNTER — Telehealth: Payer: Self-pay | Admitting: Nurse Practitioner

## 2013-05-01 LAB — CBC
HCT: 48 % — ABNORMAL HIGH (ref 35.0–47.0)
HGB: 16 g/dL (ref 12.0–16.0)
MCH: 27.6 pg (ref 26.0–34.0)
MCHC: 33.4 g/dL (ref 32.0–36.0)
MCV: 83 fL (ref 80–100)
Platelet: 309 10*3/uL (ref 150–440)
RBC: 5.82 10*6/uL — ABNORMAL HIGH (ref 3.80–5.20)
RDW: 14.1 % (ref 11.5–14.5)
WBC: 10.1 10*3/uL (ref 3.6–11.0)

## 2013-05-01 LAB — TSH: Thyroid Stimulating Horm: 5.03 u[IU]/mL — ABNORMAL HIGH

## 2013-05-01 LAB — BASIC METABOLIC PANEL
ANION GAP: 4 — AB (ref 7–16)
BUN: 12 mg/dL (ref 7–18)
CALCIUM: 8.8 mg/dL (ref 8.5–10.1)
CHLORIDE: 107 mmol/L (ref 98–107)
CO2: 26 mmol/L (ref 21–32)
Creatinine: 0.75 mg/dL (ref 0.60–1.30)
EGFR (African American): >60
EGFR (Non-African Amer.): >60
Glucose: 107 mg/dL — ABNORMAL HIGH (ref 65–99)
Osmolality: 274 (ref 275–301)
POTASSIUM: 3.9 mmol/L (ref 3.5–5.1)
Sodium: 137 mmol/L (ref 136–145)

## 2013-05-01 LAB — TROPONIN I: Troponin-I: <0.02 ng/mL

## 2013-05-01 NOTE — Telephone Encounter (Signed)
Pt called yesterday 2/2 recurrent afib.  She spoke with the EP nurse and took 1 additional dose of flecainide and diltiazem.  HR throughout the day was variable but she is pretty certain that she never converted from afib.  This morning, she remains in afib and rates are 110's to 130's.  She feels fatigued and has noted mild DOE.  She has not yet taken her morning meds (flecainide 100, dilt cd 120, and pradaxa 150) and I have recommended that she do so.  If she continues to feel poorly by lunchtime or before, than I have recommended that she present to the ER for evaluation as she may require DCCV.  She verbalized understanding and will present to [facility] if she does not convert @ home this AM.

## 2013-05-03 NOTE — Telephone Encounter (Signed)
Second attempt to reach patient on her cell [phone].  It states her voicemail is not set up yet.  I will continue to try.  I tried Fri night prior to leaving and again today

## 2013-05-04 ENCOUNTER — Encounter: Payer: Self-pay | Admitting: Internal Medicine

## 2013-05-04 ENCOUNTER — Ambulatory Visit (INDEPENDENT_AMBULATORY_CARE_PROVIDER_SITE_OTHER): Payer: 59 | Admitting: Internal Medicine

## 2013-05-04 ENCOUNTER — Encounter: Payer: Self-pay | Admitting: *Deleted

## 2013-05-04 VITALS — BP 124/82 | HR 49 | Ht 68.0 in | Wt 236.6 lb

## 2013-05-04 DIAGNOSIS — I4891 Unspecified atrial fibrillation: Secondary | ICD-10-CM

## 2013-05-04 DIAGNOSIS — R0683 Snoring: Secondary | ICD-10-CM

## 2013-05-04 DIAGNOSIS — R0989 Other specified symptoms and signs involving the circulatory and respiratory systems: Secondary | ICD-10-CM

## 2013-05-04 DIAGNOSIS — R0609 Other forms of dyspnea: Secondary | ICD-10-CM

## 2013-05-04 DIAGNOSIS — G4730 Sleep apnea, unspecified: Secondary | ICD-10-CM | POA: Insufficient documentation

## 2013-05-04 NOTE — Telephone Encounter (Signed)
Patient is being seen today by Dr Ladona RidgelAylor

## 2013-05-04 NOTE — Assessment & Plan Note (Signed)
This seems likely by history but has not been proven. Will refer for definitive sleep evaluation.

## 2013-05-04 NOTE — Patient Instructions (Signed)
Your physician recommends that you schedule a follow-up appointment in: 3-4 months with Dr Ladona Ridgelaylor  Your physician has recommended that you have a sleep study. This test records several body functions during sleep, including: brain activity, eye movement, oxygen and carbon dioxide blood levels, heart rate and rhythm, breathing rate and rhythm, the flow of air through your mouth and nose, snoring, body muscle movements, and chest and belly movement.

## 2013-05-04 NOTE — Progress Notes (Signed)
HPI [name] returns today for followup. She is a very pleasant 63 year old woman with a history of paroxysmal atrial fibrillation, hypertension, and obesity. She has had worsening episodes of atrial fibrillation, with the episodes this weekend lasting over 40 hours. She was ultimately cardioverted back to NSR. When I question her she notes that she snores loudly nightly. She still feels poorly. She is tired. She denies syncope or peripheral edema. Allergies  Allergen Reactions  . Contrast Media [Iodinated Diagnostic Agents]     Allergy as a child  . Shellfish-Derived Products   . Sulfonamide Derivatives     REACTION: Rash, itching.     Current Outpatient Prescriptions  Medication Sig Dispense Refill  . ALPRAZolam (XANAX) 0.5 MG tablet Take 1 tablet (0.5 mg total) by mouth at bedtime as needed.  30 tablet  0  . dabigatran ([name]) 150 MG CAPS Take 1 capsule (150 mg total) by mouth every 12 (twelve) hours.  60 capsule  6  . diltiazem (CARDIZEM CD) 120 MG 24 hr capsule TAKE 1 CAPSULE BY MOUTH DAILY  90 capsule  3  . flecainide (TAMBOCOR) 100 MG tablet TAKE 1 TABLET BY MOUTH TWICE DAILY  60 tablet  3   No current facility-administered medications for this visit.     Past Medical History  Diagnosis Date  . Hypertension   . Diabetes mellitus   . Dyslipidemia   . Atrial fibrillation     ROS:   All systems reviewed and negative except as noted in the HPI.   Past Surgical History  Procedure Laterality Date  . Appendectomy    . Tonsillectomy and adenoidectomy    . Knee surgery    . Replacement total knee      bilateral, different times  . Breast lumpectomy       Family History  Problem Relation Age of Onset  . Heart attack Mother   . Heart disease Father     cabg x 7  . Heart attack Father   . Stroke Father      History   Social History  . Marital Status: Married    Spouse Name: N/A    Number of Children: N/A  . Years of Education: N/A   Occupational History   . Not on file.   Social History Main Topics  . Smoking status: Never Smoker   . Smokeless tobacco: Never Used  . Alcohol Use: No  . Drug Use: No  . Sexual Activity:    Other Topics Concern  . Not on file   Social History Narrative  . No narrative on file     BP 124/82  Pulse 49  Ht 5\' 8"  (1.727 m)  Wt 236 lb 9.6 oz (107.321 kg)  BMI 35.98 kg/m2  Physical Exam:  Well appearing obese, middle-age woman, NAD HEENT: Unremarkable Neck:  No JVD, no thyromegally Lungs:  Clear with no wheezes, rales, or rhonchi. HEART:  Regular rate rhythm, no murmurs, no rubs, no clicks Abd:  soft, positive bowel sounds, no organomegally, no rebound, no guarding Ext:  2 plus pulses, no edema, no cyanosis, no clubbing Skin:  No rashes no nodules Neuro:  CN II through XII intact, motor grossly intact  EKG Normal sinus rhythm with normal axis and intervals  Assess/Plan:

## 2013-05-04 NOTE — Assessment & Plan Note (Signed)
Her symptoms have worsened despite flecainide. I note that she has had worsening sleep apnea. I will send her for sleep evaluation. She may ultimately need catheter ablation of atrial fibrillation but we will try to treat all possible reversible drivers of her atrial fib first.

## 2013-05-05 ENCOUNTER — Ambulatory Visit: Payer: 59 | Admitting: Physician Assistant

## 2013-05-05 ENCOUNTER — Other Ambulatory Visit: Payer: Self-pay | Admitting: Internal Medicine

## 2013-05-25 ENCOUNTER — Telehealth: Payer: Self-pay | Admitting: Internal Medicine

## 2013-05-25 NOTE — Telephone Encounter (Signed)
New Problem:  Pt just wanted to let Dr. Ladona Ridgelaylor know she canceled her sleep study due to the cost.. Pt states she is feeling fine.

## 2013-05-25 NOTE — Telephone Encounter (Signed)
Will forward to Dr. Taylor.  

## 2013-05-30 ENCOUNTER — Encounter (HOSPITAL_BASED_OUTPATIENT_CLINIC_OR_DEPARTMENT_OTHER): Payer: 59

## 2013-05-31 ENCOUNTER — Telehealth: Payer: Self-pay | Admitting: *Deleted

## 2013-05-31 MED ORDER — DOXYCYCLINE HYCLATE 100 MG PO TABS: 100.0000 mg | ORAL_TABLET | Freq: Two times a day (BID) | ORAL | Status: DC

## 2013-05-31 NOTE — Telephone Encounter (Signed)
I feel more comfortable with doxycycline- eRx sent.

## 2013-05-31 NOTE — Telephone Encounter (Signed)
Pt gave me a note indicating: for 7-10days she has had a runny nose, drainage and head congestion. Also has non-productive cough and since 02/28 has yellow mucus when blowing nose. She states that she feels bad today and is requesting Levaquin to [facility] out pt pharmacy-zpak is not effective. Pt has been taking zyrtec and using nasal saline.  pls advise.

## 2013-05-31 NOTE — Telephone Encounter (Signed)
levaquin could make her prone to QT prolongation. Given her history of arrhythmias, I would want to be sure she has taken this before without issue.

## 2013-05-31 NOTE — Telephone Encounter (Signed)
Spoke to pt and advised. She states that she has taken Levaquin in the past with no adverse reactions.

## 2013-05-31 NOTE — Telephone Encounter (Signed)
Spoke to pt and informed her Rx has been sent to her requested pharmacy

## 2013-06-09 ENCOUNTER — Telehealth: Payer: Self-pay | Admitting: *Deleted

## 2013-06-09 NOTE — Telephone Encounter (Signed)
Patient canceled test due to cost

## 2013-06-09 NOTE — Telephone Encounter (Signed)
Message copied by Deliah BostonLANIER, Kvon Mcilhenny F on Wed Jun 09, 2013  9:46 AM ------      Message from: Sharin MonsRIGLOFF, SHAWNEE I      Created: Tue May 04, 2013  9:56 AM      Regarding: sleep study       Sleep study scheduled for 05-30-13. ------

## 2013-07-13 ENCOUNTER — Telehealth: Payer: Self-pay | Admitting: Internal Medicine

## 2013-07-13 NOTE — Telephone Encounter (Signed)
New message     Pt has been in/out of afib.  Talk about changing flecainide.

## 2013-07-13 NOTE — Telephone Encounter (Signed)
Follow up    Patient calling back to speak with nurse to see if she spoken with Dr. Ladona Ridgelaylor.

## 2013-07-13 NOTE — Telephone Encounter (Signed)
Called patient and she reports that she was here a few months ago and Dr Ladona Ridgelaylor suggested she do a sleep study but her portion of the study was going to cost over $300 so she canceled the test as she could not afford.  She reports an increase in her afib stating she has had 4 episodes in the last 2 weeks that last anywhere from 2-5 hours  She has never had them come this close together.  Husband is ill and she works as a Insurance claims handlertriage nurse at [employer] and just wants to know if she should change her medications.  The last episode was last night around 10pm lasted for 2 hours  She took an extra Flecainide and it stopped

## 2013-07-13 NOTE — Telephone Encounter (Signed)
I have called the patient and let her know Dr Ladona Ridgelaylor left after clinic and I did not get a chance to discuss with him but as this is not an emergent situation and she is back in NSR i will discuss with him on Thurs.  She is very frustrated and really wants a plan as to what to do next  I let her know I was sorry but when in clinic unless emergent I return calls and go over issues with the doctor and the end of the day

## 2013-07-15 MED ORDER — FLECAINIDE ACETATE 100 MG PO TABS: ORAL_TABLET | ORAL | Status: DC

## 2013-07-15 NOTE — Telephone Encounter (Signed)
Discussed with Dr Ladona Ridgelaylor, and he feels she needs to either continue as she is and take the extra Flecainide when needed or increase to 100mg  in the am and 150mg  in the pm.  She wants to try this ans see if it helps.  She is going to really try and be strict about her diet and also says has taken 1/2 a Xanax at night to see if this helps. She will call back if her symptoms continue.  I let hier know the other options are 1: Tikosyn or 2: Ablation.  She will call if problems persist after increasing medication

## 2013-08-12 ENCOUNTER — Other Ambulatory Visit: Payer: Self-pay | Admitting: Internal Medicine

## 2013-09-06 ENCOUNTER — Other Ambulatory Visit: Payer: Self-pay | Admitting: *Deleted

## 2013-09-06 MED ORDER — ALPRAZOLAM 0.5 MG PO TABS: 0.5000 mg | ORAL_TABLET | Freq: Every evening | ORAL | Status: DC | PRN

## 2013-09-06 NOTE — Telephone Encounter (Signed)
Pt requesting medication refill to requested pharmacy. Per Dr Dayton Martes, ok to fill.

## 2013-12-14 ENCOUNTER — Other Ambulatory Visit: Payer: Self-pay | Admitting: Internal Medicine

## 2013-12-17 ENCOUNTER — Telehealth: Payer: Self-pay | Admitting: Internal Medicine

## 2013-12-17 NOTE — Telephone Encounter (Signed)
Spoke with patient and let her know Dr Ladona Ridgel here on Monday and can sign at that time

## 2013-12-17 NOTE — Telephone Encounter (Signed)
New Prob   Pt is calling to follow up on FMLA form. Please call. If pt can not be reached on cell phone, call [facility] at [phone].

## 2013-12-20 ENCOUNTER — Telehealth: Payer: Self-pay | Admitting: Internal Medicine

## 2013-12-20 NOTE — Telephone Encounter (Signed)
New problem ° ° °Pt returning your call from Friday. Please call pt. °

## 2013-12-20 NOTE — Telephone Encounter (Signed)
FMLA papers done and [name] Strickland is faxing today

## 2013-12-20 NOTE — Telephone Encounter (Signed)
FMLA faxed to Wynona Canes Criscione/FMLA Rep With [employer] @ [phone] call back @ [phone] Original Mailed to Pt Home Address  9.21.15/km

## 2013-12-21 ENCOUNTER — Telehealth: Payer: Self-pay | Admitting: Internal Medicine

## 2013-12-21 NOTE — Telephone Encounter (Signed)
Pt Called in Stating [employer] Needs Corrections made on FMLA papers # 6 Needs to Say Follow up in 6 Months & #7 Frequency and Duration  Needs to Be Completed, Took to [place] [employer] For Corrections To be Made if Possible 9.22.15/km

## 2013-12-23 ENCOUNTER — Telehealth: Payer: Self-pay | Admitting: Internal Medicine

## 2013-12-23 NOTE — Telephone Encounter (Signed)
Patient had been instructed in April to increase flecainide to 100 mg q AM and 150 mg q PM. She has been taking 100 BID because she thought the higher dose made her feel a little bad. She will go back to trying the higher dose at bedtime.  Sunday night work up in atrial fibrillation 110s-120s. Converted by Monday am. Wed night atrial fibrillation 90s-110s for couple hours.   Both times she took extra flecainide and 1/2 of alprazalam.  She would like to see Dr. Ladona Ridgel sooner if her episodes of afib continue to be frequent. She has appointment for 10/22.

## 2013-12-23 NOTE — Telephone Encounter (Signed)
New Problem:   On 9/20 pt had Afib and Tac 10.5hr pt took med during this time.  Last night 10 pm gone to bed had another episode for about  2 hr.   Pt would like to be seen sooner.  Pt needs a call back on what to do please call work # choose option #1 they will get pt.

## 2013-12-28 ENCOUNTER — Other Ambulatory Visit: Payer: Self-pay | Admitting: *Deleted

## 2013-12-28 MED ORDER — ALPRAZOLAM 0.5 MG PO TABS: 0.5000 mg | ORAL_TABLET | Freq: Every evening | ORAL | Status: DC | PRN

## 2013-12-28 NOTE — Telephone Encounter (Signed)
Called to [facility]. 

## 2014-01-19 ENCOUNTER — Ambulatory Visit (INDEPENDENT_AMBULATORY_CARE_PROVIDER_SITE_OTHER): Payer: 59 | Admitting: Internal Medicine

## 2014-01-19 ENCOUNTER — Other Ambulatory Visit: Payer: Self-pay | Admitting: Internal Medicine

## 2014-01-19 ENCOUNTER — Encounter: Payer: Self-pay | Admitting: Internal Medicine

## 2014-01-19 VITALS — BP 132/76 | HR 67 | Ht 68.0 in | Wt 265.6 lb

## 2014-01-19 DIAGNOSIS — I480 Paroxysmal atrial fibrillation: Secondary | ICD-10-CM

## 2014-01-19 DIAGNOSIS — R030 Elevated blood-pressure reading, without diagnosis of hypertension: Secondary | ICD-10-CM

## 2014-01-19 NOTE — Assessment & Plan Note (Signed)
She is encouraged to lose weight. Her blood pressure is reasonably well controlled today. Will follow.

## 2014-01-19 NOTE — Progress Notes (Signed)
HPI [name] returns today for followup. She is a very pleasant 63 year old woman with a history of paroxysmal atrial fibrillation, hypertension, and obesity. She has had better control of her atrial fib and is now on 100 mg in the a.m. And, 150 mg in the evening.  When I question her she notes that she snores nightly. She is tired as she is often up at night caring for her husband who is ill. She denies syncope or peripheral edema. Allergies  Allergen Reactions  . Shellfish-Derived Products     Swelling   . Contrast Media [Iodinated Diagnostic Agents]     Allergy as a child  . Sulfonamide Derivatives     REACTION: Rash, itching.     Current Outpatient Prescriptions  Medication Sig Dispense Refill  . ALPRAZolam (XANAX) 0.5 MG tablet Take 1 tablet (0.5 mg total) by mouth at bedtime as needed.  30 tablet  0  . diltiazem (CARDIZEM CD) 120 MG 24 hr capsule TAKE 1 CAPSULE BY MOUTH DAILY  90 capsule  1  . flecainide (TAMBOCOR) 100 MG tablet TAKE 1 TABLET BY MOUTH IN THE MORNING AND 1 & 1/2 TABLETS BY MOUTH IN THE EVENING  105 tablet  0  . [name] 150 MG CAPS capsule TAKE 1 CAPSULE BY MOUTH EVERY 12 HOURS  60 capsule  0   No current facility-administered medications for this visit.     Past Medical History  Diagnosis Date  . Hypertension   . Diabetes mellitus   . Dyslipidemia   . Atrial fibrillation     ROS:   All systems reviewed and negative except as noted in the HPI.   Past Surgical History  Procedure Laterality Date  . Appendectomy    . Tonsillectomy and adenoidectomy    . Knee surgery    . Replacement total knee      bilateral, different times  . Breast lumpectomy       Family History  Problem Relation Age of Onset  . Heart attack Mother   . Heart disease Father     cabg x 7  . Heart attack Father   . Stroke Father      History   Social History  . Marital Status: Married    Spouse Name: N/A    Number of Children: N/A  . Years of Education: N/A    Occupational History  . Not on file.   Social History Main Topics  . Smoking status: Never Smoker   . Smokeless tobacco: Never Used  . Alcohol Use: No  . Drug Use: No  . Sexual Activity:    Other Topics Concern  . Not on file   Social History Narrative  . No narrative on file     BP 132/76  Pulse 67  Ht 5\' 8"  (1.727 m)  Wt 265 lb 9.6 oz (120.475 kg)  BMI 40.39 kg/m2  Physical Exam:  Well appearing obese, middle-age woman, NAD HEENT: Unremarkable Neck:  No JVD, no thyromegally Lungs:  Clear with no wheezes, rales, or rhonchi. HEART:  Regular rate rhythm, no murmurs, no rubs, no clicks Abd:  soft, positive bowel sounds, no organomegally, no rebound, no guarding Ext:  2 plus pulses, no edema, no cyanosis, no clubbing Skin:  No rashes no nodules Neuro:  CN II through XII intact, motor grossly intact  EKG Normal sinus rhythm with normal axis and intervals  Assess/Plan:

## 2014-01-19 NOTE — Patient Instructions (Signed)
Your physician recommends that you continue on your current medications as directed. Please refer to the Current Medication list given to you today.  Your physician wants you to follow-up in: 1 year with Dr. Taylor.  You will receive a reminder letter in the mail two months in advance. If you don't receive a letter, please call our office to schedule the follow-up appointment.  

## 2014-01-19 NOTE — Assessment & Plan Note (Signed)
She is having approx. 2 breakthrough episodes of atrial fib monthly. She will continue her current medical regimen but may increase to 150 mg twice daily if needed. She will notify us if this is required.

## 2014-01-30 ENCOUNTER — Other Ambulatory Visit: Payer: Self-pay

## 2014-01-30 MED ORDER — FLECAINIDE ACETATE 100 MG PO TABS: ORAL_TABLET | ORAL | Status: DC

## 2014-02-18 ENCOUNTER — Other Ambulatory Visit: Payer: Self-pay | Admitting: Internal Medicine

## 2014-03-29 ENCOUNTER — Telehealth: Payer: Self-pay | Admitting: *Deleted

## 2014-03-29 NOTE — Telephone Encounter (Signed)
Erroneous encounter

## 2014-07-02 ENCOUNTER — Emergency Department (HOSPITAL_COMMUNITY)
Admit: 2014-07-02 | Disposition: A | Discharge status: Other Acute Inpatient Hospital | Payer: 59 | Admitting: Emergency Medicine

## 2014-07-02 ENCOUNTER — Inpatient Hospital Stay (HOSPITAL_COMMUNITY)
Admission: AD | Admit: 2014-07-02 | Discharge: 2014-07-03 | DRG: 310 | Disposition: A | Discharge status: Home / Self Care | Payer: 59 | Source: Other Acute Inpatient Hospital | Attending: Cardiovascular Disease | Admitting: Cardiovascular Disease

## 2014-07-02 DIAGNOSIS — Z6838 Body mass index (BMI) 38.0-38.9, adult: Secondary | ICD-10-CM

## 2014-07-02 DIAGNOSIS — E119 Type 2 diabetes mellitus without complications: Secondary | ICD-10-CM | POA: Diagnosis present

## 2014-07-02 DIAGNOSIS — Z22322 Carrier or suspected carrier of Methicillin resistant Staphylococcus aureus: Secondary | ICD-10-CM | POA: Diagnosis not present

## 2014-07-02 DIAGNOSIS — I10 Essential (primary) hypertension: Secondary | ICD-10-CM | POA: Diagnosis present

## 2014-07-02 DIAGNOSIS — Z79899 Other long term (current) drug therapy: Secondary | ICD-10-CM | POA: Diagnosis not present

## 2014-07-02 DIAGNOSIS — I4892 Unspecified atrial flutter: Secondary | ICD-10-CM | POA: Diagnosis present

## 2014-07-02 DIAGNOSIS — G4730 Sleep apnea, unspecified: Secondary | ICD-10-CM | POA: Diagnosis present

## 2014-07-02 DIAGNOSIS — I480 Paroxysmal atrial fibrillation: Secondary | ICD-10-CM

## 2014-07-02 DIAGNOSIS — I4891 Unspecified atrial fibrillation: Secondary | ICD-10-CM | POA: Diagnosis present

## 2014-07-02 LAB — BASIC METABOLIC PANEL
Anion Gap: 6 — ABNORMAL LOW (ref 7–16)
Anion gap: 11 (ref 5–15)
BUN: 12 mg/dL
BUN: 6 mg/dL (ref 6–23)
CALCIUM: 8.8 mg/dL (ref 8.4–10.5)
CHLORIDE: 105 mmol/L
CHLORIDE: 107 mmol/L (ref 96–112)
CO2: 22 mmol/L (ref 19–32)
CREATININE: 0.86 mg/dL
Calcium, Total: 8.9 mg/dL
Co2: 26 mmol/L
Creatinine, Ser: 0.65 mg/dL (ref 0.50–1.10)
GFR calc Af Amer: >90 mL/min (ref >90)
GFR calc non Af Amer: >90 mL/min (ref >90)
GLUCOSE: 175 mg/dL — AB
GLUCOSE: 121 mg/dL — AB (ref 70–99)
Potassium: 3.8 mmol/L
Potassium: 4.2 mmol/L (ref 3.5–5.1)
SODIUM: 140 mmol/L (ref 135–145)
Sodium: 137 mmol/L

## 2014-07-02 LAB — URINALYSIS, COMPLETE
BACTERIA: NONE SEEN
BILIRUBIN, UR: NEGATIVE
Blood: NEGATIVE
GLUCOSE, UR: NEGATIVE mg/dL (ref 0–75)
KETONE: NEGATIVE
Leukocyte Esterase: NEGATIVE
NITRITE: NEGATIVE
Ph: 5 (ref 4.5–8.0)
Protein: NEGATIVE
RBC,UR: NONE SEEN /HPF (ref 0–5)
SPECIFIC GRAVITY: 1.005 (ref 1.003–1.030)
Squamous Epithelial: <1

## 2014-07-02 LAB — PRO B NATRIURETIC PEPTIDE: B-TYPE NATIURETIC PEPTID: 316 pg/mL — AB

## 2014-07-02 LAB — TROPONIN I: Troponin-I: <0.03 ng/mL

## 2014-07-02 LAB — CBC WITH DIFFERENTIAL/PLATELET
Basophils Absolute: 0 10*3/uL (ref 0.0–0.1)
Basophils Relative: 0 % (ref 0–1)
EOS ABS: 0.2 10*3/uL (ref 0.0–0.7)
EOS PCT: 2 % (ref 0–5)
HCT: 46.7 % — ABNORMAL HIGH (ref 36.0–46.0)
Hemoglobin: 15.7 g/dL — ABNORMAL HIGH (ref 12.0–15.0)
Lymphocytes Relative: 31 % (ref 12–46)
Lymphs Abs: 3 10*3/uL (ref 0.7–4.0)
MCH: 27.8 pg (ref 26.0–34.0)
MCHC: 33.6 g/dL (ref 30.0–36.0)
MCV: 82.8 fL (ref 78.0–100.0)
Monocytes Absolute: 0.6 10*3/uL (ref 0.1–1.0)
Monocytes Relative: 6 % (ref 3–12)
NEUTROS ABS: 5.8 10*3/uL (ref 1.7–7.7)
Neutrophils Relative %: 61 % (ref 43–77)
PLATELETS: 294 10*3/uL (ref 150–400)
RBC: 5.64 MIL/uL — AB (ref 3.87–5.11)
RDW: 13.9 % (ref 11.5–15.5)
WBC: 9.6 10*3/uL (ref 4.0–10.5)

## 2014-07-02 LAB — TSH: TSH: 3.262 u[IU]/mL (ref 0.350–4.500)

## 2014-07-02 LAB — CBC
HCT: 49.9 % — AB (ref 35.0–47.0)
HGB: 16.6 g/dL — ABNORMAL HIGH (ref 12.0–16.0)
MCH: 27.7 pg (ref 26.0–34.0)
MCHC: 33.3 g/dL (ref 32.0–36.0)
MCV: 83 fL (ref 80–100)
PLATELETS: 363 10*3/uL (ref 150–440)
RBC: 6 10*6/uL — AB (ref 3.80–5.20)
RDW: 14.5 % (ref 11.5–14.5)
WBC: 11.9 10*3/uL — AB (ref 3.6–11.0)

## 2014-07-02 LAB — MAGNESIUM: MAGNESIUM: 2 mg/dL

## 2014-07-02 MED ORDER — DABIGATRAN ETEXILATE MESYLATE 150 MG PO CAPS
150.0000 mg | ORAL_CAPSULE | Freq: Two times a day (BID) | ORAL | Status: DC
  Administered 2014-07-02 – 2014-07-03 (×2): 150 mg via ORAL
  Filled 2014-07-02 (×3): qty 1

## 2014-07-02 MED ORDER — ONDANSETRON HCL 4 MG/2ML IJ SOLN: 4.0000 mg | Freq: Four times a day (QID) | INTRAMUSCULAR | Status: DC | PRN

## 2014-07-02 MED ORDER — ALPRAZOLAM 0.5 MG PO TABS
0.5000 mg | ORAL_TABLET | Freq: Every evening | ORAL | Status: DC | PRN
  Administered 2014-07-03: 0.5 mg via ORAL
  Filled 2014-07-02 (×2): qty 1

## 2014-07-02 MED ORDER — ACETAMINOPHEN 325 MG PO TABS: 650.0000 mg | ORAL_TABLET | ORAL | Status: DC | PRN

## 2014-07-02 MED ORDER — DILTIAZEM HCL 100 MG IV SOLR
5.0000 mg/h | INTRAVENOUS | Status: DC
  Administered 2014-07-02: 10 mg/h via INTRAVENOUS
  Administered 2014-07-03: 7 mg/h via INTRAVENOUS
  Filled 2014-07-02 (×2): qty 100

## 2014-07-02 NOTE — Progress Notes (Signed)
The patient was fully assessed by Dr. Elease HashimotoNahser. He prepared the history and physical. I have seen the patient here at [facility]. She is stable. Orders have been written.  Jerral BonitoJeff Rollo Farquhar, MD

## 2014-07-02 NOTE — H&P (Signed)
ADMISSION HISTORY AND PHYSICAL   Date: 07/02/2014               Patient Name:  [name] Strickland MRN: [id number]  DOB: 1950/08/17 Age / Sex: 64 y.o., female        PCP: Ruthe Mannan Primary Cardiologist: Ladona Ridgel          History of Present Illness: Patient is a 64 y.o. female with a PMHx of Afib / Flutter , who was admitted to [facility] on 07/02/2014 for evaluation of  Rapid atrial fib .  The patient has PAF.  Has episodes several times a month.  Typically last only 12 hours and then she converts.  Work up with Afib yesterday.  Took an extra Flecainide 100 mg.  Did not convert Took another dose at noon.  Took her evening dose .  She is on Tiazac 120 a day. Later that night she had marked tachycardia.  Her pulse ox recorded HR of 250.  She was very light headed, sweaty, called EMS. Her in the [facility] ER, ECG showed A-flutter with 1:1 conduction. Has slowed with Cardizem drip   No CP, comfortable at this point   We have no ICU beds here at [facility] for the dilt drip    Medications: Outpatient medications:  (Not in a hospital admission)  Allergies  Allergen Reactions  . Shellfish-Derived Products     Swelling   . Contrast Media [Iodinated Diagnostic Agents]     Allergy as a child  . Sulfonamide Derivatives     REACTION: Rash, itching.     Past Medical History  Diagnosis Date  . Hypertension   . Diabetes mellitus   . Dyslipidemia   . Atrial fibrillation     Past Surgical History  Procedure Laterality Date  . Appendectomy    . Tonsillectomy and adenoidectomy    . Knee surgery    . Replacement total knee      bilateral, different times  . Breast lumpectomy      Family History  Problem Relation Age of Onset  . Heart attack Mother   . Heart disease Father     cabg x 7  . Heart attack Father   . Stroke Father     Social History:  reports that she has never smoked. She has never used smokeless tobacco. She reports that she does not drink alcohol or use illicit  drugs.   Review of Systems: Constitutional:  denies fever, chills, diaphoresis, appetite change and fatigue.  HEENT: denies photophobia, eye pain, redness, hearing loss, ear pain, congestion, sore throat, rhinorrhea, sneezing, neck pain, neck stiffness and tinnitus.  Respiratory: denies SOB, DOE, cough, chest tightness, and wheezing.  Cardiovascular: denies chest pain, palpitations and leg swelling.  Gastrointestinal: denies nausea, vomiting, abdominal pain, diarrhea, constipation, blood in stool.  Genitourinary: denies dysuria, urgency, frequency, hematuria, flank pain and difficulty urinating.  Musculoskeletal: denies  myalgias, back pain, joint swelling, arthralgias and gait problem.   Skin: denies pallor, rash and wound.  Neurological: denies dizziness, seizures, syncope, weakness, light-headedness, numbness and headaches.   Hematological: denies adenopathy, easy bruising, personal or family bleeding history.  Psychiatric/ Behavioral: denies suicidal ideation, mood changes, confusion, nervousness, sleep disturbance and agitation.    Physical Exam: There were no vitals taken for this visit.  Wt Readings from Last 3 Encounters:  01/19/14 265 lb 9.6 oz (120.475 kg)  05/04/13 236 lb 9.6 oz (107.321 kg)  02/23/13 260 lb (117.935 kg)  General: Vital signs reviewed and noted. Well-developed, well-nourished, in no acute distress; alert,  Anxious   Head: Normocephalic, atraumatic, sclera anicteric, mucus membranes are moist   Neck: Supple. Negative for carotid bruits. JVD not elevated.   Lungs:  Clear bilaterally to auscultation without wheezes, rales, or rhonchi. Breathing is normal   Heart: RRR with S1 S2. No murmurs, rubs, or gallops. She is tachycardic   Abdomen:  Soft, non-tender, non-distended with normoactive bowel sounds. No hepatomegaly. No rebound/guarding. No obvious abdominal masses   MSK: Strength and the appear normal for age.   Extremities: No clubbing or cyanosis. No  edema.  Distal pedal pulses are 2+ and equal bilaterally .  Neurologic: Alert and oriented X 3. Moves all extremities spontaneously   Psych:  normal     Lab results: Basic Metabolic Panel: No results for input(s): NA, K, CL, CO2, GLUCOSE, BUN, CREATININE, CALCIUM, MG, PHOS in the last 168 hours.  Liver Function Tests: No results for input(s): AST, ALT, ALKPHOS, BILITOT, PROT, ALBUMIN in the last 168 hours. No results for input(s): LIPASE, AMYLASE in the last 168 hours.  CBC: No results for input(s): WBC, NEUTROABS, HGB, HCT, MCV, PLT in the last 168 hours.  Cardiac Enzymes: No results for input(s): CKTOTAL, CKMB, CKMBINDEX, TROPONINI in the last 168 hours.  BNP: Invalid input(s): POCBNP  CBG: No results for input(s): GLUCAP in the last 168 hours.  Coagulation Studies: No results for input(s): LABPROT, INR in the last 72 hours.   Other results: EKG Atrial fib /  flutter with HR of 110 - on Dilt drip   Imaging:  No results found.     Assessment & Plan:  1. Atrial fib / flutter:  The patient has atrial fib / flutter.  She took lots of flecainide last night and slowed her flutter rate to the point that she conducted  1:1 .  She now is slower adn is well controlled. She feels better on the Dilt drip. Unfortunately, we do [facility] have [facility] at [facility] so [facility]ilt. She did not want to have cardioversion and return home from there ER here.  Will give her Metoprolol 50 mg here.  Continue IV Dilt, titrate / taper as needed.   Ultimately, she will need to be started on either amiodarone or Tikosyn.  It does not appear that the Flecainide is working well enough.     Continue pradaxa   2. Essential hypertension:  Continue meds.   Will metoprolol 50  mg bid   ( or 25 BID if her HR is slow)   to her diltiazem  DVT PPX - [name]    Vesta MixerPhilip J. Nahser, Montez HagemanJr., MD, [facility] 07/02/2014, 10:58 AM

## 2014-07-02 NOTE — H&P (Deleted)
  The note originally documented on this encounter has been moved the the encounter in which it belongs.  

## 2014-07-03 ENCOUNTER — Inpatient Hospital Stay (HOSPITAL_COMMUNITY): Payer: 59 | Admitting: Anesthesiology

## 2014-07-03 ENCOUNTER — Encounter (HOSPITAL_COMMUNITY): Payer: Self-pay | Admitting: Anesthesiology

## 2014-07-03 ENCOUNTER — Encounter (HOSPITAL_COMMUNITY)
Admission: AD | Disposition: A | Discharge status: Home / Self Care | Payer: Self-pay | Source: Other Acute Inpatient Hospital | Attending: Cardiovascular Disease

## 2014-07-03 DIAGNOSIS — I480 Paroxysmal atrial fibrillation: Principal | ICD-10-CM

## 2014-07-03 HISTORY — PX: CARDIOVERSION: SHX1299

## 2014-07-03 LAB — BASIC METABOLIC PANEL
Anion gap: 8 (ref 5–15)
BUN: 7 mg/dL (ref 6–23)
CALCIUM: 8.9 mg/dL (ref 8.4–10.5)
CHLORIDE: 106 mmol/L (ref 96–112)
CO2: 22 mmol/L (ref 19–32)
CREATININE: 0.64 mg/dL (ref 0.50–1.10)
GFR calc Af Amer: >90 mL/min (ref >90)
GFR calc non Af Amer: >90 mL/min (ref >90)
Glucose, Bld: 128 mg/dL — ABNORMAL HIGH (ref 70–99)
Potassium: 4.6 mmol/L (ref 3.5–5.1)
SODIUM: 136 mmol/L (ref 135–145)

## 2014-07-03 LAB — GLUCOSE, CAPILLARY: Glucose-Capillary: 110 mg/dL — ABNORMAL HIGH (ref 70–99)

## 2014-07-03 LAB — TROPONIN I: Troponin I: <0.03 ng/mL (ref <0.031)

## 2014-07-03 LAB — MRSA PCR SCREENING: MRSA by PCR: POSITIVE — AB

## 2014-07-03 SURGERY — CARDIOVERSION
Anesthesia: General

## 2014-07-03 MED ORDER — FLECAINIDE ACETATE 100 MG PO TABS
100.0000 mg | ORAL_TABLET | Freq: Once | ORAL | Status: AC
  Administered 2014-07-03: 100 mg via ORAL
  Filled 2014-07-03 (×2): qty 1

## 2014-07-03 MED ORDER — MUPIROCIN 2 % EX OINT
1.0000 "application " | TOPICAL_OINTMENT | Freq: Two times a day (BID) | CUTANEOUS | Status: DC
  Administered 2014-07-03: 1 via NASAL
  Filled 2014-07-03: qty 22

## 2014-07-03 MED ORDER — LIDOCAINE IN D5W 4-5 MG/ML-% IV SOLN
4.0000 mg/min | INTRAVENOUS | Status: DC
  Filled 2014-07-03: qty 500

## 2014-07-03 MED ORDER — CHLORHEXIDINE GLUCONATE CLOTH 2 % EX PADS: 6.0000 | MEDICATED_PAD | Freq: Every day | CUTANEOUS | Status: DC

## 2014-07-03 MED ORDER — DILTIAZEM HCL 60 MG PO TABS
60.0000 mg | ORAL_TABLET | Freq: Once | ORAL | Status: AC
  Administered 2014-07-03: 60 mg via ORAL
  Filled 2014-07-03: qty 1

## 2014-07-03 MED ORDER — PROPOFOL 10 MG/ML IV BOLUS
INTRAVENOUS | Status: DC | PRN
  Administered 2014-07-03: 80 mg via INTRAVENOUS

## 2014-07-03 MED ORDER — MUPIROCIN 2 % EX OINT: 1.0000 "application " | TOPICAL_OINTMENT | Freq: Two times a day (BID) | CUTANEOUS | Status: DC

## 2014-07-03 MED ORDER — FLECAINIDE ACETATE 50 MG PO TABS: 50.0000 mg | ORAL_TABLET | Freq: Once | ORAL | Status: DC

## 2014-07-03 NOTE — CV Procedure (Signed)
Careful planning was done to proceed with cardioversion. The patient had been nothing by mouth. She has been on her anticoagulant very reliably. Time out was taken. Permit was signed appropriately.  Anterior posterior pads were placed. Biphasic defibrillator was used.  Anesthesia was present. The patient received 80 mg of IV propofol.  The patient received one shock with 75 J of biphasic energy. She converted immediately to a brief period of junctional bradycardia followed by sinus bradycardia. She is waking up appropriately and doing well.  Successful cardioversion with one shock.   Jerral BonitoJeff Jatziry Wechter, MD

## 2014-07-03 NOTE — Progress Notes (Signed)
Patient has been successfully cardioverted to sinus rhythm. We will watch her for at least one hour. If she is completely stable she'll be discharged home. We will help arrange for very early follow-up with Dr. Ladona Ridgelaylor to make decisions about her medications going forward.  Jerral BonitoJeff Mandy Fitzwater, MD

## 2014-07-03 NOTE — Anesthesia Postprocedure Evaluation (Signed)
  Anesthesia Post-op Note  Patient: [name] Strickland  Procedure(s) Performed: Procedure(s): CARDIOVERSION (N/A)  Patient Location: ICU  Anesthesia Type:General  Level of Consciousness: awake and alert   Airway and Oxygen Therapy: Patient Spontanous Breathing and Patient connected to nasal cannula oxygen  Post-op Pain: none  Post-op Assessment: Post-op Vital signs reviewed, Patient's Cardiovascular Status Stable and Respiratory Function Stable  Post-op Vital Signs: Reviewed and stable  Last Vitals:  Filed Vitals:   07/03/14 1300  BP: 130/113  Pulse: 44  Temp:   Resp:     Complications: No apparent anesthesia complications

## 2014-07-03 NOTE — Progress Notes (Signed)
Pt discharged. Discharge, medication, emergency, and when to call MD instructions given. VS stable at time of discharge. Family arrived. No questions or complaints at this time.  Demetrius Barrell L

## 2014-07-03 NOTE — Discharge Summary (Signed)
Discharge Summary   Patient ID: [name] GlassmanRena Maness Leiter,  MRN: [id number], DOB/AGE: 62/05/1950 62 y.o.  Admit date: 07/02/2014 Discharge date: 07/03/2014  Primary Care Provider: Ruthe Mannanalia [name] Primary Cardiologist: Dr. Ladona Ridgelaylor  Discharge Diagnoses Principal Problem:   Atrial fibrillation Active Problems:   Sleep apnea   Allergies Allergies  Allergen Reactions  . Shellfish-Derived Products Itching and Swelling       . Iodine Other (See Comments)    Childhood reaction to topical iodine  . Contrast Media [Iodinated Diagnostic Agents] Other (See Comments)    Reaction to topical iodine as a child  . Sulfonamide Derivatives Itching and Rash    Procedures  DCCV 07/03/2014  Careful planning was done to proceed with cardioversion. The patient had been nothing by mouth. She has been on her anticoagulant very reliably. Time out was taken. Permit was signed appropriately.  Anterior posterior pads were placed. Biphasic defibrillator was used.  Anesthesia was present. The patient received 80 mg of IV propofol.  The patient received one shock with 75 J of biphasic energy. She converted immediately to a brief period of junctional bradycardia followed by sinus bradycardia. She is waking up appropriately and doing well.  Successful cardioversion with one shock.    Hospital Course  The patient is a 64 year old female with past medical history of atrial fibrillation/atrial flutter who presented to [facility] for evaluation of rapid A. fib. Patient has history of paroxysmal atrial fibrillation and has had several episodes this month. Her atrial fibrillation typically last only 12 hours and then she would convert to NSR. Prior to her arrival at [facility], she took extra flecainide 100 mg and did not convert. She also noted significant tachycardia. Her pulse ox recorded heart rate of 250. She was very lightheaded and sweaty and called EMS. Her heart rate has slowed on Cardizem drip. Patient  was transferred to [facility] as there was no ICU bed in [facility] and patient did not wish for cardioversion and discharge to home from ED while at [facility]. She was given 50 mg metoprolol prior to transfer.   She was seen by Dr. Myrtis SerKatz on 07/03/2014, after discussing with patient, she agreed to DC cardioversion. She underwent DC cardioversion in the afternoon of 4/3, she received a single shock of 75 J of biphasic energy before converting to brief episode of junctional bradycardia followed by sinus bradycardia. She was kept in the hospital for an additional hour for observation. She is deemed stable for discharge from cardiology perspective. As for her flecainide, she did not immediately cardiovert after taking an extra dose of flecainide at home. I have discussed with Dr. Myrtis SerKatz, he will be discharged home on previous home medication, however Dr. Myrtis SerKatz will talk to Dr. Ladona Ridgelaylor tomorrow and try to have the patient follow-up with Dr. Ladona Ridgelaylor within 3 days after discharge. Patient may need other antiarrhythmic medication to help control the frequent and onset of PAF.  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 rate 16, height 5\' 8"  (1.727 m), weight 256 lb 6.3 oz (116.3 kg), SpO2 97 %.  Filed Weights   07/02/14 1400  Weight: 256 lb 6.3 oz (116.3 kg)    Labs  CBC  Recent Labs  07/02/14 1559  WBC 9.6  NEUTROABS 5.8  HGB 15.7*  HCT 46.7*  MCV 82.8  PLT  294   Basic Metabolic Panel  Recent Labs  07/02/14 1559 07/03/14 0407  NA 140 136  K 4.2 4.6  CL 107 106  CO2 22 22  GLUCOSE 121* 128*  BUN 6 7  CREATININE 0.65 0.64  CALCIUM 8.8 8.9   Cardiac Enzymes  Recent Labs  07/02/14 1559 07/02/14 2140 07/03/14 1100  TROPONINI <0.03 <0.03 <0.03   Thyroid Function Tests  Recent Labs  07/02/14 1559    TSH 3.262    Disposition  Pt is being discharged home today in good condition.  Follow-up Plans & Appointments      Follow-up Information    Follow up with Lewayne Bunting, MD.   Specialty:  Cardiology   Why:  Dr. Myrtis Ser will talk with Dr. Lubertha Basque nurse to try to make a very close followup within 3 days, Please give [place] a call if you do not hear from [place] by Monday afternoon   Contact information:   [address] [place] [address] [phone]       Discharge Medications    Medication List    TAKE these medications        acetaminophen 650 MG CR tablet  Commonly known as:  TYLENOL  Take 650 mg by mouth at bedtime as needed for pain.     ALPRAZolam 0.5 MG tablet  Commonly known as:  XANAX  Take 1 tablet (0.5 mg total) by mouth at bedtime as needed.     CARTIA XT 120 MG 24 hr capsule  Generic drug:  diltiazem  TAKE 1 CAPSULE BY MOUTH DAILY     flecainide 100 MG tablet  Commonly known as:  TAMBOCOR  TAKE 1 TABLET BY MOUTH IN THE MORNING AND 1 & 1/2 TABLETS BY MOUTH IN THE EVENING     mupirocin ointment 2 %  Commonly known as:  BACTROBAN  Place 1 application into the nose 2 (two) times daily.     [name] 150 MG Caps capsule  Generic drug:  dabigatran  TAKE 1 CAPSULE BY MOUTH EVERY 12 HOURS        Duration of Discharge Encounter   Greater than 30 minutes including physician time.  Ramond Dial PA-C Pager: [phone] 07/03/2014, 4:18 PM Patient seen and examined. I agree with the assessment and plan as detailed above. See also my additional thoughts below.   I made the decision for the patient to be discharged. I agree with the node and plans as outlined above.  Willa Rough, MD, [facility] 07/04/2014 7:56 AM

## 2014-07-03 NOTE — Progress Notes (Signed)
UR Completed.  [phone]  

## 2014-07-03 NOTE — Progress Notes (Signed)
Pt refused.

## 2014-07-03 NOTE — Progress Notes (Addendum)
SUBJECTIVE: Patient's atrial flutter continues. She is on a 15 mg drip of diltiazem. The rate ranges from 75-90.  Filed Vitals:   07/03/14 1000 07/03/14 1100 07/03/14 1200 07/03/14 1224  BP: 104/60 106/85 121/88   Pulse: 129 91 40   Temp:    98.4 F (36.9 C)  TempSrc:    Oral  Resp:      Height:      Weight:      SpO2: 95% 95% 94%      Intake/Output Summary (Last 24 hours) at 07/03/14 1306 Last data filed at 07/03/14 1200  Gross per 24 hour  Intake   1040 ml  Output   2850 ml  Net  -1810 ml    LABS: Basic Metabolic Panel:  Recent Labs  62/95/28 1559 07/03/14 0407  NA 140 136  K 4.2 4.6  CL 107 106  CO2 22 22  GLUCOSE 121* 128*  BUN 6 7  CREATININE 0.65 0.64  CALCIUM 8.8 8.9   Liver Function Tests: No results for input(s): AST, ALT, ALKPHOS, BILITOT, PROT, ALBUMIN in the last 72 hours. No results for input(s): LIPASE, AMYLASE in the last 72 hours. CBC:  Recent Labs  07/02/14 1559  WBC 9.6  NEUTROABS 5.8  HGB 15.7*  HCT 46.7*  MCV 82.8  PLT 294   Cardiac Enzymes:  Recent Labs  07/02/14 1559 07/02/14 2140 07/03/14 1100  TROPONINI <0.03 <0.03 <0.03   BNP: Invalid input(s): POCBNP D-Dimer: No results for input(s): DDIMER in the last 72 hours. Hemoglobin A1C: No results for input(s): HGBA1C in the last 72 hours. Fasting Lipid Panel: No results for input(s): CHOL, HDL, LDLCALC, TRIG, CHOLHDL, LDLDIRECT in the last 72 hours. Thyroid Function Tests:  Recent Labs  07/02/14 1559  TSH 3.262    RADIOLOGY: No results found.  PHYSICAL EXAM patient is oriented to person time and place. Affect is normal. Head is atraumatic. Sclerae and conjunctiva are normal. There is no jugular venous distention. She has poor dentition. Lungs reveal scattered rhonchi. Cardiac exam feels S1 and S2. The abdomen is soft. There is no peripheral edema. There are no musculoskeletal deformities. There are no skin rashes.   TELEMETRY:  I have reviewed telemetry today  July 03, 2014. There is atrial flutter.  ASSESSMENT AND PLAN  Atrial flutter   Atrial flutter continues today. I'm looking into the options as to whether or not we should send her home with rate control or proceed with cardioversion today. It is of note that the patient had a very rapid rate at one time yesterday. We think this may have been due to the large amount of extra flecainide that she took with only a small amount of AV nodal blocking protection. She's been monitored and stable since yesterday. Therefore I feel it is safe to proceed with cardioversion leaving her on her same meds. She has been very carefully anticoagulated. If we are able to get her home after cardioversion later today, she needs very early follow-up with Dr. Ladona Ridgel to review her medications. It will be optimal for Dr. Ladona Ridgel to discuss the case with Dr. Elease Hashimoto, as he saw the rapid rhythms at [facility].  I have been able to arrange for cardioversion to be done with anesthesia this afternoon in her room, 2S09.  I'm hopeful that we'll be able to proceed sometime between 2:30 and 4:30. The timing will be obtained as these etiology. The patient can then be watched for a brief period of time  and discharged home assuming she is stable.  I feel that this is the best approach for this patient as of today.   Willa RoughJeffrey Katz 07/03/2014 1:06 PM

## 2014-07-03 NOTE — Progress Notes (Signed)
Successful cardioversion. SB 50s.

## 2014-07-03 NOTE — Transfer of Care (Signed)
Immediate Anesthesia Transfer of Care Note  Patient: [name] Strickland  Procedure(s) Performed: Procedure(s): CARDIOVERSION (N/A)  Patient Location: ICU  Anesthesia Type:General  Level of Consciousness: awake and alert   Airway & Oxygen Therapy: Patient Spontanous Breathing and Patient connected to nasal cannula oxygen  Post-op Assessment: Report given to RN and Post -op Vital signs reviewed and stable  Post vital signs: Reviewed and stable  Last Vitals:  Filed Vitals:   07/03/14 1300  BP: 130/113  Pulse: 44  Temp:   Resp:     Complications: No apparent anesthesia complications

## 2014-07-03 NOTE — Anesthesia Preprocedure Evaluation (Addendum)
Anesthesia Evaluation  Patient identified by MRN, date of birth, ID band Patient awake    Reviewed: Allergy & Precautions, NPO status , Patient's Chart, lab work & pertinent test results  Airway Mallampati: II  TM Distance: >3 FB Neck ROM: Full    Dental no notable dental hx. (+) Teeth Intact, Dental Advisory Given   Pulmonary sleep apnea ,  breath sounds clear to auscultation  Pulmonary exam normal       Cardiovascular hypertension, Pt. on medications Rhythm:Regular Rate:Normal     Neuro/Psych negative neurological ROS  negative psych ROS   GI/Hepatic negative GI ROS, Neg liver ROS,   Endo/Other  diabetes, Type obesity  Renal/GU negative Renal ROS     Musculoskeletal negative musculoskeletal ROS (+)   Abdominal   Peds  Hematology negative hematology ROS (+)   Anesthesia Other Findings   Reproductive/Obstetrics negative OB ROS                            Anesthesia Physical Anesthesia Plan  ASA: III  Anesthesia Plan: General   Post-op Pain Management:    Induction: Intravenous  Airway Management Planned: Simple Face Mask  Additional Equipment: None  Intra-op Plan:   Post-operative Plan: Extubation in OR  Informed Consent: I have reviewed the patients History and Physical, chart, labs and discussed the procedure including the risks, benefits and alternatives for the proposed anesthesia with the patient or authorized representative who has indicated his/her understanding and acceptance.   Dental advisory given  Plan Discussed with: CRNA  Anesthesia Plan Comments:         Anesthesia Quick Evaluation

## 2014-07-04 ENCOUNTER — Telehealth: Payer: Self-pay | Admitting: Internal Medicine

## 2014-07-04 ENCOUNTER — Encounter (HOSPITAL_COMMUNITY): Payer: Self-pay | Admitting: Cardiology

## 2014-07-04 NOTE — Telephone Encounter (Signed)
Spoke with the patient and every time she walks across the floor she goes fast HR around 90.  HR in the hospital was in the 60's.  This morning her HR was in the 90's, then lowered in the 80's.  She is good now and has an appointment tomorrow.

## 2014-07-04 NOTE — Telephone Encounter (Signed)
New message      Pt had a cardioversion yesterday and was discharged.  Everytime the patient walks, her heart rate goes up in the 90's.  Usually it is in the 60's.  Please advise

## 2014-07-04 NOTE — Telephone Encounter (Signed)
Pt offered and took appt with Ladona Ridgelaylor tomorrow, but needs advice re heart rate being all over the place and if should take more flecinide? Pls advice

## 2014-07-05 ENCOUNTER — Encounter: Payer: Self-pay | Admitting: *Deleted

## 2014-07-05 ENCOUNTER — Other Ambulatory Visit: Payer: Self-pay | Admitting: *Deleted

## 2014-07-05 ENCOUNTER — Ambulatory Visit (INDEPENDENT_AMBULATORY_CARE_PROVIDER_SITE_OTHER): Payer: 59 | Admitting: Internal Medicine

## 2014-07-05 ENCOUNTER — Encounter: Payer: Self-pay | Admitting: Internal Medicine

## 2014-07-05 VITALS — BP 152/92 | HR 75 | Ht 68.0 in | Wt 262.2 lb

## 2014-07-05 DIAGNOSIS — I4891 Unspecified atrial fibrillation: Secondary | ICD-10-CM

## 2014-07-05 DIAGNOSIS — G4730 Sleep apnea, unspecified: Secondary | ICD-10-CM

## 2014-07-05 DIAGNOSIS — E669 Obesity, unspecified: Secondary | ICD-10-CM | POA: Insufficient documentation

## 2014-07-05 DIAGNOSIS — I10 Essential (primary) hypertension: Secondary | ICD-10-CM

## 2014-07-05 DIAGNOSIS — Z6832 Body mass index (BMI) 32.0-32.9, adult: Secondary | ICD-10-CM | POA: Insufficient documentation

## 2014-07-05 MED ORDER — FLECAINIDE ACETATE 100 MG PO TABS: ORAL_TABLET | ORAL | Status: DC

## 2014-07-05 NOTE — Assessment & Plan Note (Signed)
She has not had a sleep study. She has been reluctant to undergo this previously. I'll defer to Dr. Jenel LucksAllred's input about whether he thinks this should be done and if so before her ablation or after.

## 2014-07-05 NOTE — Assessment & Plan Note (Signed)
The patient has had both atrial fibrillation and flutter over the last several days. She has required cardioversion. This was despite taking extra doses of flecainide. Her flecainide appears to be less effective as she has had several episodes over the past 2 weeks. She denies noncompliance. At this point we discussed the treatment options. Her QT interval is slightly prolonged making dofetilide a suboptimal choice. At this point catheter ablation seems like the best option. I've asked her in the interim to up titrate her flecainide to 150 mg twice a day. She will come back in 2 weeks for a flecainide level and an ECG. She will be referred to my partner Dr. Johney FrameAllred to consider catheter ablation. As she has not had a 2-D echo in several years, this will be obtained to help us evaluate her left atrial dimensions and overall left ventricular systolic function in the setting of worsening atrial fibrillation and flutter.

## 2014-07-05 NOTE — Assessment & Plan Note (Signed)
Her blood pressure is elevated today. We discussed the importance a low-sodium diet, and I have asked her to make sure she takes her medications as prescribed. If her blood pressure remains elevated, we will uptitrate her calcium channel blocker.

## 2014-07-05 NOTE — Patient Instructions (Signed)
Your physician has recommended you make the following change in your medication:  1.) increase flecainide to 150 mg twice daily  You have been referred to Dr. Hillis RangeJames Allred to discuss an atrial fibrillation ABLATION.  We will call you to schedule appointment.  Your physician recommends that you schedule a follow-up appointment in: 10-14 days for EKG.  This can be scheduled at [address] or [place].   Your physician recommends that you return for lab work (FLECAINIDE TROUGH) ON THE SAME DAY AS EKG--IN THE MORNING.   Your physician has requested that you have an echocardiogram. Echocardiography is a painless test that uses sound waves to create images of your heart. It provides your doctor with information about the size and shape of your heart and how well your heart's chambers and valves are working. This procedure takes approximately one hour. There are no restrictions for this procedure.

## 2014-07-05 NOTE — Progress Notes (Signed)
HPI [name] returns today for followup. She is a very pleasant 64 year old woman with a history of paroxysmal atrial fibrillation, hypertension, and obesity. She has been in the hospital with atrial fib/flutter with an RVR, despite taking extra doses of flecainide. She has not had syncope. Still under lots of stress caring for her sick husband. She required DCCV to restore NSR.  Allergies  Allergen Reactions  . Shellfish-Derived Products Itching and Swelling       . Iodine Other (See Comments)    Unknown childhood reaction to topical iodine  . Contrast Media [Iodinated Diagnostic Agents] Other (See Comments)    Unknown reaction to topical iodine as a child  . Sulfonamide Derivatives Itching and Rash     Current Outpatient Prescriptions  Medication Sig Dispense Refill  . acetaminophen (TYLENOL) 650 MG CR tablet Take 650 mg by mouth at bedtime as needed for pain.    Marland Kitchen. ALPRAZolam (XANAX) 0.5 MG tablet Take 1 tablet (0.5 mg total) by mouth at bedtime as needed. (Patient taking differently: Take 0.5 mg by mouth daily as needed (afib attacks). ) 30 tablet 0  . CARTIA XT 120 MG 24 hr capsule TAKE 1 CAPSULE BY MOUTH DAILY 90 capsule 5  . flecainide (TAMBOCOR) 100 MG tablet TAKE 1 TABLET BY MOUTH IN THE MORNING AND 1 & 1/2 TABLETS BY MOUTH IN THE EVENING (Patient taking differently: Take 100-150 mg by mouth See admin instructions. Take 1 tablet (100 mg) every morning and 1 1/2 tablets (150 mg) at bedtime, may take up to 2 more tablets as needed for afib) 75 tablet 6  . mupirocin ointment (BACTROBAN) 2 % Place 1 application into the nose 2 (two) times daily. 22 g 0  . [name] 150 MG CAPS capsule TAKE 1 CAPSULE BY MOUTH EVERY 12 HOURS 60 capsule 10   No current facility-administered medications for this visit.     Past Medical History  Diagnosis Date  . Hypertension   . Diabetes mellitus   . Dyslipidemia   . Atrial fibrillation     ROS:   All systems reviewed and negative except as  noted in the HPI.   Past Surgical History  Procedure Laterality Date  . Appendectomy    . Tonsillectomy and adenoidectomy    . Knee surgery    . Replacement total knee      bilateral, different times  . Breast lumpectomy    . Cardioversion N/A 07/03/2014    Procedure: CARDIOVERSION;  Surgeon: [name] AbedJeffrey D Katz, MD;  [facility];  Service: Cardiovascular;  Laterality: N/A;     Family History  Problem Relation Age of Onset  . Heart attack Mother   . Heart disease Father     cabg x 7  . Heart attack Father   . Stroke Father      History   Social History  . Marital Status: Married    Spouse Name: N/A  . Number of Children: N/A  . Years of Education: N/A   Occupational History  . Not on file.   Social History Main Topics  . Smoking status: Never Smoker   . Smokeless tobacco: Never Used  . Alcohol Use: No  . Drug Use: No  . Sexual Activity: Not on file   Other Topics Concern  . Not on file   Social History Narrative     BP 152/92 mmHg  Pulse 75  Ht 5\' 8"  (1.727 m)  Wt 262 lb 3.2 oz (118.933 kg)  BMI 39.88  kg/m2  Physical Exam:  Well appearing obese, middle-age woman, NAD HEENT: Unremarkable Neck:  7 cm JVD, no thyromegally Lungs:  Clear with no wheezes, rales, or rhonchi. HEART:  Regular rate rhythm, no murmurs, no rubs, no clicks Abd:  soft, positive bowel sounds, no organomegally, no rebound, no guarding Ext:  2 plus pulses, no edema, no cyanosis, no clubbing Skin:  No rashes no nodules Neuro:  CN II through XII intact, motor grossly intact  EKG Normal sinus rhythm with normal axis and intervals  Assess/Plan:

## 2014-07-05 NOTE — Assessment & Plan Note (Signed)
This is clearly playing a role her illness. She will need work on lowering her weight.

## 2014-07-11 ENCOUNTER — Telehealth: Payer: Self-pay | Admitting: Internal Medicine

## 2014-07-11 NOTE — Telephone Encounter (Signed)
She called complaining of a tired feeling.  Almost like she has done manual work and she hasn't  BP 158/90 HR 62 but after sitting was 50 She is wondering if its due to the increase of Flecainide to 150 mg daily

## 2014-07-11 NOTE — Telephone Encounter (Signed)
New message    Patient calling   On Friday felt better, flecainide  medication was increase by Dr. Ladona Ridgelaylor. Over the weekend unable to do anything.   C/O at work hard to sit up, upper body feels really tired.   No chest pain,  No sob, No dizziness, no headache, no pressure , no tightness.

## 2014-07-12 ENCOUNTER — Ambulatory Visit (HOSPITAL_COMMUNITY): Payer: 59 | Attending: Cardiology

## 2014-07-12 DIAGNOSIS — I4891 Unspecified atrial fibrillation: Secondary | ICD-10-CM | POA: Diagnosis present

## 2014-07-12 NOTE — Progress Notes (Signed)
2D Echo completed. 07/12/2014 

## 2014-07-15 NOTE — Telephone Encounter (Signed)
Follow up       Last night pt had an attack of afib.  It lasted 4 hrs and converted back after taking an extra flecainide.  Pt took a total of 400mg  of flecainide.  Today she is drained.  Pt ask that someone call her today please

## 2014-07-15 NOTE — Telephone Encounter (Signed)
Spoke with the patient and gave her the echo results and let her know I would talk to Dr Ladona Ridgelaylor on Mon and let her know what he says

## 2014-07-18 ENCOUNTER — Other Ambulatory Visit (INDEPENDENT_AMBULATORY_CARE_PROVIDER_SITE_OTHER): Payer: 59

## 2014-07-18 DIAGNOSIS — I4891 Unspecified atrial fibrillation: Secondary | ICD-10-CM

## 2014-07-20 ENCOUNTER — Encounter: Payer: Self-pay | Admitting: *Deleted

## 2014-07-20 ENCOUNTER — Encounter: Payer: Self-pay | Admitting: Internal Medicine

## 2014-07-20 ENCOUNTER — Ambulatory Visit (INDEPENDENT_AMBULATORY_CARE_PROVIDER_SITE_OTHER): Payer: 59 | Admitting: Internal Medicine

## 2014-07-20 VITALS — BP 136/82 | HR 48 | Ht 68.0 in | Wt 260.4 lb

## 2014-07-20 DIAGNOSIS — I480 Paroxysmal atrial fibrillation: Secondary | ICD-10-CM

## 2014-07-20 DIAGNOSIS — G4730 Sleep apnea, unspecified: Secondary | ICD-10-CM

## 2014-07-20 DIAGNOSIS — I10 Essential (primary) hypertension: Secondary | ICD-10-CM | POA: Diagnosis not present

## 2014-07-20 LAB — CBC WITH DIFFERENTIAL/PLATELET
BASOS PCT: 0.9 % (ref 0.0–3.0)
Basophils Absolute: 0.1 10*3/uL (ref 0.0–0.1)
Eosinophils Absolute: 0.2 10*3/uL (ref 0.0–0.7)
Eosinophils Relative: 2.4 % (ref 0.0–5.0)
HCT: 42.5 % (ref 36.0–46.0)
Hemoglobin: 14.4 g/dL (ref 12.0–15.0)
LYMPHS ABS: 2.1 10*3/uL (ref 0.7–4.0)
Lymphocytes Relative: 29.7 % (ref 12.0–46.0)
MCHC: 33.9 g/dL (ref 30.0–36.0)
MCV: 81.4 fl (ref 78.0–100.0)
MONO ABS: 0.5 10*3/uL (ref 0.1–1.0)
MONOS PCT: 7 % (ref 3.0–12.0)
Neutro Abs: 4.2 10*3/uL (ref 1.4–7.7)
Neutrophils Relative %: 60 % (ref 43.0–77.0)
Platelets: 254 10*3/uL (ref 150.0–400.0)
RBC: 5.23 Mil/uL — AB (ref 3.87–5.11)
RDW: 14.2 % (ref 11.5–15.5)
WBC: 7 10*3/uL (ref 4.0–10.5)

## 2014-07-20 LAB — BASIC METABOLIC PANEL
BUN: 10 mg/dL (ref 6–23)
CO2: 27 mEq/L (ref 19–32)
CREATININE: 0.69 mg/dL (ref 0.40–1.20)
Calcium: 9.5 mg/dL (ref 8.4–10.5)
Chloride: 105 mEq/L (ref 96–112)
GFR: 91.63 mL/min (ref >60.00)
Glucose, Bld: 136 mg/dL — ABNORMAL HIGH (ref 70–99)
Potassium: 5.2 mEq/L — ABNORMAL HIGH (ref 3.5–5.1)
Sodium: 137 mEq/L (ref 135–145)

## 2014-07-20 NOTE — Patient Instructions (Signed)
Your physician has recommended that you have an ablation. Catheter ablation is a medical procedure used to treat some cardiac arrhythmias (irregular heartbeats). During catheter ablation, a long, thin, flexible tube is put into a blood vessel in your groin (upper thigh), or neck. This tube is called an ablation catheter. It is then guided to your heart through the blood vessel. Radio frequency waves destroy small areas of heart tissue where abnormal heartbeats may cause an arrhythmia to start. Please see the instruction sheet given to you today.  Your physician recommends that you return for lab work today BMP/CBC  See instruction sheet for procedure  Cardiac Ablation Cardiac ablation is a procedure to disable a small amount of heart tissue in very specific places. The heart has many electrical connections. Sometimes these connections are abnormal and can cause the heart to beat very fast or irregularly. By disabling some of the problem areas, heart rhythm can be improved or made normal. Ablation is done for people who:   Have Wolff-Parkinson-White syndrome.   Have other fast heart rhythms (tachycardia).   Have taken medicines for an abnormal heart rhythm (arrhythmia) that resulted in:   No success.   Side effects.   May have a high-risk heartbeat that could result in death.  LET [facility] PROVIDER KNOW ABOUT:   Any allergies you have or any previous reactions you have had to X-ray dye, food (such as seafood), medicine, or tape.   All medicines you are taking, including vitamins, herbs, eye drops, creams, and over-the-counter medicines.   Previous problems you or members of your family have had with the use of anesthetics.   Any blood disorders you have.   Previous surgeries or procedures (such as a kidney transplant) you have had.   Medical conditions you have (such as kidney failure).  RISKS AND COMPLICATIONS Generally, cardiac ablation is a safe procedure.  However, problems can occur and include:   Increased risk of cancer. Depending on how long it takes to do the ablation, the dose of radiation can be high.  Bruising and bleeding where a thin, flexible tube (catheter) was inserted during the procedure.   Bleeding into the chest, especially into the sac that surrounds the heart (serious).  Need for a permanent pacemaker if the normal electrical system is damaged.   The procedure may not be fully effective, and this may not be recognized for months. Repeat ablation procedures are sometimes required. BEFORE THE PROCEDURE   Follow any instructions from your health care provider regarding eating and drinking before the procedure.   Take your medicines as directed at regular times with water, unless instructed otherwise by your health care provider. If you are taking diabetes medicine, including insulin, ask how you are to take it and if there are any special instructions you should follow. It is common to adjust insulin dosing the day of the ablation.  PROCEDURE  An ablation is usually performed in a catheterization laboratory with the guidance of fluoroscopy. Fluoroscopy is a type of X-ray that helps your health care provider see images of your heart during the procedure.   An ablation is a minimally invasive procedure. This means a small cut (incision) is made in either your neck or groin. Your health care provider will decide where to make the incision based on your medical history and physical exam.  An IV tube will be started before the procedure begins. You will be given an anesthetic or medicine to help you relax (sedative).  The skin on your neck or groin will be numbed. A needle will be inserted into a large vein in your neck or groin and catheters will be threaded to your heart.  A special dye that shows up on fluoroscopy pictures may be injected through the catheter. The dye helps your health care provider see the area of the  heart that needs treatment.  The catheter has electrodes on the tip. When the area of heart tissue that is causing the arrhythmia is found, the catheter tip will send an electrical current to the area and "scar" the tissue. Three types of energy can be used to ablate the heart tissue:   Heat (radiofrequency energy).   Laser energy.   Extreme cold (cryoablation).   When the area of the heart has been ablated, the catheter will be taken out. Pressure will be held on the insertion site. This will help the insertion site clot and keep it from bleeding. A bandage will be placed on the insertion site.  AFTER THE PROCEDURE   After the procedure, you will be taken to a recovery area where your vital signs (blood pressure, heart rate, and breathing) will be monitored. The insertion site will also be monitored for bleeding.   You will need to lie still for 4-6 hours. This is to ensure you do not bleed from the catheter insertion site.  Document Released: 08/04/2008 Document Revised: 08/02/2013 Document Reviewed: 08/10/2012 [facility] Patient Information 2015 [place], [place]. This information is not intended to replace advice given to you by your health care provider. Make sure you discuss any questions you have with your health care provider.

## 2014-07-20 NOTE — Progress Notes (Signed)
Electrophysiology Office Note   Date:  07/20/2014   ID:  [name] Strickland, [name] Strickland [birth year], MRN [id number]  PCP:  Ruthe Mannan, MD  Cardiologist:  Dr Ladona Ridgel Primary Electrophysiologist: Dr Ladona Ridgel  Chief Complaint  Patient presents with  . Atrial Fibrillation     History of Present Illness: [name] Strickland is a 64 y.o. female who presents today for electrophysiology evaluation.   She reports initially being diagnosed with atrial fibrillation in 2012 after presenting with palpitations after the traumatic death of her son.  She had afib as well as a WCT and was hospitalized for several days days.  She has recently had increasing frequency and duration of atrial fibrillation despite flecainide  BID and diltiazem.  Further medical therapy is limited by bradycardia.  She has symptoms of tachypalpitations and decreased exercise tolerance.  She has DOE and when V rates are fast she is SOB at rest.  Her chads2vasc score is 2-3 (she denies DM) and is chronically anticoagulation with pradaxa.  She has required cardioversion 07/03/14 for atrial flutter  Today, she denies symptoms of chest pain, orthopnea, PND, lower extremity edema, claudication, dizziness, presyncope, syncope, bleeding, or neurologic sequela. The patient is tolerating medications without difficulties and is otherwise without complaint today.    Past Medical History  Diagnosis Date  . Hypertension   . Diabetes mellitus     pt is unaware and is on no medicine for this (A1C 5.9 2012)  . Dyslipidemia   . Paroxysmal atrial fibrillation     chads2vasc score of at least 2  . Typical atrial flutter   . Overweight   . Snoring     cancelled prior sleep study due to costs   Past Surgical History  Procedure Laterality Date  . Appendectomy    . Tonsillectomy and adenoidectomy    . Knee surgery    . Replacement total knee      bilateral, different times  . Breast lumpectomy      benign  . Cardioversion N/A 07/03/2014   Procedure: CARDIOVERSION;  Surgeon: Luis Abed, MD;  Location: [facility] OR;  Service: Cardiovascular;  Laterality: N/A;     Current Outpatient Prescriptions  Medication Sig Dispense Refill  . acetaminophen (TYLENOL) 650 MG CR tablet Take 650 mg by mouth at bedtime as needed for pain.    Marland Kitchen ALPRAZolam (XANAX) 0.5 MG tablet Take 1 tablet (0.5 mg total) by mouth at bedtime as needed. (Patient taking differently: Take 0.5 mg by mouth daily as needed (afib attacks). ) 30 tablet 0  . CARTIA XT 120 MG 24 hr capsule TAKE 1 CAPSULE BY MOUTH DAILY 90 capsule 5  . flecainide (TAMBOCOR) 100 MG tablet TAKE 1 AND 1/2  TABLETS (150 MG) TWICE DAILY (Patient taking differently: TAKE 1 AND 1/2  TABLETS (150 MG) BY MOUTH TWICE DAILY) 90 tablet 6  . [name] 150 MG CAPS capsule TAKE 1 CAPSULE BY MOUTH EVERY 12 HOURS 60 capsule 10   No current facility-administered medications for this visit.    Allergies:   Shellfish-derived products; Iodine; Contrast media; and Sulfonamide derivatives   Social History:  The patient  reports that she has never smoked. She has never used smokeless tobacco. She reports that she does not drink alcohol or use illicit drugs.   Family History:  The patient's  family history includes Heart attack in her father and mother; Heart disease in her father; Stroke in her father.    ROS:  Please see the history of  present illness.   All other systems are reviewed and negative.    PHYSICAL EXAM: VS:  BP 136/82 mmHg  Pulse 48  Ht  (1.727 m)  Wt 260 lb 6.4 oz (118.117 kg)  BMI 39.60 kg/m2 , BMI Body mass index is 39.6 kg/(m^2). GEN: Overweight, in no acute distress HEENT: normal Neck: no JVD, carotid bruits, or masses Cardiac: bradycardic regular rhythm; no murmurs, rubs, or gallops,no edema  Respiratory:  clear to auscultation bilaterally, normal work of breathing GI: soft, nontender, nondistended, + BS MS: no deformity or atrophy Skin: warm and dry  Neuro:  Strength and  sensation are intact Psych: euthymic mood, full affect  EKG:  EKG is ordered today. The ekg ordered today shows sinus bradycardia 50 bpm, PR 208, Qtc 428   Recent Labs: 07/02/2014: Hemoglobin 15.7*; Platelets 294; TSH 3.262 07/03/2014: BUN 7; Creatinine 0.64; Potassium 4.6; Sodium 136    Lipid Panel     Component Value Date/Time   CHOL * 05/19/2010 0548    204        ATP III CLASSIFICATION:  <200     mg/dL   Desirable  119-147  mg/dL   Borderline High  >=829    mg/dL   High          TRIG 562* 05/19/2010 0548   HDL 40 05/19/2010 0548   CHOLHDL 5.1 05/19/2010 0548   VLDL 45* 05/19/2010 0548   LDLCALC * 05/19/2010 0548    119        Total Cholesterol/HDL:CHD Risk Coronary Heart Disease Risk Table                     Men   Women  1/2 Average Risk   3.4   3.3  Average Risk       5.0   4.4  2 X Average Risk   9.6   7.1  3 X Average Risk  23.4   11.0        Use the calculated Patient Ratio above and the CHD Risk Table to determine the patient's CHD Risk.        ATP III CLASSIFICATION (LDL):  <100     mg/dL   Optimal  130-865  mg/dL   Near or Above                    Optimal  130-159  mg/dL   Borderline  784-696  mg/dL   High  >295     mg/dL   Very High     Wt Readings from Last 3 Encounters:  07/20/14 260 lb 6.4 oz (118.117 kg)  07/05/14 262 lb 3.2 oz (118.933 kg)  07/02/14 256 lb 6.3 oz (116.3 kg)      Other studies Reviewed: Additional studies/ records that were reviewed today include: Dr Bruna Potter notes, echo 4/16 Review of the above records today demonstrates:  LA size 49mm, preserved EF, no significant valvular disease   ASSESSMENT AND PLAN:  1.  Paroxysmal atrial fibrillation and atrial flutter The patient has symptomatic recurrent atrial arrhythmias.  She has failed medical therapy with flecainide.  Additional medical therapy is limited by bradycardia. Therapeutic strategies for afib and atrial flutter including medicine and ablation were discussed in  detail with the patient today. Risk, benefits, and alternatives to EP study and radiofrequency ablation were also discussed in detail today. These risks include but are not limited to stroke, bleeding, vascular damage, tamponade, perforation, damage to the esophagus,  lungs, and other structures, pulmonary vein stenosis, worsening renal function, and death. The patient understands these risk and wishes to proceed.  We will therefore proceed with catheter ablation at the next available time.  Chads2vasc score is at least 2.  Continue long term anticoagulation.  2. Obesity Weight loss is advised  3. HTN Stable No change required today  4. Snoring Sleep study is advised She will consider    Current medicines are reviewed at length with the patient today.   The patient does not have concerns regarding her medicines.  The following changes were made today:  none   Signed, Hillis RangeJames Darol Cush, MD  07/20/2014 9:30 AM     [facility] [address] [place] [address] [phone] (office) [phone] (fax)

## 2014-07-22 ENCOUNTER — Telehealth: Payer: Self-pay | Admitting: Internal Medicine

## 2014-07-22 NOTE — Telephone Encounter (Signed)
Called pt Not available Corrected FMLA is ready for pick up.

## 2014-07-22 NOTE — Telephone Encounter (Signed)
Spoke with pt she is aware FMLA completed copy mailed to pt home address.

## 2014-07-26 ENCOUNTER — Encounter (HOSPITAL_COMMUNITY)
Admission: RE | Disposition: A | Discharge status: Home / Self Care | Payer: Self-pay | Source: Ambulatory Visit | Attending: Internal Medicine

## 2014-07-26 ENCOUNTER — Encounter (HOSPITAL_COMMUNITY): Payer: Self-pay | Admitting: *Deleted

## 2014-07-26 ENCOUNTER — Ambulatory Visit (HOSPITAL_COMMUNITY): Payer: 59 | Admitting: Anesthesiology

## 2014-07-26 ENCOUNTER — Ambulatory Visit (HOSPITAL_COMMUNITY)
Admission: RE | Admit: 2014-07-26 | Discharge: 2014-07-27 | Disposition: A | Discharge status: Home / Self Care | Payer: 59 | Source: Ambulatory Visit | Attending: Internal Medicine | Admitting: Internal Medicine

## 2014-07-26 DIAGNOSIS — I483 Typical atrial flutter: Secondary | ICD-10-CM | POA: Diagnosis not present

## 2014-07-26 DIAGNOSIS — I10 Essential (primary) hypertension: Secondary | ICD-10-CM | POA: Insufficient documentation

## 2014-07-26 DIAGNOSIS — Z6839 Body mass index (BMI) 39.0-39.9, adult: Secondary | ICD-10-CM | POA: Diagnosis not present

## 2014-07-26 DIAGNOSIS — Z91041 Radiographic dye allergy status: Secondary | ICD-10-CM | POA: Insufficient documentation

## 2014-07-26 DIAGNOSIS — E785 Hyperlipidemia, unspecified: Secondary | ICD-10-CM | POA: Diagnosis not present

## 2014-07-26 DIAGNOSIS — E119 Type 2 diabetes mellitus without complications: Secondary | ICD-10-CM | POA: Diagnosis not present

## 2014-07-26 DIAGNOSIS — G4730 Sleep apnea, unspecified: Secondary | ICD-10-CM | POA: Insufficient documentation

## 2014-07-26 DIAGNOSIS — I340 Nonrheumatic mitral (valve) insufficiency: Secondary | ICD-10-CM | POA: Diagnosis not present

## 2014-07-26 DIAGNOSIS — I480 Paroxysmal atrial fibrillation: Secondary | ICD-10-CM | POA: Diagnosis not present

## 2014-07-26 DIAGNOSIS — I4891 Unspecified atrial fibrillation: Secondary | ICD-10-CM | POA: Diagnosis present

## 2014-07-26 HISTORY — PX: TEE WITHOUT CARDIOVERSION: SHX5443

## 2014-07-26 HISTORY — PX: ATRIAL FIBRILLATION ABLATION: SHX5456

## 2014-07-26 LAB — POCT ACTIVATED CLOTTING TIME
ACTIVATED CLOTTING TIME: 294 s
Activated Clotting Time: 294 seconds
Activated Clotting Time: 306 seconds
Activated Clotting Time: 165 seconds
Activated Clotting Time: 183 seconds

## 2014-07-26 LAB — MRSA PCR SCREENING: MRSA by PCR: NEGATIVE

## 2014-07-26 LAB — FLECAINIDE LEVEL: Flecainide: 0.48 ug/mL (ref 0.20–1.00)

## 2014-07-26 SURGERY — ATRIAL FIBRILLATION ABLATION
Anesthesia: Monitor Anesthesia Care

## 2014-07-26 SURGERY — ECHOCARDIOGRAM, TRANSESOPHAGEAL
Anesthesia: Moderate Sedation

## 2014-07-26 MED ORDER — PROMETHAZINE HCL 25 MG/ML IJ SOLN
6.2500 mg | INTRAMUSCULAR | Status: DC | PRN
Start: 2014-07-26 — End: 2014-07-27

## 2014-07-26 MED ORDER — LACTATED RINGERS IV SOLN
INTRAVENOUS | Status: DC | PRN
  Administered 2014-07-26: 11:00:00 via INTRAVENOUS

## 2014-07-26 MED ORDER — BUTAMBEN-TETRACAINE-BENZOCAINE 2-2-14 % EX AERO
INHALATION_SPRAY | CUTANEOUS | Status: DC | PRN
  Administered 2014-07-26: 2 via TOPICAL

## 2014-07-26 MED ORDER — ONDANSETRON HCL 4 MG/2ML IJ SOLN: 4.0000 mg | Freq: Four times a day (QID) | INTRAMUSCULAR | Status: DC | PRN

## 2014-07-26 MED ORDER — DIPHENHYDRAMINE HCL 50 MG/ML IJ SOLN
INTRAMUSCULAR | Status: AC
  Filled 2014-07-26: qty 1

## 2014-07-26 MED ORDER — FENTANYL CITRATE (PF) 100 MCG/2ML IJ SOLN: 25.0000 ug | INTRAMUSCULAR | Status: DC | PRN

## 2014-07-26 MED ORDER — SODIUM CHLORIDE 0.9 % IJ SOLN
3.0000 mL | Freq: Two times a day (BID) | INTRAMUSCULAR | Status: DC
  Administered 2014-07-26: 3 mL via INTRAVENOUS

## 2014-07-26 MED ORDER — DOBUTAMINE IN D5W 4-5 MG/ML-% IV SOLN
INTRAVENOUS | Status: AC
  Filled 2014-07-26: qty 250

## 2014-07-26 MED ORDER — PROTAMINE SULFATE 10 MG/ML IV SOLN
INTRAVENOUS | Status: DC | PRN
  Administered 2014-07-26: 30 mg via INTRAVENOUS

## 2014-07-26 MED ORDER — FENTANYL CITRATE (PF) 100 MCG/2ML IJ SOLN
INTRAMUSCULAR | Status: AC
  Filled 2014-07-26: qty 2

## 2014-07-26 MED ORDER — PROPOFOL 10 MG/ML IV BOLUS
INTRAVENOUS | Status: DC | PRN
  Administered 2014-07-26: 200 mg via INTRAVENOUS

## 2014-07-26 MED ORDER — LIDOCAINE HCL (CARDIAC) 20 MG/ML IV SOLN
INTRAVENOUS | Status: DC | PRN
  Administered 2014-07-26: 60 mg via INTRAVENOUS

## 2014-07-26 MED ORDER — SODIUM CHLORIDE 0.9 % IV SOLN
INTRAVENOUS | Status: DC
  Administered 2014-07-26: 500 mL via INTRAVENOUS

## 2014-07-26 MED ORDER — ACETAMINOPHEN 325 MG PO TABS: 650.0000 mg | ORAL_TABLET | ORAL | Status: DC | PRN

## 2014-07-26 MED ORDER — HEPARIN SODIUM (PORCINE) 1000 UNIT/ML IJ SOLN
INTRAMUSCULAR | Status: AC
  Filled 2014-07-26: qty 1

## 2014-07-26 MED ORDER — DEXAMETHASONE SODIUM PHOSPHATE 4 MG/ML IJ SOLN
INTRAMUSCULAR | Status: DC | PRN
  Administered 2014-07-26: 4 mg via INTRAVENOUS

## 2014-07-26 MED ORDER — FENTANYL CITRATE (PF) 100 MCG/2ML IJ SOLN
INTRAMUSCULAR | Status: DC | PRN
  Administered 2014-07-26 (×2): 25 ug via INTRAVENOUS

## 2014-07-26 MED ORDER — PHENOL 1.4 % MT LIQD
1.0000 | OROMUCOSAL | Status: DC | PRN
  Filled 2014-07-26: qty 177

## 2014-07-26 MED ORDER — HYDROCODONE-ACETAMINOPHEN 5-325 MG PO TABS
1.0000 | ORAL_TABLET | ORAL | Status: DC | PRN
  Administered 2014-07-26: 1 via ORAL
  Filled 2014-07-26: qty 1

## 2014-07-26 MED ORDER — DOBUTAMINE IN D5W 4-5 MG/ML-% IV SOLN
INTRAVENOUS | Status: DC | PRN
  Administered 2014-07-26: 20 ug/kg/min via INTRAVENOUS

## 2014-07-26 MED ORDER — ALPRAZOLAM 0.5 MG PO TABS: 0.5000 mg | ORAL_TABLET | Freq: Every evening | ORAL | Status: DC | PRN

## 2014-07-26 MED ORDER — ISOPROTERENOL HCL 0.2 MG/ML IJ SOLN
INTRAMUSCULAR | Status: AC
  Filled 2014-07-26: qty 5

## 2014-07-26 MED ORDER — SODIUM CHLORIDE 0.9 % IJ SOLN: 3.0000 mL | INTRAMUSCULAR | Status: DC | PRN

## 2014-07-26 MED ORDER — FENTANYL CITRATE (PF) 100 MCG/2ML IJ SOLN
INTRAMUSCULAR | Status: DC | PRN
  Administered 2014-07-26: 50 ug via INTRAVENOUS
  Administered 2014-07-26 (×6): 25 ug via INTRAVENOUS

## 2014-07-26 MED ORDER — MIDAZOLAM HCL 5 MG/ML IJ SOLN
INTRAMUSCULAR | Status: AC
  Filled 2014-07-26: qty 2

## 2014-07-26 MED ORDER — MIDAZOLAM HCL 5 MG/5ML IJ SOLN
INTRAMUSCULAR | Status: DC | PRN
  Administered 2014-07-26: 2 mg via INTRAVENOUS

## 2014-07-26 MED ORDER — HEPARIN SODIUM (PORCINE) 1000 UNIT/ML IJ SOLN
INTRAMUSCULAR | Status: DC | PRN
  Administered 2014-07-26: 3000 [IU] via INTRAVENOUS
  Administered 2014-07-26: 12000 [IU] via INTRAVENOUS
  Administered 2014-07-26: 3000 [IU] via INTRAVENOUS

## 2014-07-26 MED ORDER — MEPERIDINE HCL 25 MG/ML IJ SOLN: 6.2500 mg | INTRAMUSCULAR | Status: DC | PRN

## 2014-07-26 MED ORDER — MIDAZOLAM HCL 10 MG/2ML IJ SOLN
INTRAMUSCULAR | Status: DC | PRN
Start: 2014-07-26 — End: 2014-07-26
  Administered 2014-07-26 (×2): 2 mg via INTRAVENOUS

## 2014-07-26 MED ORDER — BUPIVACAINE HCL (PF) 0.25 % IJ SOLN
INTRAMUSCULAR | Status: AC
Start: 2014-07-26 — End: 2014-07-26
  Filled 2014-07-26: qty 30

## 2014-07-26 MED ORDER — SODIUM CHLORIDE 0.9 % IV SOLN: 250.0000 mL | INTRAVENOUS | Status: DC | PRN

## 2014-07-26 MED ORDER — ONDANSETRON HCL 4 MG/2ML IJ SOLN
INTRAMUSCULAR | Status: DC | PRN
  Administered 2014-07-26: 4 mg via INTRAVENOUS

## 2014-07-26 MED ORDER — DABIGATRAN ETEXILATE MESYLATE 150 MG PO CAPS
150.0000 mg | ORAL_CAPSULE | Freq: Two times a day (BID) | ORAL | Status: DC
  Administered 2014-07-26 – 2014-07-27 (×2): 150 mg via ORAL
  Filled 2014-07-26 (×3): qty 1

## 2014-07-26 NOTE — Transfer of Care (Signed)
Immediate Anesthesia Transfer of Care Note  Patient: [name] Strickland  Procedure(s) Performed: Procedure(s): ATRIAL FIBRILLATION ABLATION (N/A)  Patient Location: Cath Lab  Anesthesia Type:General  Level of Consciousness: awake, alert , oriented and patient cooperative  Airway & Oxygen Therapy: Patient Spontanous Breathing and Patient connected to face mask oxygen  Post-op Assessment: Report given to RN and Post -op Vital signs reviewed and stable  Post vital signs: Reviewed and stable  Last Vitals:  Filed Vitals:   07/26/14 1010  BP: 137/65  Pulse: 49  Temp:   Resp: 16    Complications: No apparent anesthesia complications

## 2014-07-26 NOTE — Anesthesia Preprocedure Evaluation (Addendum)
Anesthesia Evaluation  Patient identified by MRN, date of birth, ID band Patient awake    Reviewed: Allergy & Precautions, NPO status , Patient's Chart, lab work & pertinent test results  Airway Mallampati: II  TM Distance: >3 FB Neck ROM: Full    Dental no notable dental hx. (+) Teeth Intact, Dental Advisory Given   Pulmonary sleep apnea ,  breath sounds clear to auscultation  Pulmonary exam normal       Cardiovascular hypertension, Pt. on medications Rhythm:Regular Rate:Normal     Neuro/Psych negative neurological ROS  negative psych ROS   GI/Hepatic negative GI ROS, Neg liver ROS,   Endo/Other  diabetes, Type obesity  Renal/GU negative Renal ROS     Musculoskeletal negative musculoskeletal ROS (+)   Abdominal   Peds  Hematology negative hematology ROS (+)   Anesthesia Other Findings   Reproductive/Obstetrics negative OB ROS                            Anesthesia Physical  Anesthesia Plan  ASA: III  Anesthesia Plan: MAC   Post-op Pain Management:    Induction: Intravenous  Airway Management Planned: Simple Face Mask  Additional Equipment: None  Intra-op Plan:   Post-operative Plan:   Informed Consent: I have reviewed the patients History and Physical, chart, labs and discussed the procedure including the risks, benefits and alternatives for the proposed anesthesia with the patient or authorized representative who has indicated his/her understanding and acceptance.   Dental advisory given  Plan Discussed with: CRNA  Anesthesia Plan Comments:         Anesthesia Quick Evaluation

## 2014-07-26 NOTE — H&P (View-Only) (Signed)
HPI [name] returns today for followup. She is a very pleasant 64 year old woman with a history of paroxysmal atrial fibrillation, hypertension, and obesity. She has been in the hospital with atrial fib/flutter with an RVR, despite taking extra doses of flecainide. She has not had syncope. Still under lots of stress caring for her sick husband. She required DCCV to restore NSR.  Allergies  Allergen Reactions  . Shellfish-Derived Products Itching and Swelling       . Iodine Other (See Comments)    Unknown childhood reaction to topical iodine  . Contrast Media [Iodinated Diagnostic Agents] Other (See Comments)    Unknown reaction to topical iodine as a child  . Sulfonamide Derivatives Itching and Rash     Current Outpatient Prescriptions  Medication Sig Dispense Refill  . acetaminophen (TYLENOL) 650 MG CR tablet Take 650 mg by mouth at bedtime as needed for pain.    Marland Kitchen. ALPRAZolam (XANAX) 0.5 MG tablet Take 1 tablet (0.5 mg total) by mouth at bedtime as needed. (Patient taking differently: Take 0.5 mg by mouth daily as needed (afib attacks). ) 30 tablet 0  . CARTIA XT 120 MG 24 hr capsule TAKE 1 CAPSULE BY MOUTH DAILY 90 capsule 5  . flecainide (TAMBOCOR) 100 MG tablet TAKE 1 TABLET BY MOUTH IN THE MORNING AND 1 & 1/2 TABLETS BY MOUTH IN THE EVENING (Patient taking differently: Take 100-150 mg by mouth See admin instructions. Take 1 tablet (100 mg) every morning and 1 1/2 tablets (150 mg) at bedtime, may take up to 2 more tablets as needed for afib) 75 tablet 6  . mupirocin ointment (BACTROBAN) 2 % Place 1 application into the nose 2 (two) times daily. 22 g 0  . [name] 150 MG CAPS capsule TAKE 1 CAPSULE BY MOUTH EVERY 12 HOURS 60 capsule 10   No current facility-administered medications for this visit.     Past Medical History  Diagnosis Date  . Hypertension   . Diabetes mellitus   . Dyslipidemia   . Atrial fibrillation     ROS:   All systems reviewed and negative except as  noted in the HPI.   Past Surgical History  Procedure Laterality Date  . Appendectomy    . Tonsillectomy and adenoidectomy    . Knee surgery    . Replacement total knee      bilateral, different times  . Breast lumpectomy    . Cardioversion N/A 07/03/2014    Procedure: CARDIOVERSION;  Surgeon: [name] AbedJeffrey D Katz, MD;  [facility];  Service: Cardiovascular;  Laterality: N/A;     Family History  Problem Relation Age of Onset  . Heart attack Mother   . Heart disease Father     cabg x 7  . Heart attack Father   . Stroke Father      History   Social History  . Marital Status: Married    Spouse Name: N/A  . Number of Children: N/A  . Years of Education: N/A   Occupational History  . Not on file.   Social History Main Topics  . Smoking status: Never Smoker   . Smokeless tobacco: Never Used  . Alcohol Use: No  . Drug Use: No  . Sexual Activity: Not on file   Other Topics Concern  . Not on file   Social History Narrative     BP 152/92 mmHg  Pulse 75  Ht 5\' 8"  (1.727 m)  Wt 262 lb 3.2 oz (118.933 kg)  BMI 39.88  kg/m2  Physical Exam:  Well appearing obese, middle-age woman, NAD HEENT: Unremarkable Neck:  7 cm JVD, no thyromegally Lungs:  Clear with no wheezes, rales, or rhonchi. HEART:  Regular rate rhythm, no murmurs, no rubs, no clicks Abd:  soft, positive bowel sounds, no organomegally, no rebound, no guarding Ext:  2 plus pulses, no edema, no cyanosis, no clubbing Skin:  No rashes no nodules Neuro:  CN II through XII intact, motor grossly intact  EKG Normal sinus rhythm with normal axis and intervals  Assess/Plan:

## 2014-07-26 NOTE — Anesthesia Postprocedure Evaluation (Signed)
  Anesthesia Post-op Note  Patient: [name] Strickland  Procedure(s) Performed: Procedure(s): ATRIAL FIBRILLATION ABLATION (N/A)  Patient Location: PACU  Anesthesia Type:General  Level of Consciousness: awake  Airway and Oxygen Therapy: Patient Spontanous Breathing  Post-op Pain: mild  Post-op Assessment: Post-op Vital signs reviewed  Post-op Vital Signs: Reviewed  Last Vitals:  Filed Vitals:   07/26/14 1615  BP: 131/72  Pulse: 76  Temp:   Resp: 17    Complications: No apparent anesthesia complications

## 2014-07-26 NOTE — Interval H&P Note (Signed)
History and Physical Interval Note:  07/26/2014 8:04 AM  [name] Strickland  has presented today for surgery, with the diagnosis of AFIB  The various methods of treatment have been discussed with the patient and family. After consideration of risks, benefits and other options for treatment, the patient has consented to  Procedure(s): TRANSESOPHAGEAL ECHOCARDIOGRAM (TEE) (N/A) as a surgical intervention .  The patient's history has been reviewed, patient examined, no change in status, stable for surgery.  I have reviewed the patient's chart and labs.  Questions were answered to the patient's satisfaction.     [name]

## 2014-07-26 NOTE — CV Procedure (Signed)
See full TEE report in [employer]; normal LV function; mild biatrial enlargement; no LAA thrombus; mild MR. [name]

## 2014-07-26 NOTE — Progress Notes (Signed)
  Echocardiogram Echocardiogram Transesophageal has been performed.  [name] Strickland, [name] Strickland 07/26/2014, 8:25 AM

## 2014-07-26 NOTE — Progress Notes (Signed)
Received pt from endo/ post TEE, per nurse pt is post phase 2 no monitoring. TEE showed no thrombus. Pt Alert and oriented, talking with staff, denies pain.

## 2014-07-26 NOTE — Interval H&P Note (Signed)
History and Physical Interval Note:  07/26/2014 7:35 AM  [name] Strickland  has presented today for surgery, with the diagnosis of afib  The various methods of treatment have been discussed with the patient and family. After consideration of risks, benefits and other options for treatment, the patient has consented to  Procedure(s): ATRIAL FIBRILLATION ABLATION (N/A) as a surgical intervention .  The patient's history has been reviewed, patient examined, no change in status, stable for surgery.  I have reviewed the patient's chart and labs.  Questions were answered to the patient's satisfaction.     Hillis RangeJames Jakwon Gayton

## 2014-07-26 NOTE — H&P (View-Only) (Signed)
 Electrophysiology Office Note   Date:  07/20/2014   ID:  [name] Strickland, DOB 10/12/1950, MRN [id number]  PCP:  Talia [name], MD  Cardiologist:  Dr Taylor Primary Electrophysiologist: Dr Taylor  Chief Complaint  Patient presents with  . Atrial Fibrillation     History of Present Illness: [name] Strickland is a 64 y.o. female who presents today for electrophysiology evaluation.   She reports initially being diagnosed with atrial fibrillation in 2012 after presenting with palpitations after the traumatic death of her son.  She had afib as well as a WCT and was hospitalized for several days days.  She has recently had increasing frequency and duration of atrial fibrillation despite flecainide 150mg BID and diltiazem.  Further medical therapy is limited by bradycardia.  She has symptoms of tachypalpitations and decreased exercise tolerance.  She has DOE and when V rates are fast she is SOB at rest.  Her chads2vasc score is 2-3 (she denies DM) and is chronically anticoagulation with pradaxa.  She has required cardioversion 07/03/14 for atrial flutter  Today, she denies symptoms of chest pain, orthopnea, PND, lower extremity edema, claudication, dizziness, presyncope, syncope, bleeding, or neurologic sequela. The patient is tolerating medications without difficulties and is otherwise without complaint today.    Past Medical History  Diagnosis Date  . Hypertension   . Diabetes mellitus     pt is unaware and is on no medicine for this (A1C 5.9 2012)  . Dyslipidemia   . Paroxysmal atrial fibrillation     chads2vasc score of at least 2  . Typical atrial flutter   . Overweight   . Snoring     cancelled prior sleep study due to costs   Past Surgical History  Procedure Laterality Date  . Appendectomy    . Tonsillectomy and adenoidectomy    . Knee surgery    . Replacement total knee      bilateral, different times  . Breast lumpectomy      benign  . Cardioversion N/A 07/03/2014   Procedure: CARDIOVERSION;  Surgeon: Jeffrey D Katz, MD;  Location: [facility] OR;  Service: Cardiovascular;  Laterality: N/A;     Current Outpatient Prescriptions  Medication Sig Dispense Refill  . acetaminophen (TYLENOL) 650 MG CR tablet Take 650 mg by mouth at bedtime as needed for pain.    . ALPRAZolam (XANAX) 0.5 MG tablet Take 1 tablet (0.5 mg total) by mouth at bedtime as needed. (Patient taking differently: Take 0.5 mg by mouth daily as needed (afib attacks). ) 30 tablet 0  . CARTIA XT 120 MG 24 hr capsule TAKE 1 CAPSULE BY MOUTH DAILY 90 capsule 5  . flecainide (TAMBOCOR) 100 MG tablet TAKE 1 AND 1/2  TABLETS (150 MG) TWICE DAILY (Patient taking differently: TAKE 1 AND 1/2  TABLETS (150 MG) BY MOUTH TWICE DAILY) 90 tablet 6  . [name] 150 MG CAPS capsule TAKE 1 CAPSULE BY MOUTH EVERY 12 HOURS 60 capsule 10   No current facility-administered medications for this visit.    Allergies:   Shellfish-derived products; Iodine; Contrast media; and Sulfonamide derivatives   Social History:  The patient  reports that she has never smoked. She has never used smokeless tobacco. She reports that she does not drink alcohol or use illicit drugs.   Family History:  The patient's  family history includes Heart attack in her father and mother; Heart disease in her father; Stroke in her father.    ROS:  Please see the history of   present illness.   All other systems are reviewed and negative.    PHYSICAL EXAM: VS:  BP 136/82 mmHg  Pulse 48  Ht 5' 8" (1.727 m)  Wt 260 lb 6.4 oz (118.117 kg)  BMI 39.60 kg/m2 , BMI Body mass index is 39.6 kg/(m^2). GEN: Overweight, in no acute distress HEENT: normal Neck: no JVD, carotid bruits, or masses Cardiac: bradycardic regular rhythm; no murmurs, rubs, or gallops,no edema  Respiratory:  clear to auscultation bilaterally, normal work of breathing GI: soft, nontender, nondistended, + BS MS: no deformity or atrophy Skin: warm and dry  Neuro:  Strength and  sensation are intact Psych: euthymic mood, full affect  EKG:  EKG is ordered today. The ekg ordered today shows sinus bradycardia 50 bpm, PR 208, Qtc 428   Recent Labs: 07/02/2014: Hemoglobin 15.7*; Platelets 294; TSH 3.262 07/03/2014: BUN 7; Creatinine 0.64; Potassium 4.6; Sodium 136    Lipid Panel     Component Value Date/Time   CHOL * 05/19/2010 0548    204        ATP III CLASSIFICATION:  <200     mg/dL   Desirable  200-239  mg/dL   Borderline High  >=240    mg/dL   High          TRIG 224* 05/19/2010 0548   HDL 40 05/19/2010 0548   CHOLHDL 5.1 05/19/2010 0548   VLDL 45* 05/19/2010 0548   LDLCALC * 05/19/2010 0548    119        Total Cholesterol/HDL:CHD Risk Coronary Heart Disease Risk Table                     Men   Women  1/2 Average Risk   3.4   3.3  Average Risk       5.0   4.4  2 X Average Risk   9.6   7.1  3 X Average Risk  23.4   11.0        Use the calculated Patient Ratio above and the CHD Risk Table to determine the patient's CHD Risk.        ATP III CLASSIFICATION (LDL):  <100     mg/dL   Optimal  100-129  mg/dL   Near or Above                    Optimal  130-159  mg/dL   Borderline  160-189  mg/dL   High  >190     mg/dL   Very High     Wt Readings from Last 3 Encounters:  07/20/14 260 lb 6.4 oz (118.117 kg)  07/05/14 262 lb 3.2 oz (118.933 kg)  07/02/14 256 lb 6.3 oz (116.3 kg)      Other studies Reviewed: Additional studies/ records that were reviewed today include: Dr Taylors notes, echo 4/16 Review of the above records today demonstrates:  LA size 49mm, preserved EF, no significant valvular disease   ASSESSMENT AND PLAN:  1.  Paroxysmal atrial fibrillation and atrial flutter The patient has symptomatic recurrent atrial arrhythmias.  She has failed medical therapy with flecainide.  Additional medical therapy is limited by bradycardia. Therapeutic strategies for afib and atrial flutter including medicine and ablation were discussed in  detail with the patient today. Risk, benefits, and alternatives to EP study and radiofrequency ablation were also discussed in detail today. These risks include but are not limited to stroke, bleeding, vascular damage, tamponade, perforation, damage to the esophagus,   lungs, and other structures, pulmonary vein stenosis, worsening renal function, and death. The patient understands these risk and wishes to proceed.  We will therefore proceed with catheter ablation at the next available time.  Chads2vasc score is at least 2.  Continue long term anticoagulation.  2. Obesity Weight loss is advised  3. HTN Stable No change required today  4. Snoring Sleep study is advised She will consider    Current medicines are reviewed at length with the patient today.   The patient does not have concerns regarding her medicines.  The following changes were made today:  none   Signed, Angelicia Lessner, MD  07/20/2014 9:30 AM     [facility] [address] [place] [address] [phone] (office) [phone] (fax)  

## 2014-07-26 NOTE — Progress Notes (Signed)
Site area:right groin a 7, 9, and 11 french venous sheath was removed  Site Prior to Removal:  Level 0  Pressure Applied For 20 MINUTES    Minutes Beginning at 1720p  Manual:   Yes.    Patient Status During Pull:  stable  Post Pull Groin Site:  Level 0  Post Pull Instructions Given:  Yes.    Post Pull Pulses Present:  Yes.    Dressing Applied:  Yes.    Comments:  VS remain stable during sheath pull.  Pt denies any discomfort at site at this time

## 2014-07-26 NOTE — Op Note (Addendum)
SURGEON:  Hillis RangeJames Braylynn Ghan, MD First Assist:  Dr Excell Seltzerooper  PREPROCEDURE DIAGNOSES: 1. Paroxysmal atrial fibrillation. 2. Typical appearing atrial flutter  POSTPROCEDURE DIAGNOSES: 1. Paroxysmal  atrial fibrillation. 2. Typical appearing atrial flutter  PROCEDURES: 1. Comprehensive electrophysiologic study. 2. Coronary sinus pacing and recording. 3. Three-dimensional mapping of atrial fibrillation with additional mapping and ablation of a second discrete focus (atrial flutter) 4. Ablation of atrial fibrillation with additional mapping and ablation of a second discrete focus (atrial flutter) 5. Intracardiac echocardiography. 6. Transseptal puncture of an intact septum. 7. Rotational Angiography with processing at an independent workstation 8. Arrhythmia induction with pacing with dobutamine infusion 9. External cardioversion  INTRODUCTION:  [name] Strickland is a 64 y.o. female with a history of paroxysmal atrial fibrillation and typical appearing atrial flutter who now presents for EP study and radiofrequency ablation.  The patient reports initially being diagnosed with atrial fibrillation after presenting with symptomatic palpitations and fatgiue. The patient reports increasing frequency and duration of atrial arrhythmias since that time.  The patient has failed medical therapy with flecainide.  The patient therefore presents today for catheter ablation of atrial fibrillation and atrial flutter.  DESCRIPTION OF PROCEDURE:  Informed written consent was obtained, and the patient was brought to the electrophysiology lab in a fasting state.  The patient was adequately sedated with intravenous medications as outlined in the anesthesia report.  The patient's left and right groins were prepped and draped in the usual sterile fashion by the EP lab staff.  Using a percutaneous Seldinger technique, two 7-French and one 11-French hemostasis sheaths were placed into the right common femoral vein.    3 Dimensional  Rotational Angiography: A 5 french pigtail catheter was introduced through the right common femoral vein and advanced into the inferior venocava.  3 demential rotational angiography was then performed by power injection of 100cc of nonionic contrast.  Reprocessing at an independent work station was then performed.   This demonstrated a moderate sized left atrium with 4 separate pulmonary veins which were also moderate in size.  There were no anomalous veins or significant abnormalities.  A 3 dimensional rendering of the left atrium was then merged using NIKECarto Merge technology onto the WellPointCarto mapping system and registered with intracardiac echo (see below).  The pigtail catheter was then removed.  Catheter Placement:  A 7-French [employer] Decapolar coronary sinus catheter was introduced through the right common femoral vein and advanced into the coronary sinus for recording and pacing from this location.  A quadrapolar catheter was introduced through the right common femoral vein and advanced into the right ventricle for recording and pacing.  This catheter was then pulled back to the His bundle location.    Initial Measurements: The patient presented to the electrophysiology lab in sinus rhythm.  The patients PR interval measured 189 msec with a QRS duration of 106 msec and a QT interval of 478 msec.  The AH interval measured 83 msec and the HV interval measured 63 msec.     Intracardiac Echocardiography: A 10-French [employer] AcuNav intracardiac echocardiography catheter was introduced through the left common femoral vein and advanced into the right atrium. Intracardiac echocardiography was performed of the left atrium, and a three-dimensional anatomical rendering of the left atrium was performed using CARTO sound technology.  The patient was noted to have a moderate sized left atrium.  The interatrial septum was prominent but not aneurysmal. All 4 pulmonary veins were visualized and noted  to have separate ostia.  The  pulmonary veins were moderate in size.  The left atrial appendage was visualized and did not reveal thrombus.   There was no evidence of pulmonary vein stenosis.   Transseptal Puncture: The middle right common femoral vein sheath was exchanged for an 8.5 [place] SL2 transseptal sheath and transseptal access was achieved in a standard fashion using a Brockenbrough needle under biplane fluoroscopy with intracardiac echocardiography confirmation of the transseptal puncture.  Once transseptal access had been achieved, heparin was administered intravenously and intra- arterially in order to maintain an ACT of greater than 350 seconds throughout the procedure.   3D Mapping and Ablation: The His bundle catheter was removed and in its place a 3.5 mm [employer] ablation catheter was advanced into the right atrium.  The transseptal sheath was pulled back into the IVC over a guidewire.  The ablation catheter was advanced across the transseptal hole using the wire as a guide.  The transseptal sheath was then re-advanced over the guidewire into the left atrium.  A duodecapolar [employer] circular mapping catheter was introduced through the transseptal sheath and positioned over the mouth of all 4 pulmonary veins.  Three-dimensional electroanatomical mapping was performed using CARTO technology.  This demonstrated electrical activity within all four pulmonary veins at baseline. The patient underwent successful sequential electrical isolation and anatomical encircling of all four pulmonary veins using radiofrequency current with a circular mapping catheter as a guide.   The ablation catheter was then pulled back into the right atrial and positioned along the cavo-tricuspid isthmus.  Mapping along the atrial side of the isthmus was performed.  This demonstrated a standard isthmus.  A series of radiofrequency applications were then delivered along the isthmus.   Complete bidirectional cavotricuspid isthmus block was achieved as confirmed by differential atrial pacing from the low lateral right atrium.  A stimulus to earliest atrial activation across the isthmus measured 170 msec bi-directionally.  The patient was observe without return of conduction through the isthmus.  Measurements Following Ablation: Following ablation, Isuprel was dobutamine up to 20 mcg/kg/min with no inducible atrial tachycardia, atrial flutter, or sustained PACs.  With aggressive atrial pacing, the patient developed atrial fibrillation and was successfully cardioverted to sinus rhythm with a biphasic 360J shock delivered. In sinus rhythm with RR interval was 625 msec, with PR 186 msec, QRS 101 msec, and Qt 430 msec.  Following ablation the AH interval measured 69 msec with an HV interval of 60 msec. Ventricular pacing was performed, which revealed VA midline concentric decremental VA conduction with a VAWCL of 620 msec.  Rapid atrial pacing was performed, which revealed an AV Wenckebach cycle length of 330 msec.  PR>>RR but AVNRT was not demonstrated and has not been seen clinically.  I therefore did not perform slow pathway modification today.  Electroisolation was then again confirmed in all four pulmonary veins. Entrance and exit block were confirmed.  The procedure was therefore considered completed.  All catheters were removed, and the sheaths were aspirated and flushed.  The patient was transferred to the recovery area for sheath removal per protocol.  A limited bedside transthoracic echocardiogram revealed no pericardial effusion. EBL<61ml.  There were no early apparent complications.  CONCLUSIONS: 1. Sinus rhythm upon presentation.   2. Rotational Angiography reveals a moderate sized left atrium with four separate pulmonary veins without evidence of pulmonary vein stenosis. 3. Successful electrical isolation and anatomical encircling of all four pulmonary veins with radiofrequency  current.    4. Cavo-tricuspid isthmus ablation was  performed with complete bidirectional isthmus block achieved.  5. Atrial fibrillation induced with aggressive atrial pacing, successfully cardioverted to sinus rhythm 6. 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  Slow pathway ablation was therefore not performed today 7. No early apparent complications.   Nasser Ku,MD 3:18 PM 07/26/2014

## 2014-07-26 NOTE — Progress Notes (Signed)
Patient arrived to unit via bed, she is alert and oriented, has no complaint of pain or disocmofrt, family members at bedside, right groin has scant amount of blood on dressing per cath lab nurse that had been there, pulses strong distally, will continue to monitor closley.

## 2014-07-26 NOTE — Discharge Summary (Signed)
ELECTROPHYSIOLOGY PROCEDURE DISCHARGE SUMMARY    Patient ID: [name] Strickland,  MRN: [id number], DOB/AGE: March 22, 1951 64 y.o.  Admit date: 07/26/2014 Discharge date: 07/27/2014  Primary Care Physician: Ruthe Mannan, MD Primary Electrophysiologist: Ladona Ridgel  Primary Discharge Diagnosis:  Paroxysmal atrial fibrillation and atrial flutter status post ablation this admission  Secondary Discharge Diagnosis:  1.  Hypertension 2.  Diabetes - questionable diagnosis 3.  Obesity   Procedures This Admission:  1.  Electrophysiology study and radiofrequency catheter ablation on 07-26-14 by Dr [name].  This study demonstrated sinus rhythm upon presentation; rotational Angiography reveals a moderate sized left atrium with four separate pulmonary veins without evidence of pulmonary vein stenosis; successful electrical isolation and anatomical encircling of all four pulmonary veins with radiofrequency current; cavo-tricuspid isthmus ablation was performed with complete bidirectional isthmus block achieved; atrial fibrillation induced with aggressive atrial pacing, successfully cardioverted to sinus rhythm; dual AV nodal physiology without clinical AVNRT. Slow pathway ablation was therefore not performed today.  There were no early apparent complications..    Brief HPI: [name] Strickland is a 64 y.o. female with a history of paroxysmal atrial fibrillation and atrial flutter.  They have failed medical therapy with Flecainide. Risks, benefits, and alternatives to catheter ablation of atrial fibrillation were reviewed with the patient who wished to proceed.  The patient underwent TEE prior to the procedure which demonstrated normal LV function and no LAA thrombus.    Hospital Course:  The patient was admitted and underwent EPS/RFCA of atrial fibrillation with details as outlined above.  They were monitored on telemetry overnight which demonstrated sinus bradycardia.  Groin was without complication on the day of  discharge.  The patient was examined and considered to be stable for discharge.  Wound care and restrictions were reviewed with the patient.  The patient will be seen back by Dr Johney Frame in 12 weeks for post ablation follow up.   This patients CHA2DS2-VASc Score and unadjusted Ischemic Stroke Rate (% per year) is equal to 2.2 % stroke rate/year from a score of 2 Above score calculated as 1 point each if present [CHF, HTN, DM, Vascular=MI/PAD/Aortic Plaque, Age if 65-74, or Female] Above score calculated as 2 points each if present [Age > 75, or Stroke/TIA/TE]   Physical Exam: Filed Vitals:   07/26/14 2200 07/26/14 2300 07/27/14 0338 07/27/14 0745  BP: 125/56 130/61 132/59 128/59  Pulse: 59 62 55 52  Temp:  98.3 F (36.8 C) 98.2 F (36.8 C)   TempSrc:  Oral Oral   Resp: Height:   (1.727 m)    Weight:  256 lb 13.4 oz (116.5 kg)    SpO2: 95% 95% 97% 92%    GEN- The patient is well appearing, alert and oriented x 3 today.   HEENT: normocephalic, atraumatic; sclera clear, conjunctiva pink; hearing intact; oropharynx clear; neck supple, no JVP Lymph- no cervical lymphadenopathy Lungs- Clear to ausculation bilaterally, normal work of breathing.  No wheezes, rales, rhonchi Heart- Regular rate and rhythm, no murmurs, rubs or gallops  GI- soft, non-tender, non-distended, bowel sounds present  Extremities- no clubbing, cyanosis, or edema; DP/PT/radial pulses 2+ bilaterally, groin without hematoma/bruit MS- no significant deformity or atrophy Skin- warm and dry, no rash or lesion Psych- euthymic mood, full affect Neuro- strength and sensation are intact   Labs:   Lab Results  Component Value Date   WBC 7.0 07/20/2014   HGB 14.4 07/20/2014   HCT 42.5 07/20/2014  MCV 81.4 07/20/2014   PLT 254.0 07/20/2014     Recent Labs Lab 07/27/14 0346  NA 136  K 3.8  CL 104  CO2 24  BUN 11  CREATININE 0.75  CALCIUM 8.3*  GLUCOSE 132*     Discharge Medications:      Medication List    STOP taking these medications        CARTIA XT 120 MG 24 hr capsule  Generic drug:  diltiazem     flecainide 100 MG tablet  Commonly known as:  TAMBOCOR      TAKE these medications        acetaminophen 650 MG CR tablet  Commonly known as:  TYLENOL  Take 650 mg by mouth at bedtime as needed for pain.     ALPRAZolam 0.5 MG tablet  Commonly known as:  XANAX  Take 1 tablet (0.5 mg total) by mouth at bedtime as needed.     pantoprazole 40 MG tablet  Commonly known as:  PROTONIX  Take 1 tablet (40 mg total) by mouth daily.     [name] 150 MG Caps capsule  Generic drug:  dabigatran  TAKE 1 CAPSULE BY MOUTH EVERY 12 HOURS        Disposition:  Discharge Instructions    Diet - low sodium heart healthy    Complete by:  As directed      Increase activity slowly    Complete by:  As directed           Follow-up Information    Follow up with CARROLL,DONNA, NP On 08/23/2014.   Specialty:  Nurse Practitioner   Why:  at 1:30PM - their phone number is [phone]   Contact information:   [address] [place] [address] [phone]       Follow up with Hillis RangeJames Abryanna Musolino, MD On 10/26/2014.   Specialty:  Cardiology   Why:  at [facility]11AM   Contact information:   [address] [place] [address] [phone]       Duration of Discharge Encounter: Greater than 30 minutes including physician time.  Signed, Gypsy BalsamAmber Seiler, NP 07/27/2014 8:38 AM    Hillis RangeJames Filiberto Wamble MD, [facility] 07/27/2014 8:52 AM

## 2014-07-27 ENCOUNTER — Encounter (HOSPITAL_COMMUNITY): Payer: Self-pay | Admitting: Cardiology

## 2014-07-27 DIAGNOSIS — I480 Paroxysmal atrial fibrillation: Secondary | ICD-10-CM | POA: Diagnosis not present

## 2014-07-27 LAB — BASIC METABOLIC PANEL
Anion gap: 8 (ref 5–15)
BUN: 11 mg/dL (ref 6–23)
CHLORIDE: 104 mmol/L (ref 96–112)
CO2: 24 mmol/L (ref 19–32)
CREATININE: 0.75 mg/dL (ref 0.50–1.10)
Calcium: 8.3 mg/dL — ABNORMAL LOW (ref 8.4–10.5)
GFR calc Af Amer: >90 mL/min (ref >90)
GFR calc non Af Amer: 89 mL/min — ABNORMAL LOW (ref >90)
GLUCOSE: 132 mg/dL — AB (ref 70–99)
POTASSIUM: 3.8 mmol/L (ref 3.5–5.1)
Sodium: 136 mmol/L (ref 135–145)

## 2014-07-27 MED ORDER — PANTOPRAZOLE SODIUM 40 MG PO TBEC: 40.0000 mg | DELAYED_RELEASE_TABLET | Freq: Every day | ORAL | Status: DC

## 2014-07-27 NOTE — Progress Notes (Signed)
Dressing to right groin changed to band aid, level -0.

## 2014-07-27 NOTE — Discharge Instructions (Signed)
No driving for 4 days. No lifting over 5 lbs for 1 week. No sexual activity for 1 week. You may return to work on Monday 08/01/14. Keep procedure site clean & dry. If you notice increased pain, swelling, bleeding or pus, call/return!  You may shower, but no soaking baths/hot tubs/pools for 1 week.

## 2014-07-27 NOTE — Progress Notes (Signed)
Discharged home by wheelchair,  Stable, discharge instructions given, belongings with. Pt.

## 2014-07-27 NOTE — Progress Notes (Signed)
Utilization Review Completed.[name] Strickland, Masako Overall T4/27/2016

## 2014-08-01 ENCOUNTER — Telehealth (HOSPITAL_COMMUNITY): Payer: Self-pay | Admitting: *Deleted

## 2014-08-01 ENCOUNTER — Encounter: Payer: Self-pay | Admitting: *Deleted

## 2014-08-01 NOTE — Telephone Encounter (Signed)
Patient left voicemail on afib clinic phone early this morning wanting to discuss when she can return to work after her ablatoin she had performed on 4/26. Will forward to KimbertonKelly for review.

## 2014-08-01 NOTE — Telephone Encounter (Signed)
Spoke with patient and she anticipated being able to return to work today but is just not feeling up to that at this time.  I let her know I could write her out for 1 week from discharge day which means she would return on Wed 08/03/14

## 2014-08-15 ENCOUNTER — Telehealth: Payer: Self-pay | Admitting: Internal Medicine

## 2014-08-15 NOTE — Telephone Encounter (Signed)
Will move appointment up to tomorrow with Rudi Cocoonna Carroll, NP at 2:30pm per Loyal Jacobsononna  Melissa to call

## 2014-08-15 NOTE — Telephone Encounter (Signed)
New Message       Pt calling stating that she spoke to Cline CrockKathryn Thompson PA last night when she was on call and was told that she needed to get an appt as soon as possible, that she couldn't wait until her appt w/ Sebastian Acheonna Carrol on 08/23/14. Currently there are no notes in the system in regards to this conversation and there are no available appts prior to 08/23/14. Please call back and advise.

## 2014-08-15 NOTE — Telephone Encounter (Signed)
Will move her appointment up to see Rudi Cocoonna Carroll, NP

## 2014-08-16 ENCOUNTER — Encounter (HOSPITAL_COMMUNITY): Payer: Self-pay | Admitting: Nurse Practitioner

## 2014-08-16 ENCOUNTER — Telehealth: Payer: Self-pay | Admitting: Internal Medicine

## 2014-08-16 ENCOUNTER — Ambulatory Visit (HOSPITAL_COMMUNITY)
Admission: RE | Admit: 2014-08-16 | Discharge: 2014-08-16 | Disposition: A | Discharge status: Home / Self Care | Payer: 59 | Source: Ambulatory Visit | Attending: Nurse Practitioner | Admitting: Nurse Practitioner

## 2014-08-16 ENCOUNTER — Ambulatory Visit (HOSPITAL_COMMUNITY)
Admission: RE | Admit: 2014-08-16 | Discharge: 2014-08-16 | Disposition: A | Discharge status: Home / Self Care | Payer: 59 | Source: Ambulatory Visit

## 2014-08-16 VITALS — BP 138/98 | HR 83 | Ht 68.0 in | Wt 256.8 lb

## 2014-08-16 DIAGNOSIS — I482 Chronic atrial fibrillation, unspecified: Secondary | ICD-10-CM

## 2014-08-16 DIAGNOSIS — I481 Persistent atrial fibrillation: Secondary | ICD-10-CM | POA: Diagnosis present

## 2014-08-16 DIAGNOSIS — I4819 Other persistent atrial fibrillation: Secondary | ICD-10-CM

## 2014-08-16 NOTE — Telephone Encounter (Signed)
New message      For [name] Strickland Pt want you to know that she spoke with Vernona RiegerLaura on the 2nd floor of the heart unit at the hosp.  She was admitted from the cath lab to the cardiac unit to room 23 on 07-26-14. (You are getting a note for her)

## 2014-08-16 NOTE — Progress Notes (Signed)
Patient ID: [name] Strickland, female   DOB: [birth year], 64 y.o.   MRN: [id number]      Date:  08/16/2014   ID:  [name] Strickland, DOB [birth year], MRN [id number]  PCP:  Ruthe Mannanalia [name], MD  Cardiologist:  Dr Ladona Ridgelaylor Primary Electrophysiologist: Dr Ladona Ridgelaylor  Chief Complaint  Patient presents with  . Atrial Fibrillation     History of Present Illness: [name] Strickland is a 64 y.o. female who presents today in the afib clinc for evaluation of afib s/p PVI 4/25. She reports initially being diagnosed with atrial fibrillation in 2012 after presenting with palpitations after the traumatic death of her son.  She had afib as well as a WCT and was hospitalized for several  days.  She has recently had increasing frequency and duration of atrial fibrillation despite flecainide 150mg  BID and diltiazem.  Further medical therapy is limited by bradycardia.  She has symptoms of tachypalpitations and decreased exercise tolerance.  She has DOE and when V rates are fast she is SOB at rest.  Her chads2vasc score is 2-3 (she denies DM) and is chronically anticoagulation with pradaxa.  She has required cardioversion 07/03/14 for atrial flutter.  She reports today that she had 14 hours of afib over the weekend and was told to take flecainide 50 mg every 2 hours until converted. She is SR today. She also reports fatigue and lack of energy since the procedure and she thought she would have her energy back by now.Denies swallowing difficulties. Denies groin issues, bruising form procedure resolving. Does have increased burping but taking PPI. 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 Having a few sharp shooting chest pains, but rare.  Today, she denies symptoms of chest pain, orthopnea, PND, lower extremity edema, claudication, dizziness, presyncope, syncope, bleeding, or neurologic sequela. The patient is tolerating medications without difficulties and is otherwise without complaint today.     Past Medical History  Diagnosis Date  . Hypertension   . Diabetes mellitus     pt is unaware and is on no medicine for this (A1C 5.9 2012)  . Dyslipidemia   . Paroxysmal atrial fibrillation     chads2vasc score of at least 2  . Typical atrial flutter   . Overweight   . Snoring     cancelled prior sleep study due to costs   Past Surgical History  Procedure Laterality Date  . Appendectomy    . Tonsillectomy and adenoidectomy    . Knee surgery    . Replacement total knee      bilateral, different times  . Breast lumpectomy      benign  . Cardioversion N/A 07/03/2014    Procedure: CARDIOVERSION;  Surgeon: Luis AbedJeffrey D Katz, MD;  [facility];  Service: Cardiovascular;  Laterality: N/A;  . Atrial fibrillation ablation N/A 07/26/2014    Procedure: ATRIAL FIBRILLATION ABLATION;  Surgeon: Hillis RangeJames Allred, MD;  [facility];  Service: Cardiovascular;  Laterality: N/A;  . Tee without cardioversion N/A 07/26/2014    Procedure: TRANSESOPHAGEAL ECHOCARDIOGRAM (TEE);  Surgeon: Lewayne BuntingBrian S Crenshaw, MD;  [facility];  Service: Cardiovascular;  Laterality: N/A;     Current Outpatient Prescriptions  Medication Sig Dispense Refill  . acetaminophen (TYLENOL) 650 MG CR tablet Take 650 mg by mouth at bedtime as needed for pain.    Marland Kitchen. ALPRAZolam (XANAX) 0.5 MG tablet Take 1 tablet (0.5 mg total) by mouth at bedtime as needed. (Patient taking  differently: Take 0.5 mg by mouth daily as needed (afib attacks). ) 30 tablet 0  . pantoprazole (PROTONIX) 40 MG tablet Take 1 tablet (40 mg total) by mouth daily. 45 tablet 0  . [name] 150 MG CAPS capsule TAKE 1 CAPSULE BY MOUTH EVERY 12 HOURS 60 capsule 10   No current facility-administered medications for this encounter.    Allergies:   Shellfish-derived products; Iodine; Contrast media; and Sulfonamide derivatives   Social History:  The patient  reports that she has never smoked. She has never used smokeless tobacco. She reports that she  does not drink alcohol or use illicit drugs.   Family History:  The patient's  family history includes Heart attack in her father and mother; Heart disease in her father; Stroke in her father.    ROS:  Please see the history of present illness.   All other systems are reviewed and negative.    PHYSICAL EXAM: VS:  Bp 138/98, HR 83 bpm, HT 5'5" GEN: Overweight, in no acute distress  HEENT: normal Neck: no JVD, carotid bruits, or masses Cardiac: bradycardic regular rhythm; no murmurs, rubs, or gallops,no edema  Respiratory:  clear to auscultation bilaterally, normal work of breathing GI: soft, nontender, nondistended, + BS MS: no deformity or atrophy Skin: warm and dry  Neuro:  Strength and sensation are intact Psych: euthymic mood, full affect  EKG:  EKG is ordered today. The ekg ordered today shows SR 83 bpm   Recent Labs: 07/02/2014: TSH 3.262 07/20/2014: Hemoglobin 14.4; Platelets 254.0 07/27/2014: BUN 11; Creatinine 0.75; Potassium 3.8; Sodium 136      Wt Readings from Last 3 Encounters:  07/26/14 256 lb 13.4 oz (116.5 kg)  07/20/14 260 lb 6.4 oz (118.117 kg)  07/05/14 262 lb 3.2 oz (118.933 kg)      Other studies Reviewed: Epic records reviewed ECHO:  LA size 49mm, preserved EF, no significant valvular disease   ASSESSMENT AND PLAN:  1.  Paroxysmal atrial fibrillation and atrial flutter S/P PVI 07/25/14 Maintaining SR for most part Somewhat discouraged due to fatigue from procedure. Light duty note written for next two weeks Continue pradaxa  2. Obesity Weight loss is advised  3. HTN Stable No change required today  4. Snoring Sleep study is advised She will consider in next 6 months   F/u in afib clinc in 2 weeks. Dr. Johney FrameAllred as scheduled 7/1   Signed, Rudi CocoARROLL,Shireen Rayburn, NP  08/16/2014 2:48 PM

## 2014-08-16 NOTE — Patient Instructions (Signed)
Follow up in 2 weeks Light duty note for work provided

## 2014-08-16 NOTE — Telephone Encounter (Signed)
Patient called back and she is aware about letter and that we can not write as she was not in ICU

## 2014-08-16 NOTE — Telephone Encounter (Signed)
Tried to call patient back to let her know I discussed with Dr Johney FrameAllred and the unit she was on post ablation was a stepdown unit not ICU.  He says that is normal protocol and the procedure is considered an outpatient.  That is the way it is billed and he can not change that.  I was unable to leave her a message on her phone Will try back Thurs

## 2014-08-23 ENCOUNTER — Ambulatory Visit (HOSPITAL_COMMUNITY): Payer: 59 | Admitting: Nurse Practitioner

## 2014-08-30 ENCOUNTER — Encounter (HOSPITAL_COMMUNITY): Payer: Self-pay | Admitting: Nurse Practitioner

## 2014-08-30 ENCOUNTER — Ambulatory Visit (HOSPITAL_COMMUNITY)
Admission: RE | Admit: 2014-08-30 | Discharge: 2014-08-30 | Disposition: A | Discharge status: Home / Self Care | Payer: 59 | Source: Ambulatory Visit | Attending: Nurse Practitioner | Admitting: Nurse Practitioner

## 2014-08-30 VITALS — BP 140/84 | HR 82 | Ht 68.0 in | Wt 256.0 lb

## 2014-08-30 DIAGNOSIS — I10 Essential (primary) hypertension: Secondary | ICD-10-CM | POA: Insufficient documentation

## 2014-08-30 DIAGNOSIS — I481 Persistent atrial fibrillation: Secondary | ICD-10-CM | POA: Insufficient documentation

## 2014-08-30 DIAGNOSIS — R0683 Snoring: Secondary | ICD-10-CM | POA: Insufficient documentation

## 2014-08-30 DIAGNOSIS — I4819 Other persistent atrial fibrillation: Secondary | ICD-10-CM

## 2014-08-30 DIAGNOSIS — E669 Obesity, unspecified: Secondary | ICD-10-CM | POA: Insufficient documentation

## 2014-08-30 NOTE — Patient Instructions (Signed)
Call afib clinic as needed [phone]

## 2014-08-30 NOTE — Progress Notes (Signed)
Patient ID: [name] Strickland, female   DOB: [birth year], 64 y.o.   MRN: [id number]      Date:  08/30/2014   ID:  [name] Strickland [birth year], MRN [id number]  PCP:  Ruthe Mannan, MD  Cardiologist:  Dr Ladona Ridgel Primary Electrophysiologist: Dr Ladona Ridgel  Chief Complaint  Patient presents with  . Atrial Fibrillation     History of Present Illness: [name] Strickland is a 64 y.o. female  initially being diagnosed with atrial fibrillation in 2012 after presenting with palpitations after the traumatic death of her son.  She had afib as well as a WCT and was hospitalized for several  days.  She has recently had increasing frequency and duration of atrial fibrillation despite flecainide  BID and diltiazem.  Further medical therapy is limited by bradycardia.  She has symptoms of tachypalpitations and decreased exercise tolerance.  She has DOE and when V rates are fast she is SOB at rest.  Her chads2vasc score is 2-3 (she denies DM) and is chronically anticoagulation with pradaxa.  She has required cardioversion 07/03/14 for atrial flutter. She underwent afib ablation 07/26/14.  She was seen  5/17 for  fatigue and lack of energy since the procedure.  Denied swallowing difficulties. Denied groin issues, bruising form procedure resolving. Did have increased burping but taking PPI. 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 She had 10 hours of afib early on after ablation, converted with flecainide.  Now she is staring to feel improved and some energy is returning. 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 She had one further afib episode and returned to SR with one tab of Cardizem and  50 mg flecainide after 8 hours.  Today, she denies symptoms of chest pain, orthopnea, PND, lower extremity edema, claudication, dizziness, presyncope, syncope, bleeding, or neurologic sequela. The patient is tolerating medications  without difficulties and is otherwise without complaint today.    Past Medical History  Diagnosis Date  . Hypertension   . Diabetes mellitus     pt is unaware and is on no medicine for this (A1C 5.9 2012)  . Dyslipidemia   . Paroxysmal atrial fibrillation     chads2vasc score of at least 2  . Typical atrial flutter   . Overweight   . Snoring     cancelled prior sleep study due to costs   Past Surgical History  Procedure Laterality Date  . Appendectomy    . Tonsillectomy and adenoidectomy    . Knee surgery    . Replacement total knee      bilateral, different times  . Breast lumpectomy      benign  . Cardioversion N/A 07/03/2014    Procedure: CARDIOVERSION;  Surgeon: Luis Abed, MD;  [facility]: [facility] OR;  Service: Cardiovascular;  Laterality: N/A;  . Atrial fibrillation ablation N/A 07/26/2014    Procedure: ATRIAL FIBRILLATION ABLATION;  Surgeon: [name], MD;  Location: [facility] CATH LAB;  Service: Cardiovascular;  Laterality: N/A;  . Tee without cardioversion N/A 07/26/2014    Procedure: TRANSESOPHAGEAL ECHOCARDIOGRAM (TEE);  Surgeon: Lewayne Bunting, MD;  Location: [facility] ENDOSCOPY;  Service: Cardiovascular;  Laterality: N/A;     Current Outpatient Prescriptions  Medication Sig Dispense Refill  . acetaminophen (TYLENOL) 650 MG CR tablet Take 650 mg by mouth at bedtime as needed for pain.    Marland Kitchen ALPRAZolam (XANAX) 0.5 MG  tablet Take 1 tablet (0.5 mg total) by mouth at bedtime as needed. (Patient taking differently: Take 0.5 mg by mouth daily as needed (afib attacks). ) 30 tablet 0  . diltiazem (CARDIZEM CD) 120 MG 24 hr capsule Take 120 mg by mouth as needed (take as needed for fast heart rate).    . flecainide (TAMBOCOR) 50 MG tablet Take 50 mg by mouth as needed (take as needed for HR >120).    . pantoprazole (PROTONIX) 40 MG tablet Take 1 tablet (40 mg total) by mouth daily. 45 tablet 0  . [name] 150 MG CAPS capsule TAKE 1 CAPSULE BY MOUTH EVERY 12 HOURS 60 capsule 10   No  current facility-administered medications for this encounter.    Allergies:   Shellfish-derived products; Iodine; Contrast media; and Sulfonamide derivatives   Social History:  The patient  reports that she has never smoked. She has never used smokeless tobacco. She reports that she does not drink alcohol or use illicit drugs.   Family History:  The patient's  family history includes Heart attack in her father and mother; Heart disease in her father; Stroke in her father.    ROS:  Please see the history of present illness.   All other systems are reviewed and negative.    PHYSICAL EXAM: VS:  Bp 138/98, HR 83 bpm, HT 5'5" GEN: Overweight, in no acute distress  HEENT: normal Neck: no JVD, carotid bruits, or masses Cardiac: bradycardic regular rhythm; no murmurs, rubs, or gallops,no edema  Respiratory:  clear to auscultation bilaterally, normal work of breathing GI: soft, nontender, nondistended, + BS MS: no deformity or atrophy Skin: warm and dry  Neuro:  Strength and sensation are intact Psych: euthymic mood, full affect  EKG:  EKG is ordered today. The ekg ordered today shows SR 83 bpm   Recent Labs: 07/02/2014: TSH 3.262 07/20/2014: Hemoglobin 14.4; Platelets 254.0 07/27/2014: BUN 11; Creatinine 0.75; Potassium 3.8; Sodium 136      Wt Readings from Last 3 Encounters:  07/26/14 256 lb 13.4 oz (116.5 kg)  07/20/14 260 lb 6.4 oz (118.117 kg)  07/05/14 262 lb 3.2 oz (118.933 kg)      Other studies Reviewed: Epic records reviewed ECHO:  LA size 49mm, preserved EF, no significant valvular disease   ASSESSMENT AND PLAN:  1.  Paroxysmal atrial fibrillation and atrial flutter S/P PVI 07/25/14 Maintaining SR for most part Return to normal work duties Continue pradaxa without missed doses Can use Cardizem/ flecainide prn for breakthrough afib.  2. Obesity Weight loss is advised  3. HTN Stable No change required today  4. Snoring Sleep study is advised She will  consider in next 6 months   Dr. Johney FrameAllred as scheduled 7/1 Afib clinic as needed after that.   Don PerkingSigned, Bj Morlock, NP  08/30/2014 4:15 PM

## 2014-10-26 ENCOUNTER — Ambulatory Visit (INDEPENDENT_AMBULATORY_CARE_PROVIDER_SITE_OTHER): Payer: 59 | Admitting: Internal Medicine

## 2014-10-26 ENCOUNTER — Encounter: Payer: Self-pay | Admitting: Internal Medicine

## 2014-10-26 VITALS — BP 142/80 | HR 66 | Ht 68.0 in | Wt 254.6 lb

## 2014-10-26 DIAGNOSIS — I480 Paroxysmal atrial fibrillation: Secondary | ICD-10-CM | POA: Diagnosis not present

## 2014-10-26 NOTE — Patient Instructions (Signed)
Medication Instructions:  Your physician recommends that you continue on your current medications as directed. Please refer to the Current Medication list given to you today. 1) Take your Diltiazem 120 mg daily   Labwork: None ordered  Testing/Procedures: None ordered  Follow-Up:  Your physician recommends that you schedule a follow-up appointment in: 3 months with Dr Ladona Ridgel  Any Other Special Instructions Will Be Listed Below (If Applicable).

## 2014-10-26 NOTE — Progress Notes (Signed)
PCP: Ruthe Mannan, MD Primary EP: Dr Lamount Cranker [name] Strickland is a 64 y.o. female who presents today for routine electrophysiology followup.  Since her recent afib ablation, the patient reports doing very well. She denies procedure related complications.  She has had some ERAF post ablation.  This appears to be improving. Today, she denies symptoms of palpitations, chest pain, shortness of breath,  lower extremity edema, dizziness, presyncope, or syncope.  The patient is otherwise without complaint today.   Past Medical History  Diagnosis Date  . Hypertension   . Diabetes mellitus     pt is unaware and is on no medicine for this (A1C 5.9 2012)  . Dyslipidemia   . Paroxysmal atrial fibrillation     chads2vasc score of at least 2  . Typical atrial flutter   . Overweight   . Snoring     cancelled prior sleep study due to costs   Past Surgical History  Procedure Laterality Date  . Appendectomy    . Tonsillectomy and adenoidectomy    . Knee surgery    . Replacement total knee      bilateral, different times  . Breast lumpectomy      benign  . Cardioversion N/A 07/03/2014    Procedure: CARDIOVERSION;  Surgeon: Luis Abed, MD;  Location: [facility] OR;  Service: Cardiovascular;  Laterality: N/A;  . Atrial fibrillation ablation N/A 07/26/2014    Procedure: ATRIAL FIBRILLATION ABLATION;  Surgeon: [name], MD;  Location[facility] CATH LAB;  Service: Cardiovascular;  Laterality: N/A;  . Tee without cardioversion N/A 07/26/2014    Procedure: TRANSESOPHAGEAL ECHOCARDIOGRAM (TEE);  Surgeon: Lewayne Bunting, MD;  Location[facility] ENDOSCOPY;  Service: Cardiovascular;  Laterality: N/A;    ROS- all systems are reviewed and negatives except as per HPI above  Current Outpatient Prescriptions  Medication Sig Dispense Refill  . acetaminophen (TYLENOL) 650 MG CR tablet Take 650 mg by mouth at bedtime as needed for pain.    Marland Kitchen ALPRAZolam (XANAX) 0.5 MG tablet Take 0.5 mg by mouth at bedtime as needed (AFIB attack).     Marland Kitchen diltiazem (CARDIZEM CD) 120 MG 24 hr capsule Take 120 mg by mouth daily.    . flecainide (TAMBOCOR) 50 MG tablet Take 50 mg by mouth daily as needed (take as needed for HR >120).     . pantoprazole (PROTONIX) 40 MG tablet Take 1 tablet (40 mg total) by mouth daily. 45 tablet 0  . [name] 150 MG CAPS capsule TAKE 1 CAPSULE BY MOUTH EVERY 12 HOURS 60 capsule 10   No current facility-administered medications for this visit.    Physical Exam: Filed Vitals:   10/26/14 1108  BP: 142/80  Pulse: 66  Height:  (1.727 m)  Weight: 115.486 kg (254 lb 9.6 oz)    GEN- The patient is well appearing, alert and oriented x 3 today.   Head- normocephalic, atraumatic Eyes-  Sclera clear, conjunctiva pink Ears- hearing intact Oropharynx- clear Lungs- Clear to ausculation bilaterally, normal work of breathing Heart- Regular rate and rhythm, no murmurs, rubs or gallops, PMI not laterally displaced GI- soft, NT, ND, + BS Extremities- no clubbing, cyanosis, or edema  ekg today reveals sinus rhythm  Assessment and Plan:  1. afib Doing well s/p ablation Some ERAF which continues to improve Continue daily diltiazem Flecainide prn Continue anticoagulation  2. Obesity Body mass index is 38.72 kg/(m^2). Results of cardiofit and ARREST AF trials were again discussed with the patient.  I was  very clear with her that without lifestyle change, our ability to maintain sinus rhythm long term is probably low.  Follow-up with Dr Ladona Ridgel in 3 months I will see when needed going forward.  She has asked for FMLA papers to be filled out.  I have informed her that I do not fill out FMLA for afib and have encouraged her to work if possible even with afib using diltiazem and flecainide as instructed.

## 2014-12-02 ENCOUNTER — Telehealth: Payer: Self-pay | Admitting: Internal Medicine

## 2014-12-02 ENCOUNTER — Other Ambulatory Visit: Payer: Self-pay | Admitting: Family Medicine

## 2014-12-02 DIAGNOSIS — I480 Paroxysmal atrial fibrillation: Secondary | ICD-10-CM

## 2014-12-02 NOTE — Telephone Encounter (Signed)
Calling stating she woke up at 6 AM this morning in Afib and tach.  HR was 130-140. Was SOB while lying down but also when walking but she usually gets SOB when she is in Afib.  States she has taking her Diltiazem and then 30 min later took the Flecainide.  It has been 2 hrs since she took the medicine and still feels like she is in Afib. States the Afib lasts usually about 3-4 hrs. States over the past 4 wks. Had an ablation in April.  She has been in Afib at least once a week. No c/o of CP. She just wants to know if she should continue the same regimen or does the medication need to be changed.  Spoke w/Kelly Elpidio Galea- Dr. Jenel Lucks nurse who wants her to come in this morning to get EKG so it can be documented that she is in AFib.  She states that she can't come this morning because her husband is home bound and home health is coming at 9:15 and usually stay for about an hour. She states she also lives in [place] and takes about 45 min to get here.  Advised for her to call us when Home Health leaves to see if she is still in Afib because by them it will be longer than 3 hrs.  When we do EKG she needs to be in Afib so we can document it. Will wait for her to call back.

## 2014-12-02 NOTE — Telephone Encounter (Signed)
Called into [employer].

## 2014-12-02 NOTE — Telephone Encounter (Signed)
Last office visit 12/10/2012.  Ok to refill? 

## 2014-12-02 NOTE — Telephone Encounter (Signed)
New message   Pt states in afib and tachycardia Pt heart rate is 130-140 Pt had ablation in April Pt took Diltiazem  this morning and flecainide 30 minutes after diltiazem Does she need to continue taking medication as ordered or do changes need to be made to medication Last 4 weeks pt has one episode per week of afib and tachycardia lasting 3-4 hours per episode  Pt c/o Shortness Of Breath: STAT if SOB developed within the last 24 hours or pt is noticeably SOB on the phone  1. Are you currently SOB (can you hear that pt is SOB on the phone)? Yes  2. How long have you been experiencing SOB? This morning around 6am  3. Are you SOB when sitting or when up moving around? Both  4. Are you currently experiencing any other symptoms? No

## 2014-12-02 NOTE — Telephone Encounter (Signed)
9:30- [name] Strickland called back and states she is back in rhythm- which is about 3 hrs.  Advised she would not need to come in to get an EKG today.  Suggested if she goes into Afib during the day she should call the office and come in to have EKG or get one at her office. (she is a Engineer, civil (consulting)). She states she will call if occurs again.

## 2014-12-13 NOTE — Telephone Encounter (Signed)
Call from patient requesting outcome of discussions with Dr Ladona Ridgel about adjusting number of FMLA occurrences allowed for her Afib/Tachycardia.  [name] Strickland states she has more then three occurrences a year and is requesting the FMLA documentation be changed to two to three occurrences a month.  [name] Strickland initially called with request in early in September.  Explained clinical manager will follow up with Dr Ladona Ridgel when he is back in the office.

## 2014-12-14 NOTE — Telephone Encounter (Signed)
Discussed with Dr Ladona Ridgel and he suggest if she is having these episodes weekly she will need to have a 30 day monitor placed to assess her afib burden so he can make a decission in regards to her FMLA paperwork she wants changed

## 2014-12-19 ENCOUNTER — Ambulatory Visit (INDEPENDENT_AMBULATORY_CARE_PROVIDER_SITE_OTHER): Payer: 59

## 2014-12-19 DIAGNOSIS — I480 Paroxysmal atrial fibrillation: Secondary | ICD-10-CM

## 2014-12-26 ENCOUNTER — Telehealth (HOSPITAL_COMMUNITY): Payer: Self-pay | Admitting: Cardiology

## 2014-12-26 NOTE — Telephone Encounter (Signed)
[facility] event [facility].  The patient went into atrial fibrillation with HR around 130 tonight.   [employer] to call, if significantly symptomatic will be directed to ER.  If minimal symptoms, will be told to contact Dr Lubertha Basque office in the morning.   Marca Ancona 12/26/2014

## 2014-12-27 ENCOUNTER — Telehealth: Payer: Self-pay | Admitting: Internal Medicine

## 2014-12-27 NOTE — Telephone Encounter (Signed)
New message      Pt is wearing a heart monitor.  The monitor company called her at 11:15pm last night stating that they were getting abnormal readings and told her to call the office this am.

## 2014-12-27 NOTE — Telephone Encounter (Signed)
spoke with [name] Strickland and will need to have patient push button on monitor so we can see if and when she broke from afib.  I have tried her on home number twice it rings with no answer, I have called her office and she is not at the office today.  They are checking to see if anyone at the office has a different number.  That is the only number they have at the office also

## 2014-12-29 NOTE — Telephone Encounter (Signed)
Discussed with both Dr Ladona Ridgel and Dr Johney Frame and have asked that she continue to wear the monitor to assess afib burden.  She will need a follow up after monitor with Dr Johney Frame to discuss the possibility of repeat ablation

## 2015-01-09 ENCOUNTER — Telehealth: Payer: Self-pay | Admitting: Internal Medicine

## 2015-01-09 NOTE — Telephone Encounter (Signed)
New Message  Pt called states that she is currently having afib while wearing the monitor. She was advised to call and schedule EKG if she has any symptoms. Pt requests a call back to clarify if an EKG is needed while she is wearing the Heart monitor. Please call

## 2015-01-09 NOTE — Telephone Encounter (Signed)
Received rhythm strips from [employer] [facility]. Patient was in Atrial Flutter  HR 140-150. Consulted Dr. Anne Fu, DOD, he recommend if patient is still having symptoms to take another Flecainide 50 mg by mouth and wait 30 minutes and take another one if she is still feels like she is in A. Fib. Patient stated she was still in an irregular rhythm, but her heart rate has come down some. Patient appreciated the call and will take Dr. Anne Fu recommendations.

## 2015-01-09 NOTE — Telephone Encounter (Signed)
Patient is wearing her monitor that she had put on 12/19/2014. Patient states that her heart is racing, HR 140-150. Patient is taking her [name] and diltiazem, and she took a dose of Flecainide PRN. Patient stated the Flecainide did not work for her. Patient has push her alert button on her monitor several times today. No fax received from [facility] at this time. Patient advised that she does not have to come into the office to get an EKG, that we will receive  the monitor results when [facility] sends them.

## 2015-01-26 ENCOUNTER — Ambulatory Visit: Payer: 59 | Admitting: Internal Medicine

## 2015-02-06 ENCOUNTER — Ambulatory Visit: Payer: 59 | Admitting: Internal Medicine

## 2015-03-01 ENCOUNTER — Other Ambulatory Visit: Payer: Self-pay | Admitting: Internal Medicine

## 2015-03-01 ENCOUNTER — Ambulatory Visit: Payer: 59 | Admitting: Internal Medicine

## 2015-03-22 ENCOUNTER — Ambulatory Visit (INDEPENDENT_AMBULATORY_CARE_PROVIDER_SITE_OTHER): Payer: 59 | Admitting: Internal Medicine

## 2015-03-22 ENCOUNTER — Encounter: Payer: Self-pay | Admitting: Internal Medicine

## 2015-03-22 VITALS — BP 140/100 | HR 73 | Ht 68.0 in | Wt 252.4 lb

## 2015-03-22 DIAGNOSIS — I481 Persistent atrial fibrillation: Secondary | ICD-10-CM

## 2015-03-22 DIAGNOSIS — I10 Essential (primary) hypertension: Secondary | ICD-10-CM

## 2015-03-22 DIAGNOSIS — I4819 Other persistent atrial fibrillation: Secondary | ICD-10-CM

## 2015-03-22 NOTE — Progress Notes (Signed)
HPI [name] Strickland returns today for followup. She is a very pleasant 64 year old woman with a history of paroxysmal atrial fibrillation, hypertension, and obesity. She has been in the hospital with atrial fib/flutter with an RVR, despite taking extra doses of flecainide. She has not had syncope. Still under lots of stress caring for her sick husband. She required DCCV to restore NSR. She has undergone catheter ablation of her atrial fib. In the past few months since her ablation, her atrial fib has improved. Allergies  Allergen Reactions  . Shellfish-Derived Products Itching and Swelling       . Iodine Other (See Comments)    Unknown childhood reaction to topical iodine  . Contrast Media [Iodinated Diagnostic Agents] Other (See Comments)    Unknown reaction to topical iodine as a child  . Sulfonamide Derivatives Itching and Rash     Current Outpatient Prescriptions  Medication Sig Dispense Refill  . acetaminophen (TYLENOL) 650 MG CR tablet Take 650 mg by mouth at bedtime as needed for pain.    Marland Kitchen ALPRAZolam (XANAX) 0.5 MG tablet TAKE 1 TABLET BY MOUTH EVERY NIGHT AT BEDTIME AS NEEDED 30 tablet 1  . diltiazem (CARDIZEM CD) 120 MG 24 hr capsule TAKE 1 CAPSULE BY MOUTH DAILY 90 capsule 0  . flecainide (TAMBOCOR) 50 MG tablet Take 50 mg by mouth daily as needed (take as needed for HR >120).     . pantoprazole (PROTONIX) 40 MG tablet Take 1 tablet (40 mg total) by mouth daily. 45 tablet 0  . [name] 150 MG CAPS capsule TAKE 1 CAPSULE BY MOUTH EVERY 12 HOURS 60 capsule 1   No current facility-administered medications for this visit.     Past Medical History  Diagnosis Date  . Hypertension   . Diabetes mellitus     pt is unaware and is on no medicine for this (A1C 5.9 2012)  . Dyslipidemia   . Paroxysmal atrial fibrillation (HCC)     chads2vasc score of at least 2  . Typical atrial flutter (HCC)   . Overweight   . Snoring     cancelled prior sleep study due to costs    ROS:   All  systems reviewed and negative except as noted in the HPI.   Past Surgical History  Procedure Laterality Date  . Appendectomy    . Tonsillectomy and adenoidectomy    . Knee surgery    . Replacement total knee      bilateral, different times  . Breast lumpectomy      benign  . Cardioversion N/A 07/03/2014    Procedure: CARDIOVERSION;  Surgeon: Luis Abed, MD;  Location: [facility];  Service: Cardiovascular;  Laterality: N/A;  . Atrial fibrillation ablation N/A 07/26/2014    Procedure: ATRIAL FIBRILLATION ABLATION;  Surgeon: [name], MD;  Location: [facility] CATH LAB;  Service: Cardiovascular;  Laterality: N/A;  . Tee without cardioversion N/A 07/26/2014    Procedure: TRANSESOPHAGEAL ECHOCARDIOGRAM (TEE);  Surgeon: Lewayne Bunting, MD;  Location: [facility] ENDOSCOPY;  Service: Cardiovascular;  Laterality: N/A;     Family History  Problem Relation Age of Onset  . Heart attack Mother   . Heart disease Father     cabg x 7  . Heart attack Father   . Stroke Father      Social History   Social History  . Marital Status: Married    Spouse Name: N/A  . Number of Children: N/A  . Years of Education: N/A   Occupational History  .  Not on file.   Social History Main Topics  . Smoking status: Never Smoker   . Smokeless tobacco: Never Used  . Alcohol Use: No  . Drug Use: No  . Sexual Activity: Not on file   Other Topics Concern  . Not on file   Social History Narrative   [place].  Her husband is chronically ill with CHF and morbid obesity (420 lbs). She provides total care for him.   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     BP 140/100 mmHg  Pulse 73  Ht 5\' 8"  (1.727 m)  Wt 252 lb 6.4 oz (114.488 kg)  BMI 38.39 kg/m2  Physical Exam:  Well appearing obese, middle-age woman, NAD HEENT: Unremarkable except for poor dentition. Neck:  7 cm JVD, no thyromegally Lungs:  Clear with no wheezes, rales, or rhonchi. HEART:  Regular rate  rhythm, no murmurs, no rubs, no clicks Abd:  soft, positive bowel sounds, no organomegally, no rebound, no guarding Ext:  2 plus pulses, no edema, no cyanosis, no clubbing Skin:  No rashes no nodules Neuro:  CN II through XII intact, motor grossly intact  EKG Normal sinus rhythm with normal axis and intervals  Assess/Plan:

## 2015-03-22 NOTE — Patient Instructions (Signed)
Medication Instructions:  Your physician recommends that you continue on your current medications as directed. Please refer to the Current Medication list given to you today. May take an additional Flecainide 100mg  if afib last over an hour  Labwork: None ordered  Testing/Procedures: None ordered   Follow-Up: Your physician wants you to follow-up in: 6 months with Dr Court Joyaylor You will receive a reminder letter in the mail two months in advance. If you don't receive a letter, please call our office to schedule the follow-up appointment.   Any Other Special Instructions Will Be Listed Below (If Applicable).     If you need a refill on your cardiac medications before your next appointment, please call your pharmacy.

## 2015-03-22 NOTE — Assessment & Plan Note (Signed)
I continue to encourage her in weight loss. She is fairly sedentary.

## 2015-03-22 NOTE — Assessment & Plan Note (Signed)
Her blood pressure is not well controlled. Will follow.

## 2015-03-22 NOTE — Assessment & Plan Note (Signed)
Her atrial fib has improved in the past few months. She has continued her systemic anti-coagulation with [name].

## 2015-04-13 MED FILL — PRADAXA 150 MG CAPSULE: 150 | 30 days supply | Qty: 60 | Fill #1

## 2015-06-05 ENCOUNTER — Other Ambulatory Visit: Payer: Self-pay | Admitting: Internal Medicine

## 2015-06-05 MED FILL — PRADAXA 150 MG CAPSULE: 150 | 30 days supply | Qty: 60 | Fill #0

## 2015-08-17 ENCOUNTER — Other Ambulatory Visit: Payer: Self-pay

## 2015-08-17 MED ORDER — DILTIAZEM HCL ER COATED BEADS 120 MG PO CP24: 120.0000 mg | ORAL_CAPSULE | Freq: Every day | ORAL | Status: DC

## 2015-08-17 MED FILL — CARTIA XT 120 MG CAPSULE: 120 | 90 days supply | Qty: 90 | Fill #0

## 2015-08-17 MED FILL — PRADAXA 150 MG CAPSULE: 150 | 30 days supply | Qty: 60 | Fill #1

## 2015-09-26 ENCOUNTER — Encounter: Payer: Self-pay | Admitting: Family Medicine

## 2015-10-13 ENCOUNTER — Encounter: Payer: Self-pay | Admitting: Internal Medicine

## 2015-10-13 ENCOUNTER — Ambulatory Visit (INDEPENDENT_AMBULATORY_CARE_PROVIDER_SITE_OTHER): Payer: 59 | Admitting: Internal Medicine

## 2015-10-13 VITALS — BP 132/70 | HR 76 | Temp 98.2°F | Wt 258.0 lb

## 2015-10-13 DIAGNOSIS — M1990 Unspecified osteoarthritis, unspecified site: Secondary | ICD-10-CM | POA: Diagnosis not present

## 2015-10-13 DIAGNOSIS — M1380 Other specified arthritis, unspecified site: Secondary | ICD-10-CM | POA: Insufficient documentation

## 2015-10-13 LAB — CBC WITH DIFFERENTIAL/PLATELET
BASOS ABS: 0 10*3/uL (ref 0.0–0.1)
Basophils Relative: 0.7 % (ref 0.0–3.0)
Eosinophils Absolute: 0.2 10*3/uL (ref 0.0–0.7)
Eosinophils Relative: 3.1 % (ref 0.0–5.0)
HCT: 41.8 % (ref 36.0–46.0)
Hemoglobin: 13.8 g/dL (ref 12.0–15.0)
LYMPHS ABS: 1.6 10*3/uL (ref 0.7–4.0)
Lymphocytes Relative: 27.3 % (ref 12.0–46.0)
MCHC: 33.1 g/dL (ref 30.0–36.0)
MCV: 81.9 fl (ref 78.0–100.0)
MONO ABS: 0.5 10*3/uL (ref 0.1–1.0)
Monocytes Relative: 7.6 % (ref 3.0–12.0)
NEUTROS PCT: 61.3 % (ref 43.0–77.0)
Neutro Abs: 3.6 10*3/uL (ref 1.4–7.7)
Platelets: 227 10*3/uL (ref 150.0–400.0)
RBC: 5.1 Mil/uL (ref 3.87–5.11)
RDW: 14.8 % (ref 11.5–15.5)
WBC: 5.9 10*3/uL (ref 4.0–10.5)

## 2015-10-13 LAB — COMPREHENSIVE METABOLIC PANEL
ALK PHOS: 67 U/L (ref 39–117)
ALT: 18 U/L (ref 0–35)
AST: 19 U/L (ref 0–37)
Albumin: 4.4 g/dL (ref 3.5–5.2)
BILIRUBIN TOTAL: 0.7 mg/dL (ref 0.2–1.2)
BUN: 8 mg/dL (ref 6–23)
CALCIUM: 9.4 mg/dL (ref 8.4–10.5)
CO2: 27 mEq/L (ref 19–32)
Chloride: 103 mEq/L (ref 96–112)
Creatinine, Ser: 0.67 mg/dL (ref 0.40–1.20)
GFR: 94.41 mL/min (ref >60.00)
GLUCOSE: 114 mg/dL — AB (ref 70–99)
POTASSIUM: 3.9 meq/L (ref 3.5–5.1)
Sodium: 138 mEq/L (ref 135–145)
TOTAL PROTEIN: 7.6 g/dL (ref 6.0–8.3)

## 2015-10-13 LAB — RHEUMATOID FACTOR

## 2015-10-13 LAB — SEDIMENTATION RATE: SED RATE: 41 mm/h — AB (ref 0–30)

## 2015-10-13 NOTE — Progress Notes (Signed)
Subjective:    Patient ID: [name] Strickland, female    DOB: 1950/08/05, 65 y.o.   MRN: [id number]  HPI Here due to terrible hand swelling and pain last night MCPs were red this morning Couldn't make fist Some better with rubbing and hot water Has to pull husband out of bed with gait belt---severe pain today doing that  This was sudden Occasional shoulder and hip pain ---nothing striking (but is sleeping on couch since husband home from hospital and related to that)  No recent illness or fever No rash No swallowing problems  Tylenol not much help  Current Outpatient Prescriptions on File Prior to Visit  Medication Sig Dispense Refill  . acetaminophen (TYLENOL) 650 MG CR tablet Take 650 mg by mouth at bedtime as needed for pain.    Marland Kitchen. ALPRAZolam (XANAX) 0.5 MG tablet TAKE 1 TABLET BY MOUTH EVERY NIGHT AT BEDTIME AS NEEDED 30 tablet 1  . diltiazem (CARDIZEM CD) 120 MG 24 hr capsule Take 1 capsule (120 mg total) by mouth daily. 90 capsule 2  . flecainide (TAMBOCOR) 50 MG tablet Take 50 mg by mouth daily as needed (take as needed for HR >120).     . pantoprazole (PROTONIX) 40 MG tablet Take 1 tablet (40 mg total) by mouth daily. 45 tablet 0  . [name] 150 MG CAPS capsule TAKE 1 CAPSULE BY MOUTH EVERY 12 HOURS 60 capsule 3   No current facility-administered medications on file prior to visit.    Allergies  Allergen Reactions  . Shellfish-Derived Products Itching and Swelling       . Iodine Other (See Comments)    Unknown childhood reaction to topical iodine  . Contrast Media [Iodinated Diagnostic Agents] Other (See Comments)    Unknown reaction to topical iodine as a child  . Sulfonamide Derivatives Itching and Rash    Past Medical History  Diagnosis Date  . Hypertension   . Diabetes mellitus     [name] Strickland is unaware and is on no medicine for this (A1C 5.9 2012)  . Dyslipidemia   . Paroxysmal atrial fibrillation (HCC)     chads2vasc score of at least 2  . Typical atrial flutter  (HCC)   . Overweight   . Snoring     cancelled prior sleep study due to costs    Past Surgical History  Procedure Laterality Date  . Appendectomy    . Tonsillectomy and adenoidectomy    . Knee surgery    . Replacement total knee      bilateral, different times  . Breast lumpectomy      benign  . Cardioversion N/A 07/03/2014    Procedure: CARDIOVERSION;  Surgeon: [name] AbedJeffrey D Katz, MD;  [facility];  Service: Cardiovascular;  Laterality: N/A;  . Atrial fibrillation ablation N/A 07/26/2014    Procedure: ATRIAL FIBRILLATION ABLATION;  Surgeon: Hillis RangeJames Allred, MD;  [facility];  Service: Cardiovascular;  Laterality: N/A;  . Tee without cardioversion N/A 07/26/2014    Procedure: TRANSESOPHAGEAL ECHOCARDIOGRAM (TEE);  Surgeon: [name] BuntingBrian S Crenshaw, MD;  [facility];  Service: Cardiovascular;  Laterality: N/A;    Family History  Problem Relation Age of Onset  . Heart attack Mother   . Heart disease Father     cabg x 7  . Heart attack Father   . Stroke Father     Social History   Social History  . Marital Status: Married    Spouse Name: N/A  . Number of Children: N/A  .  Years of Education: N/A   Occupational History  . Not on file.   Social History Main Topics  . Smoking status: Never Smoker   . Smokeless tobacco: Never Used  . Alcohol Use: No  . Drug Use: No  . Sexual Activity: Not on file   Other Topics Concern  . Not on file   Social History Narrative   [name] Strickland lives in [place] with husband daughter.  Her husband is chronically ill with CHF and morbid obesity (420 lbs). She provides total care for him.   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   Review of Systems Some fatigue--but relates to night care for husband No night sweats Weight is up 10# due to not being able to walk regularly No FH of rheumatologic disease (except maybe her great great aunt)    Objective:   Physical Exam  Constitutional: She appears well-developed and  well-nourished. No distress.  Neck: Normal range of motion. No thyromegaly present.  Cardiovascular: Normal rate, regular rhythm and normal heart sounds.  Exam reveals no gallop.   No murmur heard. Pulmonary/Chest: Effort normal and breath sounds normal. No respiratory distress. She has no wheezes. She has no rales.  Abdominal: Soft. There is no tenderness.  Musculoskeletal: She exhibits no edema.  Mild bogginess without tenderness in PIP>MCP in hands No other active synovitis  Lymphadenopathy:    She has no cervical adenopathy.  Skin: No rash noted.          Assessment & Plan:

## 2015-10-13 NOTE — Progress Notes (Signed)
Pre visit review using our clinic review tool, if applicable. No additional management support is needed unless otherwise documented below in the visit note. 

## 2015-10-13 NOTE — Assessment & Plan Note (Signed)
Just started last night but had severe morning stiffness and some residual synovitis ??post infectious Is symmetric so will check RA serology Can't take NSAIDs If ongoing issues, will set up with rheumatology

## 2015-10-15 LAB — CYCLIC CITRUL PEPTIDE ANTIBODY, IGG/IGA: Cyclic Citrullin Peptide Ab: 6 units (ref 0–19)

## 2015-10-17 ENCOUNTER — Encounter: Payer: Self-pay | Admitting: Family Medicine

## 2015-10-17 ENCOUNTER — Ambulatory Visit (INDEPENDENT_AMBULATORY_CARE_PROVIDER_SITE_OTHER): Payer: 59 | Admitting: Family Medicine

## 2015-10-17 VITALS — BP 136/76 | HR 60 | Temp 97.7°F | Wt 258.0 lb

## 2015-10-17 DIAGNOSIS — M1990 Unspecified osteoarthritis, unspecified site: Secondary | ICD-10-CM

## 2015-10-17 MED ORDER — PREDNISONE 10 MG PO TABS: ORAL_TABLET | ORAL | Status: DC

## 2015-10-17 MED FILL — predniSONE 10 MG TABS: 10 | 12 days supply | Qty: 24 | Fill #0

## 2015-10-17 NOTE — Progress Notes (Signed)
Pre visit review using our clinic review tool, if applicable. No additional management support is needed unless otherwise documented below in the visit note.  No preceding URI/illness before the waxing and waning B hand pain started.  Prev with joint swelling and redness in the hands, mainly at the  B MCPs.  No FCNAVD.  No h/o gout.    She had another flare of pain this AM, w/o redness.  Pain some better by later in the AM today by time of OV.    No trauma.    Meds, vitals, and allergies reviewed.   ROS: Per HPI unless specifically indicated in ROS section   nad ncat Normal ROM at the B elbows.  No active synovitis at B elbows.   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   Normal ROM overall at the hand but pain with making a fist.  Still able to make a fist though.  Distally NV intact.

## 2015-10-17 NOTE — Patient Instructions (Addendum)
We can consider checking an xray and uric acid test later on.  Go ahead and try to set up rheumatology appointment the plan to cancel if doing better.   I would start the prednisone in the meantime, with food.  Take care.  Glad to see you.  Update me tomorrow.

## 2015-10-18 ENCOUNTER — Telehealth: Payer: Self-pay | Admitting: Family Medicine

## 2015-10-18 NOTE — Telephone Encounter (Signed)
[name] Strickland wanted to update you on the pain in her hands.  She took the prednisone and by bedtime she says a lot of the swelling and pain had decreased.  This morning she has very little swelling and slight sensitivity, but overall she says 99% better and is very appreciative.  Thank you.

## 2015-10-18 NOTE — Telephone Encounter (Signed)
Noted. Thanks.

## 2015-10-18 NOTE — Assessment & Plan Note (Signed)
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  Prev labs unremarkable except for minimal inc in ESR.  D/w pt.  She could still have seroneg RA.   Defer other testing and xray for now.  Would start pred taper with routine cautions.  She'll update me . D/w pt about rheum eval.  Would be reasonable to go ahead and set up, with the option to cancel should all of he sx resolve.   D/w pt about possible scenario: she may have transient sx that resolve, w/o clear dx.  She understood.  Okay for outpatient f/u.  pred is likely best option for tx at this point, given her other issues.  She agrees.

## 2015-10-26 MED FILL — PRADAXA 150 MG CAPSULE: 150 | 30 days supply | Qty: 60 | Fill #2

## 2015-11-21 ENCOUNTER — Encounter: Payer: Self-pay | Admitting: Internal Medicine

## 2015-11-22 ENCOUNTER — Ambulatory Visit (INDEPENDENT_AMBULATORY_CARE_PROVIDER_SITE_OTHER): Payer: 59 | Admitting: Internal Medicine

## 2015-11-22 ENCOUNTER — Encounter: Payer: Self-pay | Admitting: Internal Medicine

## 2015-11-22 DIAGNOSIS — I480 Paroxysmal atrial fibrillation: Secondary | ICD-10-CM

## 2015-11-22 MED ORDER — FLECAINIDE ACETATE 100 MG PO TABS: ORAL_TABLET | ORAL | 12 refills | Status: DC

## 2015-11-22 MED FILL — FLECAINIDE ACETATE 100 MG T: 100 | 24 days supply | Qty: 70 | Fill #0

## 2015-11-22 NOTE — Patient Instructions (Signed)
Medication Instructions:   Your physician has recommended you make the following change in your medication:  1) Take Flecainide 100mg  twice daily and may take an additional 100mg  in a 24 hour period.  Do not exceed 300mg  daily   Labwork: None ordered   Testing/Procedures: Your physician has requested that you have an exercise tolerance test. For further information please visit [email or web address]. Please also follow instruction sheet, as given.    Follow-Up: Your physician wants you to follow-up in: 12 months with Dr Court Joyaylor You will receive a reminder letter in the mail two months in advance. If you don't receive a letter, please call our office to schedule the follow-up appointment.   Thank you for choosing [facility]!!     Dennis BastKelly Lanier, RN [phone]

## 2015-11-22 NOTE — Progress Notes (Signed)
HPI [name] Strickland returns today for followup. She is a very pleasant 65 year old woman with a history of paroxysmal atrial fibrillation, hypertension, and obesity. She is s/p ablation. She has had increasingly frequent episodes of recurrent Atrial fib lasting up to 2-3 hours. She was taking as needed flecainide. No syncope. No sob. No edema.  Allergies  Allergen Reactions  . Shellfish-Derived Products Itching and Swelling       . Iodine Other (See Comments)    Unknown childhood reaction to topical iodine  . Contrast Media [Iodinated Diagnostic Agents] Other (See Comments)    Unknown reaction to topical iodine as a child  . Sulfonamide Derivatives Itching and Rash     Current Outpatient Prescriptions  Medication Sig Dispense Refill  . acetaminophen (TYLENOL) 650 MG CR tablet Take 650 mg by mouth at bedtime as needed for pain.    Marland Kitchen. ALPRAZolam (XANAX) 0.5 MG tablet as directed. TAKE 1 TABLET BY MOUTH EVERY NIGHT AT BEDTIME AS NEEDED    . diltiazem (CARDIZEM CD) 120 MG 24 hr capsule Take 1 capsule (120 mg total) by mouth daily. 90 capsule 2  . flecainide (TAMBOCOR) 100 MG tablet Take 100 mg by mouth as directed. Take as needed    . pantoprazole (PROTONIX) 40 MG tablet Take 1 tablet (40 mg total) by mouth daily. 45 tablet 0  . [name] 150 MG CAPS capsule TAKE 1 CAPSULE BY MOUTH EVERY 12 HOURS 60 capsule 3   No current facility-administered medications for this visit.      Past Medical History:  Diagnosis Date  . Diabetes mellitus    pt is unaware and is on no medicine for this (A1C 5.9 2012)  . Dyslipidemia   . Hypertension   . Overweight   . Paroxysmal atrial fibrillation (HCC)    chads2vasc score of at least 2  . Snoring    cancelled prior sleep study due to costs  . Typical atrial flutter (HCC)     ROS:   All systems reviewed and negative except as noted in the HPI.   Past Surgical History:  Procedure Laterality Date  . APPENDECTOMY    . ATRIAL FIBRILLATION ABLATION N/A  07/26/2014   Procedure: ATRIAL FIBRILLATION ABLATION;  Surgeon: Hillis RangeJames Allred, MD;  [facility];  Service: Cardiovascular;  Laterality: N/A;  . BREAST LUMPECTOMY     benign  . CARDIOVERSION N/A 07/03/2014   Procedure: CARDIOVERSION;  Surgeon: Luis AbedJeffrey D Katz, MD;  [facility];  Service: Cardiovascular;  Laterality: N/A;  . KNEE SURGERY    . REPLACEMENT TOTAL KNEE     bilateral, different times  . TEE WITHOUT CARDIOVERSION N/A 07/26/2014   Procedure: TRANSESOPHAGEAL ECHOCARDIOGRAM (TEE);  Surgeon: Lewayne BuntingBrian S Crenshaw, MD;  [facility];  Service: Cardiovascular;  Laterality: N/A;  . TONSILLECTOMY AND ADENOIDECTOMY       Family History  Problem Relation Age of Onset  . Heart attack Mother   . Heart disease Father     cabg x 7  . Heart attack Father   . Stroke Father      Social History   Social History  . Marital status: Married    Spouse name: N/A  . Number of children: N/A  . Years of education: N/A   Occupational History  . Not on file.   Social History Main Topics  . Smoking status: Never Smoker  . Smokeless tobacco: Never Used  . Alcohol use No  . Drug use: No  . Sexual activity: Not  on file   Other Topics Concern  . Not on file   Social History Narrative   [place] in [place].  Her husband is chronically ill with CHF and morbid obesity (420 lbs). She provides total care for him.   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     BP 136/90   Pulse 69   Ht 5\' 8"  (1.727 m)   Wt 257 lb 3.2 oz (116.7 kg)   BMI 39.11 kg/m   Physical Exam:  Well appearing obese, middle-age woman, NAD HEENT: Unremarkable except for poor dentition. Neck:  7 cm JVD, no thyromegally Lungs:  Clear with no wheezes, rales, or rhonchi. HEART:  Regular rate rhythm, no murmurs, no rubs, no clicks Abd:  soft, positive bowel sounds, no organomegally, no rebound, no guarding Ext:  2 plus pulses, trace peripheral edema, no cyanosis, no  clubbing Skin:  No rashes no nodules Neuro:  CN II through XII intact, motor grossly intact  EKG Normal sinus rhythm with normal axis and intervals  Assess/Plan:  1. Atrial fib - her symptoms have increased in frequency. We will ask her to start flecainide 100 bid. Will schedule her to return for GXT in 3 weeks.  2. Obesity - weight loss is encouraged 3. HTN - her blood pressure is reasonably well controlled.  4. coags - she will continue pradaxa  Leonia ReevesGregg Nolyn Eilert,M.D.

## 2015-12-07 MED FILL — CARTIA XT 120 MG CAPSULE SA: 120 | 90 days supply | Qty: 90 | Fill #1

## 2015-12-20 ENCOUNTER — Ambulatory Visit (INDEPENDENT_AMBULATORY_CARE_PROVIDER_SITE_OTHER): Payer: 59

## 2015-12-20 ENCOUNTER — Telehealth: Payer: Self-pay | Admitting: Internal Medicine

## 2015-12-20 DIAGNOSIS — I480 Paroxysmal atrial fibrillation: Secondary | ICD-10-CM | POA: Diagnosis not present

## 2015-12-20 LAB — EXERCISE TOLERANCE TEST
CHL CUP MPHR: 157 {beats}/min
CSEPED: 3 min
CSEPPHR: 131 {beats}/min
Estimated workload: 4.7 METS
Exercise duration (sec): 9 s
Percent HR: 83 %
RPE: 17
Rest HR: 68 {beats}/min

## 2015-12-20 MED FILL — FLECAINIDE ACETATE 100 MG T: 100 | 24 days supply | Qty: 70 | Fill #1

## 2015-12-20 NOTE — Telephone Encounter (Signed)
Patient called in asking for FMLA to be refaxed. This was faxed to Eveline KetoErika Rojas [employer]- Patient stated it wasn't received the first time.

## 2016-01-04 MED FILL — PRADAXA 150 MG CAPSULE: 150 | 30 days supply | Qty: 60 | Fill #3

## 2016-01-17 MED FILL — FLECAINIDE ACETATE 100 MG T: 100 | 24 days supply | Qty: 70 | Fill #2

## 2016-03-22 ENCOUNTER — Other Ambulatory Visit: Payer: Self-pay | Admitting: Internal Medicine

## 2016-03-22 MED FILL — CARTIA XT 120 MG CAPSULE SA: 120 | 30 days supply | Qty: 30 | Fill #2

## 2016-03-22 MED FILL — FLECAINIDE ACETATE 100 MG T: 100 | 24 days supply | Qty: 70 | Fill #3

## 2016-03-22 MED FILL — PRADAXA 150 MG CAPSULE: 150 | 30 days supply | Qty: 60 | Fill #0

## 2016-04-15 ENCOUNTER — Telehealth: Payer: Self-pay | Admitting: Family Medicine

## 2016-04-15 NOTE — Telephone Encounter (Signed)
Patient Name: [name] RifeRENA Dorsainvil Gender: Female DOB: May 15, 1950 Age: 66 Y 8 M 24 D Return Phone Number: [phone] (Primary) Address: City/State/Zip: [place] Client [facility] Client [facility] - Night Physician [name] Strickland - MD Contact Type Call Who Is Calling Patient / Member / Family / Caregiver Call Type Triage / Clinical Relationship To Patient Self Return Phone Number [phone] (Primary) Chief Complaint Headache Reason for Call Symptomatic / Request for Health Information Initial Comment Caller states they work at [employer] and think they have the flu. She has dry cough, headache, and fever. Fever started last night and is currently 102.3 PreDisposition Call Doctor Translation No Nurse Assessment Nurse: Laural BenesJohnson, RN, Judeth CornfieldStephanie Date/Time Lamount Cohen(Eastern Time): 04/13/2016 5:19:39 PM Confirm and document reason for call. If symptomatic, describe symptoms. ---Caller states she has dry cough, congestion, runny nose, body aches, temp 102.3 (oral). Caller took tylenol. Does the patient have any new or worsening symptoms? ---Yes Will a triage be completed? ---Yes Related visit to physician within the last 2 weeks? ---No Does the PT have any chronic conditions? (i.e. diabetes, asthma, etc.) ---Yes List chronic conditions. ---a fib Is this a behavioral health or substance abuse call? ---No Nurse: Laural BenesJohnson, RN, Judeth CornfieldStephanie Date/Time (Eastern Time): 04/13/2016 5:27:37 PM Please select the assessment type ---Verbal order / New medication order Does the client directives allow for assistance with medications after hours? ---Yes Other current medications? ---Unknown Medication allergies? ---Yes List medication allergies. ---mercurochrome, sulfa Pharmacy name and phone number. [facility] [phone] PLEASE NOTE: All timestamps contained within this report are represented as [place] Standard Time. CONFIDENTIALTY NOTICE: This fax  transmission is intended only for the addressee. It contains information that is legally privileged, confidential or otherwise protected from use or disclosure. If you are not the intended recipient, you are strictly prohibited from reviewing, disclosing, copying using or disseminating any of this information or taking any action in reliance on or regarding this information. If you have received this fax in error, please notify us immediately by telephone so that we can arrange for its return to us. Phone: [phone], Toll-Free: [phone], Fax: [phone] Page: 2 of 4 Call Id: [id number] Nurse Assessment Does the client directive allow for RN to call in the medication order to the pharmacy? ---Yes Guidelines Guideline Title Affirmed Question Affirmed Notes Nurse Date/Time Lamount Cohen(Eastern Time) Influenza - Seasonal [1] Fever > 101 F (38.3 C) AND [2] age > 6464 Laural BenesJohnson, RN, Judeth CornfieldStephanie 04/13/2016 5:22:52 PM Disp. Time Lamount Cohen(Eastern Time) Disposition Final User 04/13/2016 5:29:33 PM P[facility] Johnson, [place]RN, Stephanie 04/13/2016 5:37:46 PM Call Completed Ardelle BallsJohnson, RN, Stephanie 04/13/2016 5:27:00 PM See Physician within 4 Hours (or PCP triage) Yes Laural BenesJohnson, RN, Loura HaltStephanie Caller Understands: Yes Disagree/Comply: Disagree Disagree/Comply Reason: Unable to find transportation Care Advice Given Per Guideline SEE PHYSICIAN WITHIN 4 HOURS (or PCP triage): * You become worse. CARE ADVICE given per INFLUENZA - SEASONAL (Adult) guideline. CALL BACK IF: FEVER MEDICINE - ACETAMINOPHEN: * Fever above 101 F (38.3 C) should be treated with acetaminophen (e.g., Tylenol). This can be taken by mouth as pills or per rectum using a suppository. Both are available over the counter. Usual adult dose is 650 mg by mouth or per rectum every 6 hours. * IF OFFICE WILL BE CLOSED AND NO PCP TRIAGE: You need to be seen within the next 3 or 4 hours. A nearby Urgent Care Center is often a good source of care.  Another choice is  to go to the ER. Go sooner if you become worse. Verbal Orders/Maintenance Medications Medication Refill Route Dosage Regime Duration Admin Instructions User Name tamiflu 75mg  Oral 1PO BID 5 Days #10, no refills Laural Benes, RN, Judeth Cornfield Comments User: Kathlee Nations, RN Date/Time Lamount Cohen Time): 04/13/2016 5:21:52 PM caller also took zyrtec Referrals GO TO FACILITY REFUSED Paging DoctorName Phone DateTime Result/Outcome Message Type Notes Roxy Manns - [place] [phone] 04/13/2016 5:29:33 PM Paged On Call Back to Call Center Doctor Paged Please call Judeth Cornfield, RN at call center, [phone] Roxy Manns - MD 04/13/2016 5:33:30 PM Spoke with On Call - General Message Result Verbal order for Tamiflu 75mg  1PO BID 5 days #10 no refills

## 2016-04-25 MED FILL — PRADAXA 150 MG CAPSULE: 150 | 30 days supply | Qty: 60 | Fill #1

## 2016-04-25 MED FILL — FLECAINIDE ACETATE 100 MG T: 100 | 24 days supply | Qty: 70 | Fill #4

## 2016-04-25 MED FILL — CARTIA XT 120 MG CAPSULE SA: 120 | 30 days supply | Qty: 30 | Fill #3

## 2016-04-29 ENCOUNTER — Encounter: Payer: Self-pay | Admitting: Family Medicine

## 2016-04-29 ENCOUNTER — Ambulatory Visit (INDEPENDENT_AMBULATORY_CARE_PROVIDER_SITE_OTHER): Payer: 59 | Admitting: Family Medicine

## 2016-04-29 DIAGNOSIS — S39012A Strain of muscle, fascia and tendon of lower back, initial encounter: Secondary | ICD-10-CM | POA: Insufficient documentation

## 2016-04-29 LAB — POC URINALSYSI DIPSTICK (AUTOMATED)
Bilirubin, UA: NEGATIVE
GLUCOSE UA: NEGATIVE
Ketones, UA: NEGATIVE
LEUKOCYTES UA: NEGATIVE
NITRITE UA: NEGATIVE
PH UA: 6
Protein, UA: NEGATIVE
RBC UA: NEGATIVE
Spec Grav, UA: >=1.03
UROBILINOGEN UA: 0.2

## 2016-04-29 MED ORDER — CYCLOBENZAPRINE HCL 10 MG PO TABS: 5.0000 mg | ORAL_TABLET | Freq: Every evening | ORAL | 0 refills | Status: DC | PRN

## 2016-04-29 MED ORDER — TRAMADOL HCL 50 MG PO TABS: 50.0000 mg | ORAL_TABLET | Freq: Three times a day (TID) | ORAL | 0 refills | Status: DC | PRN

## 2016-04-29 MED FILL — traMADol HCL 50 MG TABS: 50 | 10 days supply | Qty: 20 | Fill #0

## 2016-04-29 MED FILL — CYCLOBENZAPRINE 10 MG TAB: 10 | 15 days supply | Qty: 15 | Fill #0

## 2016-04-29 NOTE — Addendum Note (Signed)
Addended by: Damita LackLORING, DONNA S on: 04/29/2016 09:44 AM   Modules accepted: Orders

## 2016-04-29 NOTE — Patient Instructions (Signed)
Start home PT, gentle stretching 3-4 times a day.  No heavy lifting greater than 10 lbs. No repetitive bending or twisting of back.  Tylenol for pain during the day, heat on low back area.  Tramadol for break through pain.  Muscle relaxant at night.  If not getting better in 2 week, call for formal PT.

## 2016-04-29 NOTE — Progress Notes (Signed)
Subjective:    Patient ID: [name] Strickland, female    DOB: 18-Jan-1951, 66 y.o.   MRN: [id number]  HPI 66 year old female pt of Dr. Elmer SowAron's with history of afib, HTN and morbid obesity presents with new onset pain in low back.   She chronically sleeps on cough.  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  She reports that  3 days ago she started having sharp pain, grabbing pain, Left, right occ bilateral when trying to get up from sitting or walking. Happens multiple times in day.  Sore of uncomfortable in low back constantly.  Bending over makes it hurt more.   No known injury.  No numbness, no tingling.  No radiation of pain.  No weakness in legs.   This AM after urinating she had a slight burn. No urgency, no frequency, no hematuria.  No fever.  No past history of back issues except occ sciatica, no past back surgeries.   She has been taking tylenol for pain. She tried 2 tramadol of husband's. Heat helped it.  Review of Systems  Constitutional: Negative for fatigue and fever.  HENT: Negative for ear pain.   Eyes: Negative for pain.  Respiratory: Negative for chest tightness and shortness of breath.   Cardiovascular: Negative for chest pain, palpitations and leg swelling.  Gastrointestinal: Negative for abdominal pain.  Genitourinary: Negative for dysuria.       Objective:   Physical Exam  Constitutional: Vital signs are normal. She appears well-developed and well-nourished. She is cooperative.  Non-toxic appearance. She does not appear ill. No distress.  Obese appearing female in NAD  HENT:  Head: Normocephalic.  Right Ear: Hearing, tympanic membrane, external ear and ear canal normal. Tympanic membrane is not erythematous, not retracted and not bulging.  Left Ear: Hearing, tympanic membrane, external ear and ear canal normal. Tympanic membrane is not erythematous, not retracted and not bulging.  Nose: No mucosal edema or rhinorrhea. Right sinus exhibits no  maxillary sinus tenderness and no frontal sinus tenderness. Left sinus exhibits no maxillary sinus tenderness and no frontal sinus tenderness.  Mouth/Throat: Uvula is midline, oropharynx is clear and moist and mucous membranes are normal.  Eyes: Conjunctivae, EOM and lids are normal. Pupils are equal, round, and reactive to light. Lids are everted and swept, no foreign bodies found.  Neck: Trachea normal and normal range of motion. Neck supple. Carotid bruit is not present. No thyroid mass and no thyromegaly present.  Cardiovascular: Normal rate, regular rhythm, S1 normal, S2 normal, normal heart sounds, intact distal pulses and normal pulses.  Exam reveals no gallop and no friction rub.   No murmur heard. Pulmonary/Chest: Effort normal and breath sounds normal. No tachypnea. No respiratory distress. She has no decreased breath sounds. She has no wheezes. She has no rhonchi. She has no rales.  Abdominal: Soft. Normal appearance and bowel sounds are normal. There is no tenderness.  Musculoskeletal:       Thoracic back: Normal.       Lumbar back: She exhibits decreased range of motion. She exhibits no tenderness and no bony tenderness.  Neg SLR, neg faber's, no CVA tenderness  Neurological: She is alert.  Skin: Skin is warm, dry and intact. No rash noted.  Psychiatric: Her speech is normal and behavior is normal. Judgment and thought content normal. Her mood appears not anxious. Cognition and memory are normal. She does not exhibit a depressed mood.  Assessment & Plan:

## 2016-04-29 NOTE — Assessment & Plan Note (Signed)
NSAIDs contraindicated. Tylenol for pain, start home PT, info given. Tramadol for breakthrough pain and muscle relaxant at night as needed.  If not improving consider imaging and PT referral.

## 2016-06-14 ENCOUNTER — Other Ambulatory Visit: Payer: Self-pay | Admitting: *Deleted

## 2016-06-14 MED ORDER — TRAMADOL HCL 50 MG PO TABS: 50.0000 mg | ORAL_TABLET | Freq: Three times a day (TID) | ORAL | 0 refills | Status: DC | PRN

## 2016-06-14 MED ORDER — CYCLOBENZAPRINE HCL 10 MG PO TABS: 5.0000 mg | ORAL_TABLET | Freq: Every evening | ORAL | 0 refills | Status: DC | PRN

## 2016-06-14 MED FILL — traMADol HCL 50 MG TABS: 50 | 10 days supply | Qty: 30 | Fill #0

## 2016-06-14 MED FILL — CYCLOBENZAPRINE 10 MG TAB: 10 | 30 days supply | Qty: 30 | Fill #0

## 2016-06-14 NOTE — Telephone Encounter (Signed)
Please call in tramadol.  Thanks.  f/u with PCP as needed.   Routed to PCP.

## 2016-06-14 NOTE — Telephone Encounter (Signed)
[name] Strickland called in sever pain last appt Dr. Ermalene SearingBedsole prescribed meds for back pain/spasms. Artelia LarocheRena said she had to do a lot of lifting her husband and moving his hospital bed yesterday and woke up in sever back pain/spasms. She is requesting a refill of her flexeril and tramadol that Dr. Ermalene SearingBedsole prescribed last time for this issues. Pt said the pain is so sever it triggered her afib so she is at home today. Pt is requesting Rx to be changed  #30 if Dr. Para Marchuncan is okay with that due to her co-pay, [name] request call back if Dr. Para Marchuncan is okay with refilling meds. refill request send to Dr. Para Marchuncan per [name] request since Dr. Ermalene SearingBedsole and Dr. Dayton MartesAron are both off today

## 2016-06-14 NOTE — Telephone Encounter (Signed)
Verbal refill given to Heidi at the pharmacy. Advised pt.

## 2016-08-16 ENCOUNTER — Other Ambulatory Visit: Payer: Self-pay | Admitting: *Deleted

## 2016-08-16 MED ORDER — CYCLOBENZAPRINE HCL 10 MG PO TABS: 5.0000 mg | ORAL_TABLET | Freq: Every evening | ORAL | 0 refills | Status: DC | PRN

## 2016-08-16 MED ORDER — TRAMADOL HCL 50 MG PO TABS: 50.0000 mg | ORAL_TABLET | Freq: Three times a day (TID) | ORAL | 0 refills | Status: DC | PRN

## 2016-08-16 MED FILL — CYCLOBENZAPRINE 10 MG TAB: 10 | 30 days supply | Qty: 30 | Fill #0

## 2016-08-16 MED FILL — traMADol HCL 50 MG TABS: 50 | 10 days supply | Qty: 30 | Fill #0

## 2016-08-16 NOTE — Telephone Encounter (Signed)
Tramadol called into [facility] - [place], [place] - [address] .Phone: [phone] 

## 2016-08-16 NOTE — Telephone Encounter (Signed)
[name] Strickland would like refill on the flexeril and tramadol for her back.  She has had to do a lot of pulling on Tom lately and her back is bothering her again.  She states she can't afford to do PT nor does she have the PAL to get off work to go to PT appointments.  She will continue to work on home stretching but would like a refill because it does help the back pain. Ok to refill?

## 2016-10-09 ENCOUNTER — Other Ambulatory Visit: Payer: Self-pay | Admitting: Internal Medicine

## 2016-10-09 MED FILL — PRADAXA 150 MG CAP: 150 | 30 days supply | Qty: 60 | Fill #2

## 2016-10-09 MED FILL — FLECAINIDE ACETATE 100 MG T: 100 | 24 days supply | Qty: 70 | Fill #5

## 2016-10-10 MED FILL — CARTIA XT 120 MG CAPSULE SA: 120 | 30 days supply | Qty: 30 | Fill #0

## 2016-11-19 ENCOUNTER — Telehealth: Payer: Self-pay | Admitting: Internal Medicine

## 2016-11-19 NOTE — Telephone Encounter (Signed)
Spoke with patient she is aware FMLA completed and faxed to [employer]. Per her request it has been mailed to her home address.

## 2016-11-27 ENCOUNTER — Other Ambulatory Visit: Payer: Self-pay | Admitting: Internal Medicine

## 2016-11-27 MED FILL — CARTIA XT 120 MG CAPSULE SA: 120 | 30 days supply | Qty: 30 | Fill #1

## 2016-11-27 MED FILL — FLECAINIDE ACETATE 100 MG T: 100 | 23 days supply | Qty: 70 | Fill #0

## 2016-11-27 MED FILL — PRADAXA 150 MG CAP: 150 | 30 days supply | Qty: 60 | Fill #3

## 2017-02-25 ENCOUNTER — Other Ambulatory Visit: Payer: Self-pay | Admitting: Internal Medicine

## 2017-02-25 MED FILL — PRADAXA 150 MG CAP: 150 | 30 days supply | Qty: 60 | Fill #0

## 2017-02-25 MED FILL — CARTIA XT 120 MG CAPSULE SA: 120 | 30 days supply | Qty: 30 | Fill #0

## 2017-02-25 MED FILL — FLECAINIDE ACETATE 100 MG T: 100 | 45 days supply | Qty: 90 | Fill #0

## 2017-02-26 ENCOUNTER — Encounter: Payer: Self-pay | Admitting: Internal Medicine

## 2017-04-16 ENCOUNTER — Ambulatory Visit: Payer: 59 | Admitting: Internal Medicine

## 2017-04-21 ENCOUNTER — Encounter: Payer: Self-pay | Admitting: *Deleted

## 2017-05-06 ENCOUNTER — Encounter: Payer: Self-pay | Admitting: Internal Medicine

## 2017-05-06 ENCOUNTER — Ambulatory Visit (INDEPENDENT_AMBULATORY_CARE_PROVIDER_SITE_OTHER): Payer: 59 | Admitting: Internal Medicine

## 2017-05-06 VITALS — BP 152/80 | HR 65 | Ht 69.0 in | Wt 265.4 lb

## 2017-05-06 DIAGNOSIS — I480 Paroxysmal atrial fibrillation: Secondary | ICD-10-CM

## 2017-05-06 MED ORDER — PANTOPRAZOLE SODIUM 40 MG PO TBEC: 40.0000 mg | DELAYED_RELEASE_TABLET | Freq: Every day | ORAL | 0 refills | Status: DC

## 2017-05-06 MED ORDER — PRADAXA 150 MG PO CAPS: 150.0000 mg | ORAL_CAPSULE | Freq: Two times a day (BID) | ORAL | 11 refills | Status: DC

## 2017-05-06 MED ORDER — FLECAINIDE ACETATE 100 MG PO TABS: ORAL_TABLET | ORAL | 0 refills | Status: DC

## 2017-05-06 MED ORDER — DILTIAZEM HCL ER COATED BEADS 120 MG PO CP24: 120.0000 mg | ORAL_CAPSULE | Freq: Every day | ORAL | 3 refills | Status: DC

## 2017-05-06 MED ORDER — FLECAINIDE ACETATE 100 MG PO TABS: 100.0000 mg | ORAL_TABLET | Freq: Two times a day (BID) | ORAL | 3 refills | Status: DC

## 2017-05-06 NOTE — Patient Instructions (Addendum)
Medication Instructions:  Your physician has recommended you make the following change in your medication:  1.  Take your flecainide as directed.  If after taking your flecainide as prescribe you have breakthrough atrial fibrillation increase your flecainide to 1 tab in the AM and 1.5 tabs at night.   2.  If you start taking the additional 1/2 tab consistently please call me so we can have you come in for an EKG.  Labwork: None ordered.  Testing/Procedures: None ordered.  Follow-Up: Your physician wants you to follow-up in: 6 months with Dr. Ladona Ridgelaylor.   You will receive a reminder letter in the mail two months in advance. If you don't receive a letter, please call our office to schedule the follow-up appointment.  Any Other Special Instructions Will Be Listed Below (If Applicable).  If you need a refill on your cardiac medications before your next appointment, please call your pharmacy.

## 2017-05-06 NOTE — Progress Notes (Signed)
HPI [name] Strickland returns today for ongoing evaluation and management of atrial fib with a CVR. SHe is a morbidly obese 67 year old woman who has a long h/o atrial fib. SHe has been treated with medical therapy with flecainide. She notes break through episodes of atrial fib but also admits to non-compliance, especially at night time. Her episodes of atrial fib will awaken her at night and last for several hours. No syncope. She gets more dyspneic with her atrial fib.  Allergies  Allergen Reactions  . Shellfish-Derived Products Itching and Swelling       . Iodine Other (See Comments)    Unknown childhood reaction to topical iodine  . Contrast Media [Iodinated Diagnostic Agents] Other (See Comments)    Unknown reaction to topical iodine as a child  . Sulfonamide Derivatives Itching and Rash     Current Outpatient Medications  Medication Sig Dispense Refill  . acetaminophen (TYLENOL) 650 MG CR tablet Take 650 mg by mouth at bedtime as needed for pain.    Marland Kitchen. ALPRAZolam (XANAX) 0.5 MG tablet as directed. TAKE 1 TABLET BY MOUTH EVERY NIGHT AT BEDTIME AS NEEDED    . cyclobenzaprine (FLEXERIL) 10 MG tablet Take 0.5-1 tablets (5-10 mg total) by mouth at bedtime as needed for muscle spasms. 30 tablet 0  . diltiazem (CARTIA XT) 120 MG 24 hr capsule Take 1 capsule (120 mg total) by mouth daily. 90 capsule 3  . pantoprazole (PROTONIX) 40 MG tablet Take 1 tablet (40 mg total) by mouth daily. 90 tablet 0  . [name] 150 MG CAPS capsule Take 1 capsule (150 mg total) by mouth 2 (two) times daily. 60 capsule 11  . traMADol (ULTRAM) 50 MG tablet Take 50 mg by mouth every 8 (eight) hours as needed for moderate pain.    . flecainide (TAMBOCOR) 100 MG tablet Take additional 1/2 tab at night for breakthrough atrial fibrillation as needed 30 tablet 0  . flecainide (TAMBOCOR) 100 MG tablet Take 1 tablet (100 mg total) by mouth 2 (two) times daily. 180 tablet 3   No current facility-administered medications  for this visit.      Past Medical History:  Diagnosis Date  . Diabetes mellitus    pt is unaware and is on no medicine for this (A1C 5.9 2012)  . Dyslipidemia   . Hypertension   . Overweight   . Paroxysmal atrial fibrillation (HCC)    chads2vasc score of at least 2  . Snoring    cancelled prior sleep study due to costs  . Typical atrial flutter (HCC)     ROS:   All systems reviewed and negative except as noted in the HPI.   Past Surgical History:  Procedure Laterality Date  . APPENDECTOMY    . ATRIAL FIBRILLATION ABLATION N/A 07/26/2014   Procedure: ATRIAL FIBRILLATION ABLATION;  Surgeon: Hillis RangeJames Allred, MD;  [facility];  Service: Cardiovascular;  Laterality: N/A;  . BREAST LUMPECTOMY     benign  . CARDIOVERSION N/A 07/03/2014   Procedure: CARDIOVERSION;  Surgeon: Luis AbedJeffrey D Katz, MD;  [facility];  Service: Cardiovascular;  Laterality: N/A;  . KNEE SURGERY    . REPLACEMENT TOTAL KNEE     bilateral, different times  . TEE WITHOUT CARDIOVERSION N/A 07/26/2014   Procedure: TRANSESOPHAGEAL ECHOCARDIOGRAM (TEE);  Surgeon: Lewayne BuntingBrian S Crenshaw, MD;  [facility];  Service: Cardiovascular;  Laterality: N/A;  . TONSILLECTOMY AND ADENOIDECTOMY       Family History  Problem  Relation Age of Onset  . Heart attack Mother   . Heart disease Father        cabg x 7  . Heart attack Father   . Stroke Father      Social History   Socioeconomic History  . Marital status: Married    Spouse name: Not on file  . Number of children: Not on file  . Years of education: Not on file  . Highest education level: Not on file  Social Needs  . Financial resource strain: Not on file  . Food insecurity - worry: Not on file  . Food insecurity - inability: Not on file  . Transportation needs - medical: Not on file  . Transportation needs - non-medical: Not on file  Occupational History  . Not on file  Tobacco Use  . Smoking status: Never Smoker  . Smokeless tobacco:  Never Used  Substance and Sexual Activity  . Alcohol use: No  . Drug use: No  . Sexual activity: Not on file  Other Topics Concern  . Not on file  Social History Narrative   Pt lives in [place] with husband daughter.  Her husband is chronically ill with CHF and morbid obesity (420 lbs). She provides total care for him.   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     BP (!) 152/80   Pulse 65   Ht 5\' 9"  (1.753 m)   Wt 265 lb 6.4 oz (120.4 kg)   BMI 39.19 kg/m   Physical Exam:  obese appearing 67 yo woman, NAD HEENT: Unremarkable Neck:  6 cm JVD, no thyromegally Lymphatics:  No adenopathy Back:  No CVA tenderness Lungs:  Clear with no wheezes HEART:  Regular rate rhythm, no murmurs, no rubs, no clicks Abd:  soft, positive bowel sounds, no organomegally, no rebound, no guarding Ext:  2 plus pulses, no edema, no cyanosis, no clubbing Skin:  No rashes no nodules Neuro:  CN II through XII intact, motor grossly intact  EKG - NSR    Assess/Plan: 1. PAF - we have discussed the treatment options with the patient in detail. I suspect she is more non-compliant than she admits. After discussing the possble options for treatment she will try to improve her compliance. If doing so does not improve her symptoms then we will uptitrate to 100 mg in the a.m. And 150 mg in the evening.  2. Obesity - she understands that her obesity increases her likelihood of reverting back to Afib.  3. HTN - her blood pressure is elevated. He is encouraged to reduce his salt intake.

## 2017-05-22 ENCOUNTER — Encounter: Payer: Self-pay | Admitting: Family Medicine

## 2017-05-22 ENCOUNTER — Ambulatory Visit: Payer: 59 | Admitting: Family Medicine

## 2017-05-22 VITALS — BP 134/80 | HR 54 | Temp 97.8°F | Wt 261.0 lb

## 2017-05-22 DIAGNOSIS — R0789 Other chest pain: Secondary | ICD-10-CM

## 2017-05-22 LAB — TROPONIN I: TNIDX: 0 ug/L (ref 0.00–0.06)

## 2017-05-22 NOTE — Patient Instructions (Signed)
Don't change your meds for now.  I'll await the lab result.   Take care.  Update me as needed.

## 2017-05-22 NOTE — Progress Notes (Signed)
D/w pt about options.    She has had L sided chest pain and L arm pain.  Started last night about 11:30 PM.  She went to bed.  Got up at 2AM to help her husband and felt it then.  Not a severe pain, 3-4/10, "nagging" pain.  She helped him and then went back to bed.  She still has sx when she got up a ~6AM.  It didn't get worse at that point.    Worse sx ~730 this AM on the way to work, when using her L arm to steer/drive.  She came into work, was rushing to get in and the pain got a little worse.    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 "It came over me" meaning she got tearful and then the pain got better.    No recent extra doses of prn flecainide.  No use of BZD recently.  She has been caring for her husband on hospice and that has been an ongoing chronic strain for the patient.    She isn't SOB.  She has some BLE edema at baseline, not changed.  She doesn't feel her heart racing.  No recent flares of Afib/tachycardia.  No NTG use.  Not lightheaded.    No FCNAVD.  No new neuro sx.  She feels better now.    PMH and SH reviewed  ROS: Per HPI unless specifically indicated in ROS section   Meds, vitals, and allergies reviewed.   GEN: nad, alert and oriented HEENT: mucous membranes moist NECK: supple w/o LA CV: mild brady noted on exam but rrr o/w.   PULM: ctab, no inc wob ABD: soft, +bs EXT: trace BLE edema L chest wall not ttp but L proximal biceps is ttp.  No pain on L shoulder ROM

## 2017-05-25 DIAGNOSIS — R0789 Other chest pain: Secondary | ICD-10-CM | POA: Insufficient documentation

## 2017-05-25 NOTE — Assessment & Plan Note (Signed)
No change in meds at the time of the office visit.  EKG without acute changes.  We talked about options, including checking a troponin.  Troponin negative.  I informed patient.  This was later in the morning when I got the results back.  The patient was feeling better by that time of day.  She did not have ongoing symptoms.  I talked to her again the next day at work and she was feeling well without symptoms.  No reason to suspect a cardiac cause at this point.  No reason to suspect an ominous diagnosis.  She can update me as needed.

## 2017-06-19 MED FILL — CARTIA XT 120 MG CAPSULE SA: 120 | 30 days supply | Qty: 30 | Fill #0

## 2017-06-19 MED FILL — PANTOPRAZOLE SOD DR 40 MG T: 40 | 90 days supply | Qty: 90 | Fill #0

## 2017-06-19 MED FILL — FLECAINIDE ACETATE 100 MG T: 100 | 90 days supply | Qty: 180 | Fill #0

## 2017-06-19 MED FILL — PRADAXA 150 MG CAP: 150 | 30 days supply | Qty: 60 | Fill #1

## 2017-07-23 MED FILL — DILTIAZEM 24HR ER 120 MG CA: 120 | 30 days supply | Qty: 30 | Fill #1

## 2017-07-23 MED FILL — PRADAXA 150 MG CAP: 150 | 30 days supply | Qty: 60 | Fill #0

## 2017-08-07 ENCOUNTER — Other Ambulatory Visit: Payer: Self-pay | Admitting: Family Medicine

## 2017-08-07 ENCOUNTER — Other Ambulatory Visit (INDEPENDENT_AMBULATORY_CARE_PROVIDER_SITE_OTHER): Payer: 59

## 2017-08-07 DIAGNOSIS — Z8619 Personal history of other infectious and parasitic diseases: Secondary | ICD-10-CM

## 2017-08-07 DIAGNOSIS — Z9229 Personal history of other drug therapy: Secondary | ICD-10-CM

## 2017-08-07 NOTE — Progress Notes (Signed)
Patient needed titer labs for work.  Ordered.

## 2017-08-07 NOTE — Addendum Note (Signed)
Addended by: Wendi Maya on: 08/07/2017 04:04 PM   Modules accepted: Orders

## 2017-09-04 MED FILL — DILTIAZEM 24HR ER 120 MG CA: 120 | 30 days supply | Qty: 30 | Fill #2

## 2017-09-04 MED FILL — PRADAXA 150 MG CAP: 150 | 30 days supply | Qty: 60 | Fill #1

## 2017-09-04 MED FILL — FLECAINIDE ACETATE 100 MG T: 100 | 90 days supply | Qty: 180 | Fill #1

## 2017-10-17 MED FILL — CARTIA XT 120 MG CAPSULE SA: 120 | 30 days supply | Qty: 30 | Fill #3

## 2017-10-17 MED FILL — PRADAXA 150 MG CAP: 150 | 30 days supply | Qty: 60 | Fill #2

## 2017-10-28 ENCOUNTER — Telehealth: Payer: Self-pay | Admitting: Internal Medicine

## 2017-10-28 NOTE — Telephone Encounter (Signed)
Patient called in asking if FMLA could be extended through 03/03/2018. I have printed off FMLA and will take to Jenny/Dr.Taylor to see if it can be extended.

## 2017-10-29 ENCOUNTER — Telehealth: Payer: Self-pay | Admitting: Internal Medicine

## 2017-10-29 NOTE — Telephone Encounter (Signed)
Dr.Taylor corrected date on FMLA forms per patients request. I have contacted patient made her aware and she asked if it could be faxed to [employer]. Forms faxed to [phone].

## 2017-11-03 ENCOUNTER — Other Ambulatory Visit: Payer: Self-pay | Admitting: *Deleted

## 2017-11-03 MED ORDER — CYCLOBENZAPRINE HCL 10 MG PO TABS: 5.0000 mg | ORAL_TABLET | Freq: Every evening | ORAL | 0 refills | Status: DC | PRN

## 2017-11-03 NOTE — Telephone Encounter (Signed)
Pt has pain in right side of neck that radiates to the front of her neck to her neck/chest pt has discussed this with Dr. Milinda Antisower and is requesting refill of flexeril since PCP isn't here today, [facility]

## 2017-11-11 ENCOUNTER — Telehealth: Payer: Self-pay | Admitting: Internal Medicine

## 2017-11-11 NOTE — Telephone Encounter (Signed)
Pt called in yesterday stated Date Dr.Taylor corrected FMLA was not dated (10/29/17) [place] she added date I have faxed back over to [employer] for patient per our conversation yesterday.

## 2017-11-12 ENCOUNTER — Encounter: Payer: Self-pay | Admitting: Internal Medicine

## 2017-11-12 ENCOUNTER — Ambulatory Visit (INDEPENDENT_AMBULATORY_CARE_PROVIDER_SITE_OTHER): Payer: 59 | Admitting: Internal Medicine

## 2017-11-12 VITALS — BP 130/84 | HR 90 | Ht 69.0 in | Wt 262.0 lb

## 2017-11-12 DIAGNOSIS — I480 Paroxysmal atrial fibrillation: Secondary | ICD-10-CM

## 2017-11-12 DIAGNOSIS — I10 Essential (primary) hypertension: Secondary | ICD-10-CM | POA: Diagnosis not present

## 2017-11-12 NOTE — Progress Notes (Signed)
HPI Mrs. Ernest Mallicksley returns today for ongoing evaluation and management of atrial fib with a CVR. She is a morbidly obese middle aged woman who has a long h/o atrial fib. SHe has been treated with medical therapy with flecainide. She has had an ablation remotely. She notes break through episodes of atrial fib. No syncope. She gets more dyspneic with her atrial fib.  Allergies  Allergen Reactions  . Shellfish-Derived Products Itching and Swelling       . Iodine Other (See Comments)    Unknown childhood reaction to topical iodine  . Contrast Media [Iodinated Diagnostic Agents] Other (See Comments)    Unknown reaction to topical iodine as a child  . Sulfonamide Derivatives Itching and Rash     Current Outpatient Medications  Medication Sig Dispense Refill  . acetaminophen (TYLENOL) 650 MG CR tablet Take 650 mg by mouth at bedtime as needed for pain.    Marland Kitchen. ALPRAZolam (XANAX) 0.5 MG tablet as directed. TAKE 1 TABLET BY MOUTH EVERY NIGHT AT BEDTIME AS NEEDED    . cyclobenzaprine (FLEXERIL) 10 MG tablet Take 0.5-1 tablets (5-10 mg total) by mouth at bedtime as needed for muscle spasms. 30 tablet 0  . diltiazem (CARTIA XT) 120 MG 24 hr capsule Take 1 capsule (120 mg total) by mouth daily. 90 capsule 3  . flecainide (TAMBOCOR) 100 MG tablet Take additional 1/2 tab at night for breakthrough atrial fibrillation as needed 30 tablet 0  . flecainide (TAMBOCOR) 100 MG tablet Take 1 tablet (100 mg total) by mouth 2 (two) times daily. 180 tablet 3  . pantoprazole (PROTONIX) 40 MG tablet Take 1 tablet (40 mg total) by mouth daily. 90 tablet 0  . [name] 150 MG CAPS capsule Take 1 capsule (150 mg total) by mouth 2 (two) times daily. 60 capsule 11  . traMADol (ULTRAM) 50 MG tablet Take 50 mg by mouth every 8 (eight) hours as needed for moderate pain.     No current facility-administered medications for this visit.      Past Medical History:  Diagnosis Date  . Diabetes mellitus    pt is unaware and  is on no medicine for this (A1C 5.9 2012)  . Dyslipidemia   . Hypertension   . Overweight   . Paroxysmal atrial fibrillation (HCC)    chads2vasc score of at least 2  . Snoring    cancelled prior sleep study due to costs  . Typical atrial flutter (HCC)     ROS:   All systems reviewed and negative except as noted in the HPI.   Past Surgical History:  Procedure Laterality Date  . APPENDECTOMY    . ATRIAL FIBRILLATION ABLATION N/A 07/26/2014   Procedure: ATRIAL FIBRILLATION ABLATION;  Surgeon: Hillis RangeJames Allred, MD;  [facility];  Service: Cardiovascular;  Laterality: N/A;  . BREAST LUMPECTOMY     benign  . CARDIOVERSION N/A 07/03/2014   Procedure: CARDIOVERSION;  Surgeon: Luis AbedJeffrey D Katz, MD;  [facility];  Service: Cardiovascular;  Laterality: N/A;  . KNEE SURGERY    . REPLACEMENT TOTAL KNEE     bilateral, different times  . TEE WITHOUT CARDIOVERSION N/A 07/26/2014   Procedure: TRANSESOPHAGEAL ECHOCARDIOGRAM (TEE);  Surgeon: Lewayne BuntingBrian S Crenshaw, MD;  [facility];  Service: Cardiovascular;  Laterality: N/A;  . TONSILLECTOMY AND ADENOIDECTOMY       Family History  Problem Relation Age of Onset  . Heart attack Mother   . Heart disease Father  cabg x 7  . Heart attack Father   . Stroke Father      Social History   Socioeconomic History  . Marital status: Married    Spouse name: Not on file  . Number of children: Not on file  . Years of education: Not on file  . Highest education level: Not on file  Occupational History  . Not on file  Social Needs  . Financial resource strain: Not on file  . Food insecurity:    Worry: Not on file    Inability: Not on file  . Transportation needs:    Medical: Not on file    Non-medical: Not on file  Tobacco Use  . Smoking status: Never Smoker  . Smokeless tobacco: Never Used  Substance and Sexual Activity  . Alcohol use: No  . Drug use: No  . Sexual activity: Not on file  Lifestyle  . Physical activity:     Days per week: Not on file    Minutes per session: Not on file  . Stress: Not on file  Relationships  . Social connections:    Talks on phone: Not on file    Gets together: Not on file    Attends religious service: Not on file    Active member of club or organization: Not on file    Attends meetings of clubs or organizations: Not on file    Relationship status: Not on file  . Intimate partner violence:    Fear of current or ex partner: Not on file    Emotionally abused: Not on file    Physically abused: Not on file    Forced sexual activity: Not on file  Other Topics Concern  . Not on file  Social History Narrative   [place] lives in [place] daughter.  Her husband is chronically ill with CHF and morbid obesity (420 lbs). She provides total care for him.   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     BP 130/84   Pulse 90   Ht 5\' 9"  (1.753 m)   Wt 262 lb (118.8 kg)   SpO2 96%   BMI 38.69 kg/m   Physical Exam:  Well appearing NAD HEENT: Unremarkable Neck:  No JVD, no thyromegally Lymphatics:  No adenopathy Back:  No CVA tenderness Lungs:  Clear with no wheezes HEART:  Regular rate rhythm, no murmurs, no rubs, no clicks Abd:  soft, positive bowel sounds, no organomegally, no rebound, no guarding Ext:  2 plus pulses, no edema, no cyanosis, no clubbing Skin:  No rashes no nodules Neuro:  CN II through XII intact, motor grossly intact  EKG - nsr   Assess/Plan: 1. PAF - she appears to be well controlled. She will continue bid flecainide 2. Obesity - I shared the data on the importance of weight loss for control of atrial fib. 3. HTN - she is encouraged to eat less salt and lose weight.  Leonia ReevesGregg Taylor,M.D.

## 2017-11-12 NOTE — Patient Instructions (Signed)

## 2017-11-13 ENCOUNTER — Telehealth: Payer: Self-pay | Admitting: Internal Medicine

## 2017-11-13 NOTE — Telephone Encounter (Signed)
Pt was here yesterday for an office visit with Dr.Taylor. I was called down to the room to speak with patient. She stated her FMLA need Dr.Taylor's full signature. I made her aware once he does this I will fax back to [employer] for her. FMLA was on my desk this am signed and I have faxed.

## 2017-11-26 MED FILL — CARTIA XT 120 MG CAPSULE SA: 120 | 30 days supply | Qty: 30 | Fill #4

## 2018-01-15 MED FILL — CARTIA XT 120 MG CP24: 120 | 30 days supply | Qty: 30 | Fill #5

## 2018-01-15 MED FILL — FLECAINIDE ACETATE 100 MG T: 100 | 90 days supply | Qty: 180 | Fill #2

## 2018-01-15 MED FILL — PRADAXA 150 MG CAP: 150 | 30 days supply | Qty: 60 | Fill #3

## 2018-02-11 MED FILL — PRADAXA 150 MG CAP: 150 | 30 days supply | Qty: 60 | Fill #4

## 2018-02-11 MED FILL — CARTIA XT 120 MG CAPSULE: 120 | 30 days supply | Qty: 30 | Fill #6

## 2018-04-27 MED FILL — FLECAINIDE ACETATE 100 MG T: 100 | 90 days supply | Qty: 180 | Fill #3

## 2018-04-27 MED FILL — PRADAXA 150 MG CAP: 150 | 30 days supply | Qty: 60 | Fill #5

## 2018-04-27 MED FILL — CARTIA XT 120 MG CAPSULE: 120 | 30 days supply | Qty: 30 | Fill #7

## 2018-06-15 ENCOUNTER — Other Ambulatory Visit: Payer: Self-pay | Admitting: Internal Medicine

## 2018-06-15 DIAGNOSIS — I480 Paroxysmal atrial fibrillation: Secondary | ICD-10-CM

## 2018-06-15 DIAGNOSIS — Z5181 Encounter for therapeutic drug level monitoring: Secondary | ICD-10-CM

## 2018-06-15 NOTE — Telephone Encounter (Signed)
Pt last saw Dr Ladona Ridgel 11/12/17, all meds were refilled at that time by covering RN.  Pt's last labs in the chart are from 10/13/15 Creat 0.67, no more recent labs in care everywhere or [facility].  Age 68, weight 118.8kg, we need recent labwork on pt to calculate a more accurate up to date CrCl to refill rx.  Called spoke with pt advised she is overdue for foloow-up, given COVID-19 will have pt come in in 1-2 months to have CBC and CMP drawn to calculate [name] dosage.

## 2018-06-16 MED FILL — PRADAXA 150 MG CAP: 150 | 30 days supply | Qty: 60 | Fill #0

## 2018-06-23 ENCOUNTER — Other Ambulatory Visit: Payer: Self-pay | Admitting: Internal Medicine

## 2018-06-24 ENCOUNTER — Other Ambulatory Visit: Payer: Self-pay | Admitting: *Deleted

## 2018-06-24 MED ORDER — FLECAINIDE ACETATE 100 MG PO TABS: ORAL_TABLET | ORAL | 1 refills | Status: DC

## 2018-06-24 MED ORDER — FLECAINIDE ACETATE 100 MG PO TABS: 100.0000 mg | ORAL_TABLET | Freq: Two times a day (BID) | ORAL | 1 refills | Status: DC

## 2018-06-24 MED FILL — FLECAINIDE ACETATE 100 MG T: 100 | 90 days supply | Qty: 180 | Fill #0

## 2018-06-29 MED FILL — DILTIAZEM 24HR CD 120 MG CA: 120 | 90 days supply | Qty: 90 | Fill #0

## 2018-08-31 DIAGNOSIS — K0263 Dental caries on smooth surface penetrating into pulp: Secondary | ICD-10-CM | POA: Diagnosis not present

## 2018-11-06 MED FILL — CARTIA XT 120 MG CP24: 120 | 90 days supply | Qty: 90 | Fill #0

## 2018-11-09 ENCOUNTER — Telehealth: Payer: Self-pay

## 2018-11-09 NOTE — Telephone Encounter (Signed)
**Note De-Identified Kirstyn Lean Obfuscation** I have started a [name] PA through covermymeds. Key: [id number]

## 2018-11-11 NOTE — Telephone Encounter (Signed)
Pt called to inquire about the prior auth for her [name]. Pt would like a call concerning this matter. Please address

## 2018-11-11 NOTE — Telephone Encounter (Signed)
The pt is advised that I started her [name] PA on 8/10 through covermymeds and that I have not heard back from them.  The pt wants to know the progress of her [name] PA as she has other RXs to pick up and wants to wait until [name] is approved so she will not have to make 2 trips to her pharmacy.  I called [employer] at [phone] and s/w Shanon Brow. Per Shanon Brow they received the PA on 8/10 and that it takes up to 3 business days for a determination unless they need more information and then it takes longer.   As requested by the pt I called her back at [phone] and left a VM advising her that we should have a determination by late Thursday or early Friday unless they require more information.

## 2018-11-12 NOTE — Telephone Encounter (Signed)
**Note De-Identified [name] Obfuscation** We received a form [name] Strickland fax from [name] concerning the pts [name] PA. The form only has 1 question which is "has the pt had a trial of Eliquis or Xarelto?" and gives me the option to call and answer the question or complete the form and fax it back.  I called [phone] and s/w [name] . I advised [name] that the pt has never had a trial of Eliquis or Xarelto.  I requested so [name] added that the pt has been taking [name] since 01/16/2011 with good results and that Dr Lovena Le does not want to interrupt her therapy at this point. Also, they have been covering the pts [name] for nearly 8 years now we are requesting that they continue.  Per [name] she is sending to the PA Dept and that it can take up to 72 hours for their determination.

## 2018-11-16 NOTE — Telephone Encounter (Addendum)
**Note De-Identified Zarin Knupp Obfuscation** The pt is advise that I was advised on Thursday 8/13 that it can take up to 72 more business hours for their ([employer]) reply because they had a question which started their process over again. I advised Lecretia that I will call her as soon as I hear back from them.  She thanked me for the update.

## 2018-11-16 NOTE — Telephone Encounter (Signed)
  Patient is following up regarding her [name] auth

## 2018-11-18 MED FILL — FLECAINIDE ACETATE 100 MG T: 100 | 90 days supply | Qty: 180 | Fill #0

## 2018-11-18 NOTE — Telephone Encounter (Signed)
**Note De-Identified Antha Niday Obfuscation** [name] Strickland is advised that we received a letter from [place] stating that the pts [name] PA has been approved. [name] Strickland ran [employer], verified that it no longer needs a PA and states that they will fill now.  I called the pt on her cell phone and home phone but got no answer and no way to leave a message. I called her work number (She requested that I leave a message on her work VM if I cannot reach her at other 2 numbers) and left a message that her RX was approved and to call me if she has any questions.

## 2018-11-18 NOTE — Telephone Encounter (Signed)
Follow Up   [name] Strickland from [place] is following up about either filling the [name] or the Xarelto. Please give a call back to inform of what needs to be done.

## 2018-11-19 MED FILL — PRADAXA 150 MG CAP: 150 | 30 days supply | Qty: 60 | Fill #1

## 2018-11-19 MED FILL — CARTIA XT 120 MG CP24: 120 | 90 days supply | Qty: 90 | Fill #0

## 2019-01-29 ENCOUNTER — Ambulatory Visit: Payer: 59 | Admitting: Internal Medicine

## 2019-01-29 ENCOUNTER — Other Ambulatory Visit: Payer: Self-pay

## 2019-01-29 ENCOUNTER — Encounter: Payer: Self-pay | Admitting: Internal Medicine

## 2019-01-29 VITALS — BP 136/74 | HR 63 | Ht 69.0 in | Wt 258.4 lb

## 2019-01-29 DIAGNOSIS — I10 Essential (primary) hypertension: Secondary | ICD-10-CM | POA: Diagnosis not present

## 2019-01-29 DIAGNOSIS — I4819 Other persistent atrial fibrillation: Secondary | ICD-10-CM

## 2019-01-29 NOTE — Patient Instructions (Signed)

## 2019-01-29 NOTE — Progress Notes (Signed)
HPI [name] Strickland returns today for followup. She is a pleasant morbidly obese woman with PAF and chest pain. She thinks that she is having an episode or two of symptomatic atrial fib a month and to treat this she takes an extra flecainide. She denies exertional chest pain. No sob. She lost her husband earlier in the year. Allergies  Allergen Reactions  . Shellfish-Derived Products Itching and Swelling       . Iodine Other (See Comments)    Unknown childhood reaction to topical iodine  . Contrast Media [Iodinated Diagnostic Agents] Other (See Comments)    Unknown reaction to topical iodine as a child  . Sulfonamide Derivatives Itching and Rash     Current Outpatient Medications  Medication Sig Dispense Refill  . acetaminophen (TYLENOL) 650 MG CR tablet Take 650 mg by mouth at bedtime as needed for pain.    Marland Kitchen ALPRAZolam (XANAX) 0.5 MG tablet as directed. TAKE 1 TABLET BY MOUTH EVERY NIGHT AT BEDTIME AS NEEDED    . CARTIA XT 120 MG 24 hr capsule TAKE 1 CAPSULE BY MOUTH DAILY. 90 capsule 1  . cyclobenzaprine (FLEXERIL) 10 MG tablet Take 0.5-1 tablets (5-10 mg total) by mouth at bedtime as needed for muscle spasms. 30 tablet 0  . flecainide (TAMBOCOR) 100 MG tablet Take additional 1/2 tab at night for breakthrough atrial fibrillation as needed 45 tablet 1  . flecainide (TAMBOCOR) 100 MG tablet Take 1 tablet (100 mg total) by mouth 2 (two) times daily. 180 tablet 1  . pantoprazole (PROTONIX) 40 MG tablet Take 1 tablet (40 mg total) by mouth daily. 90 tablet 0  . [name] 150 MG CAPS capsule TAKE 1 CAPSULE BY MOUTH 2 TIMES DAILY. 60 capsule 1  . traMADol (ULTRAM) 50 MG tablet Take 50 mg by mouth every 8 (eight) hours as needed for moderate pain.     No current facility-administered medications for this visit.      Past Medical History:  Diagnosis Date  . Diabetes mellitus    pt is unaware and is on no medicine for this (A1C 5.9 2012)  . Dyslipidemia   . Hypertension   .  Overweight   . Paroxysmal atrial fibrillation (HCC)    chads2vasc score of at least 2  . Snoring    cancelled prior sleep study due to costs  . Typical atrial flutter (HCC)     ROS:   All systems reviewed and negative except as noted in the HPI.   Past Surgical History:  Procedure Laterality Date  . APPENDECTOMY    . ATRIAL FIBRILLATION ABLATION N/A 07/26/2014   Procedure: ATRIAL FIBRILLATION ABLATION;  Surgeon: Thompson Grayer, MD;  Location: [facility] CATH LAB;  Service: Cardiovascular;  Laterality: N/A;  . BREAST LUMPECTOMY     benign  . CARDIOVERSION N/A 07/03/2014   Procedure: CARDIOVERSION;  Surgeon: Carlena Bjornstad, MD;  Location: [place];  Service: Cardiovascular;  Laterality: N/A;  . KNEE SURGERY    . REPLACEMENT TOTAL KNEE     bilateral, different times  . TEE WITHOUT CARDIOVERSION N/A 07/26/2014   Procedure: TRANSESOPHAGEAL ECHOCARDIOGRAM (TEE);  Surgeon: Lelon Perla, MD;  Location: [facility] ENDOSCOPY;  Service: Cardiovascular;  Laterality: N/A;  . TONSILLECTOMY AND ADENOIDECTOMY       Family History  Problem Relation Age of Onset  . Heart attack Mother   . Heart disease Father        cabg x 7  . Heart attack Father   .  Stroke Father      Social History   Socioeconomic History  . Marital status: Married    Spouse name: Not on file  . Number of children: Not on file  . Years of education: Not on file  . Highest education level: Not on file  Occupational History  . Not on file  Social Needs  . Financial resource strain: Not on file  . Food insecurity    Worry: Not on file    Inability: Not on file  . Transportation needs    Medical: Not on file    Non-medical: Not on file  Tobacco Use  . Smoking status: Never Smoker  . Smokeless tobacco: Never Used  Substance and Sexual Activity  . Alcohol use: No  . Drug use: No  . Sexual activity: Not on file  Lifestyle  . Physical activity    Days per week: Not on file    Minutes per session: Not on file  . Stress: Not  on file  Relationships  . Social Musician on phone: Not on file    Gets together: Not on file    Attends religious service: Not on file    Active member of club or organization: Not on file    Attends meetings of clubs or organizations: Not on file    Relationship status: Not on file  . Intimate partner violence    Fear of current or ex partner: Not on file    Emotionally abused: Not on file    Physically abused: Not on file    Forced sexual activity: Not on file  Other Topics Concern  . Not on file  Social History Narrative   Pt lives in [place] with husband daughter.  Her husband is chronically ill with CHF and morbid obesity (420 lbs). She provides total care for him.   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     BP 136/74   Pulse 63   Ht 5\' 9"  (1.753 m)   Wt 258 lb 6.4 oz (117.2 kg)   SpO2 97%   BMI 38.16 kg/m   Physical Exam:  Well appearing NAD HEENT: Unremarkable Neck:  No JVD, no thyromegally Lymphatics:  No adenopathy Back:  No CVA tenderness Lungs:  Clear with no wheezes HEART:  Regular rate rhythm, no murmurs, no rubs, no clicks Abd:  soft, positive bowel sounds, no organomegally, no rebound, no guarding Ext:  2 plus pulses, no edema, no cyanosis, no clubbing Skin:  No rashes no nodules Neuro:  CN II through XII intact, motor grossly intact  EKG - nsr   Assess/Plan: 1. PAF - she is mostly maintaining NSR. She will continue her flecainide.  2. Obesity - I encouraged the patient to lose weight.  3. Coags - she has had no bleeding since her last visit. She will continue the pradaxa.  .D.

## 2019-04-14 ENCOUNTER — Other Ambulatory Visit: Payer: Self-pay | Admitting: Internal Medicine

## 2019-04-14 MED FILL — CARTIA XT 120 MG CP24: 120 | 90 days supply | Qty: 90 | Fill #0

## 2019-04-14 MED FILL — FLECAINIDE ACETATE 100 MG T: 100 | 90 days supply | Qty: 180 | Fill #1

## 2019-04-14 NOTE — Telephone Encounter (Signed)
Age 69, weight 117.2kg, SCr not checked since 2017.  Called pt, no answer and no VM set up. Will need labs to ensure pt remains on appropriate dose of [name].

## 2019-04-16 ENCOUNTER — Telehealth: Payer: Self-pay | Admitting: Internal Medicine

## 2019-04-16 ENCOUNTER — Other Ambulatory Visit: Payer: Self-pay | Admitting: Internal Medicine

## 2019-04-16 MED FILL — PRADAXA 150 MG CAP: 150 | 30 days supply | Qty: 60 | Fill #0

## 2019-04-16 NOTE — Telephone Encounter (Signed)
Patient returned call. She is mad that she was not asked to get blood work done when she was here in October. She knew from previous refill that she needed it, but did not say anything. She states she doesn't have time to come get labs done here. I offered to put orders in for a [facility], but she refused to go to a [facility]. She stated "ill just stop taking it and have a stroke its fine." I offered to give patient a 1-2 month supply until she can get labs, but she repeated  "ill just stop taking it and have a stroke its fine." She requested she get labs the next time she comes to heart care- this isn't supposed to be until Oct. She has not yet been set up with a new PCP. She works at [employer], but states they will not do labs for [employer]. I advised that I will give Rx for 6 months, that's all im allowed to give, but this will come back up again then. Patient thanked me and apologized for being "ugly"

## 2019-04-16 NOTE — Telephone Encounter (Signed)
Called patient- see note under refill encounter

## 2019-04-16 NOTE — Telephone Encounter (Signed)
Patient states she is returning [employer] phone call, please advise.

## 2019-06-01 ENCOUNTER — Telehealth: Payer: Self-pay | Admitting: Internal Medicine

## 2019-06-01 NOTE — Telephone Encounter (Signed)
FMLA form received from [employer] at [employer]. Placed in box for Dr. Ladona Ridgel to review. 06/01/19 vlm

## 2019-06-03 ENCOUNTER — Telehealth: Payer: Self-pay | Admitting: Internal Medicine

## 2019-06-03 NOTE — Telephone Encounter (Signed)
Signed FMLA/disability form returned to [employer] at [employer] bldg. 06/03/19 vlm 

## 2019-07-15 ENCOUNTER — Other Ambulatory Visit: Payer: Self-pay | Admitting: Internal Medicine

## 2019-07-15 MED FILL — PRADAXA 150 MG CAP: 150 | 30 days supply | Qty: 60 | Fill #1

## 2019-07-15 MED FILL — CARTIA XT 120 MG CP24: 120 | 90 days supply | Qty: 90 | Fill #1

## 2019-07-16 MED FILL — FLECAINIDE ACETATE 100 MG T: 100 | 90 days supply | Qty: 180 | Fill #0

## 2019-10-07 MED FILL — CARTIA XT 120 MG CP24: 120 | 90 days supply | Qty: 90 | Fill #2

## 2019-10-07 MED FILL — PRADAXA 150 MG CAP: 150 | 30 days supply | Qty: 60 | Fill #2

## 2019-10-07 MED FILL — FLECAINIDE ACETATE 100 MG T: 100 | 90 days supply | Qty: 180 | Fill #1

## 2019-12-28 ENCOUNTER — Other Ambulatory Visit: Payer: Self-pay | Admitting: Internal Medicine

## 2019-12-28 MED FILL — CARTIA XT 120 MG CP24: 120 | 90 days supply | Qty: 90 | Fill #0

## 2019-12-28 MED FILL — PRADAXA 150 MG CAP: 150 | 30 days supply | Qty: 60 | Fill #3

## 2019-12-28 MED FILL — FLECAINIDE ACETATE 100 MG T: 100 | 90 days supply | Qty: 180 | Fill #0

## 2020-03-01 ENCOUNTER — Encounter: Payer: Self-pay | Admitting: Internal Medicine

## 2020-03-01 ENCOUNTER — Other Ambulatory Visit: Payer: Self-pay

## 2020-03-01 ENCOUNTER — Ambulatory Visit (INDEPENDENT_AMBULATORY_CARE_PROVIDER_SITE_OTHER): Payer: 59 | Admitting: Internal Medicine

## 2020-03-01 VITALS — BP 122/78 | HR 72 | Ht 69.0 in | Wt 231.6 lb

## 2020-03-01 DIAGNOSIS — I480 Paroxysmal atrial fibrillation: Secondary | ICD-10-CM

## 2020-03-01 DIAGNOSIS — I10 Essential (primary) hypertension: Secondary | ICD-10-CM | POA: Diagnosis not present

## 2020-03-01 NOTE — Progress Notes (Signed)
HPI [name] Strickland returns today for followup of her atrial fib. She is an obese 69 yo woman with atrial fib who has been treated with flecainide over the years. Since I saw her last, she notes that she has lost over 30 lbs. She is in the process of moving. She has some atrial fib but only a few episodes.  Allergies  Allergen Reactions  . Shellfish-Derived Products Itching and Swelling       . Iodine Other (See Comments)    Unknown childhood reaction to topical iodine  . Contrast Media [Iodinated Diagnostic Agents] Other (See Comments)    Unknown reaction to topical iodine as a child  . Sulfonamide Derivatives Itching and Rash     Current Outpatient Medications  Medication Sig Dispense Refill  . acetaminophen (TYLENOL) 650 MG CR tablet Take 650 mg by mouth at bedtime as needed for pain.    Marland Kitchen ALPRAZolam (XANAX) 0.5 MG tablet as directed. TAKE 1 TABLET BY MOUTH EVERY NIGHT AT BEDTIME AS NEEDED    . CARTIA XT 120 MG 24 hr capsule TAKE 1 CAPSULE BY MOUTH DAILY. 90 capsule 2  . cyclobenzaprine (FLEXERIL) 10 MG tablet Take 0.5-1 tablets (5-10 mg total) by mouth at bedtime as needed for muscle spasms. 30 tablet 0  . flecainide (TAMBOCOR) 100 MG tablet TAKE 1 TABLET BY MOUTH 2 TIMES DAILY. 180 tablet 1  . pantoprazole (PROTONIX) 40 MG tablet Take 1 tablet (40 mg total) by mouth daily. 90 tablet 0  . [name] 150 MG CAPS capsule TAKE 1 CAPSULE BY MOUTH TWICE A DAY 60 capsule 5  . traMADol (ULTRAM) 50 MG tablet Take 50 mg by mouth every 8 (eight) hours as needed for moderate pain.     No current facility-administered medications for this visit.     Past Medical History:  Diagnosis Date  . Diabetes mellitus    pt is unaware and is on no medicine for this (A1C 5.9 2012)  . Dyslipidemia   . Hypertension   . Overweight   . Paroxysmal atrial fibrillation (HCC)    chads2vasc score of at least 2  . Snoring    cancelled prior sleep study due to costs  . Typical atrial flutter (HCC)      ROS:   All systems reviewed and negative except as noted in the HPI.   Past Surgical History:  Procedure Laterality Date  . APPENDECTOMY    . ATRIAL FIBRILLATION ABLATION N/A 07/26/2014   Procedure: ATRIAL FIBRILLATION ABLATION;  Surgeon: [name], MD;  Location: [facility] CATH LAB;  Service: Cardiovascular;  Laterality: N/A;  . BREAST LUMPECTOMY     benign  . CARDIOVERSION N/A 07/03/2014   Procedure: CARDIOVERSION;  Surgeon: Luis Abed, MD;  Location: [facility] OR;  Service: Cardiovascular;  Laterality: N/A;  . KNEE SURGERY    . REPLACEMENT TOTAL KNEE     bilateral, different times  . TEE WITHOUT CARDIOVERSION N/A 07/26/2014   Procedure: TRANSESOPHAGEAL ECHOCARDIOGRAM (TEE);  Surgeon: Lewayne Bunting, MD;  Location: [facility] ENDOSCOPY;  Service: Cardiovascular;  Laterality: N/A;  . TONSILLECTOMY AND ADENOIDECTOMY       Family History  Problem Relation Age of Onset  . Heart attack Mother   . Heart disease Father        cabg x 7  . Heart attack Father   . Stroke Father      Social History   Socioeconomic History  . Marital status: Married    Spouse name:  Not on file  . Number of children: Not on file  . Years of education: Not on file  . Highest education level: Not on file  Occupational History  . Not on file  Tobacco Use  . Smoking status: Never Smoker  . Smokeless tobacco: Never Used  Vaping Use  . Vaping Use: Never used  Substance and Sexual Activity  . Alcohol use: No  . Drug use: No  . Sexual activity: Not on file  Other Topics Concern  . Not on file  Social History Narrative   Pt lives in [place] with husband daughter.  Her husband is chronically ill with CHF and morbid obesity (420 lbs). She provides total care for him.   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   Social Determinants of Health   Financial Resource Strain:   . Difficulty of Paying Living Expenses: Not on file  Food Insecurity:   . Worried About Programme researcher, broadcasting/film/video in the Last  Year: Not on file  . Ran Out of Food in the Last Year: Not on file  Transportation Needs:   . Lack of Transportation (Medical): Not on file  . Lack of Transportation (Non-Medical): Not on file  Physical Activity:   . Days of Exercise per Week: Not on file  . Minutes of Exercise per Session: Not on file  Stress:   . Feeling of Stress : Not on file  Social Connections:   . Frequency of Communication with Friends and Family: Not on file  . Frequency of Social Gatherings with Friends and Family: Not on file  . Attends Religious Services: Not on file  . Active Member of Clubs or Organizations: Not on file  . Attends Banker Meetings: Not on file  . Marital Status: Not on file  Intimate Partner Violence:   . Fear of Current or Ex-Partner: Not on file  . Emotionally Abused: Not on file  . Physically Abused: Not on file  . Sexually Abused: Not on file     BP 122/78   Pulse 72   Ht 5\' 9"  (1.753 m)   Wt 231 lb 9.6 oz (105.1 kg)   SpO2 98%   BMI 34.20 kg/m   Physical Exam:  Well appearing NAD HEENT: Unremarkable Neck:  No JVD, no thyromegally Lymphatics:  No adenopathy Back:  No CVA tenderness Lungs:  Clear with no wheezes HEART:  Regular rate rhythm, no murmurs, no rubs, no clicks Abd:  soft, positive bowel sounds, no organomegally, no rebound, no guarding Ext:  2 plus pulses, no edema, no cyanosis, no clubbing Skin:  No rashes no nodules Neuro:  CN II through XII intact, motor grossly intact  EKG - NSR  Assess/Plan: 1. PAF - she is mostly maintaining NSR. She will continue her flecainide.  2. Obesity - I encouraged the patient to lose weight and she has. Her goal is to get under 200.   3. Coags - she has had no bleeding since her last visit. She will continue the pradaxa.  Euva Rundell,MD

## 2020-03-01 NOTE — Patient Instructions (Signed)

## 2020-03-09 ENCOUNTER — Ambulatory Visit: Payer: 59 | Admitting: Family Medicine

## 2020-04-05 MED FILL — FLECAINIDE ACETATE 100 MG T: 100 | 90 days supply | Qty: 180 | Fill #1

## 2020-04-05 MED FILL — PRADAXA 150 MG CAP: 150 | 30 days supply | Qty: 60 | Fill #4

## 2020-04-05 MED FILL — CARTIA XT 120 MG CP24: 120 | 90 days supply | Qty: 90 | Fill #1

## 2020-07-03 ENCOUNTER — Other Ambulatory Visit: Payer: Self-pay

## 2020-07-03 ENCOUNTER — Other Ambulatory Visit: Payer: Self-pay | Admitting: Internal Medicine

## 2020-07-03 MED FILL — Diltiazem HCl Coated Beads Cap ER 24HR 120 MG: ORAL | 90 days supply | Qty: 90 | Fill #0 | Status: AC

## 2020-07-04 NOTE — Telephone Encounter (Signed)
Patient called back. Reported there is no way for her to come to get labs because her husband passed away and she has moved into a new house.  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 I said you can have the labs done at any [facility] but she said she would just do without the medications.  Told her I did not want her to be without medication but asked her to please make appointment for labs. Patient said she would try.

## 2020-07-04 NOTE — Telephone Encounter (Signed)
Pt last saw Dr Ladona Ridgel 03/01/20, last labs 10/13/15, no recent labs in [place], [place] or care everywhere. Will need updated labwork to refill [name] rx. Attempted to contact pt, LMOM TCB for appt for CBC and BMP.

## 2020-07-05 ENCOUNTER — Other Ambulatory Visit: Payer: Self-pay

## 2020-07-05 MED ORDER — PRADAXA 150 MG PO CAPS
150.0000 mg | ORAL_CAPSULE | Freq: Two times a day (BID) | ORAL | 5 refills | Status: DC
  Filled 2020-07-05: qty 60, 30d supply, fill #0
  Filled 2020-10-16 (×2): qty 60, 30d supply, fill #1
  Filled 2021-01-10: qty 60, 30d supply, fill #2
  Filled 2021-02-13: qty 60, 30d supply, fill #3
  Filled 2021-03-22: qty 60, 30d supply, fill #4
  Filled 2021-04-29 – 2021-05-01 (×2): qty 60, 30d supply, fill #5

## 2020-07-05 MED ORDER — FLECAINIDE ACETATE 100 MG PO TABS
ORAL_TABLET | Freq: Two times a day (BID) | ORAL | 1 refills | Status: DC
  Filled 2020-07-05: qty 180, 90d supply, fill #0
  Filled 2020-10-16: qty 180, 90d supply, fill #1

## 2020-07-06 ENCOUNTER — Other Ambulatory Visit: Payer: Self-pay

## 2020-07-07 ENCOUNTER — Telehealth: Payer: Self-pay | Admitting: Internal Medicine

## 2020-07-07 NOTE — Telephone Encounter (Signed)
Patient states she contacted [employer] about getting her FMLA paperwork, but they said no longer do this for our office. She would like to know who she needs to contact. Phone: [phone]

## 2020-07-19 DIAGNOSIS — Z0279 Encounter for issue of other medical certificate: Secondary | ICD-10-CM

## 2020-07-20 ENCOUNTER — Telehealth: Payer: Self-pay | Admitting: Internal Medicine

## 2020-07-20 NOTE — Telephone Encounter (Signed)
Forms to be completed received via fax and completed authorization received. Forms have been placed in Dr.Taylor's box to be completed. AO 07/20/20

## 2020-10-16 ENCOUNTER — Other Ambulatory Visit: Payer: Self-pay

## 2020-10-16 ENCOUNTER — Other Ambulatory Visit: Payer: Self-pay | Admitting: Internal Medicine

## 2020-10-17 ENCOUNTER — Other Ambulatory Visit: Payer: Self-pay

## 2020-10-17 ENCOUNTER — Other Ambulatory Visit: Payer: Self-pay | Admitting: Internal Medicine

## 2020-10-18 ENCOUNTER — Other Ambulatory Visit: Payer: Self-pay

## 2020-10-18 MED FILL — Diltiazem HCl Coated Beads Cap ER 24HR 120 MG: ORAL | 90 days supply | Qty: 90 | Fill #0 | Status: AC

## 2020-11-09 ENCOUNTER — Other Ambulatory Visit: Payer: Self-pay

## 2020-11-09 ENCOUNTER — Encounter (HOSPITAL_COMMUNITY): Payer: Self-pay

## 2020-11-09 ENCOUNTER — Emergency Department (HOSPITAL_COMMUNITY)
Admission: EM | Admit: 2020-11-09 | Discharge: 2020-11-09 | Disposition: A | Discharge status: Home / Self Care | Payer: 59 | Attending: Emergency Medicine | Admitting: Emergency Medicine

## 2020-11-09 ENCOUNTER — Other Ambulatory Visit (HOSPITAL_COMMUNITY): Payer: Self-pay

## 2020-11-09 ENCOUNTER — Telehealth: Payer: Self-pay

## 2020-11-09 ENCOUNTER — Emergency Department (HOSPITAL_COMMUNITY): Payer: 59

## 2020-11-09 DIAGNOSIS — Z96653 Presence of artificial knee joint, bilateral: Secondary | ICD-10-CM | POA: Diagnosis not present

## 2020-11-09 DIAGNOSIS — R519 Headache, unspecified: Secondary | ICD-10-CM | POA: Diagnosis not present

## 2020-11-09 DIAGNOSIS — I10 Essential (primary) hypertension: Secondary | ICD-10-CM | POA: Diagnosis not present

## 2020-11-09 DIAGNOSIS — R42 Dizziness and giddiness: Secondary | ICD-10-CM | POA: Diagnosis not present

## 2020-11-09 DIAGNOSIS — I480 Paroxysmal atrial fibrillation: Secondary | ICD-10-CM | POA: Diagnosis not present

## 2020-11-09 DIAGNOSIS — R202 Paresthesia of skin: Secondary | ICD-10-CM | POA: Diagnosis not present

## 2020-11-09 DIAGNOSIS — E1165 Type 2 diabetes mellitus with hyperglycemia: Secondary | ICD-10-CM | POA: Diagnosis not present

## 2020-11-09 DIAGNOSIS — G43809 Other migraine, not intractable, without status migrainosus: Secondary | ICD-10-CM | POA: Diagnosis not present

## 2020-11-09 DIAGNOSIS — G4489 Other headache syndrome: Secondary | ICD-10-CM | POA: Diagnosis not present

## 2020-11-09 DIAGNOSIS — E119 Type 2 diabetes mellitus without complications: Secondary | ICD-10-CM | POA: Insufficient documentation

## 2020-11-09 DIAGNOSIS — R739 Hyperglycemia, unspecified: Secondary | ICD-10-CM

## 2020-11-09 DIAGNOSIS — R29810 Facial weakness: Secondary | ICD-10-CM | POA: Diagnosis not present

## 2020-11-09 LAB — TROPONIN I (HIGH SENSITIVITY)
Troponin I (High Sensitivity): 16 ng/L (ref <18)
Troponin I (High Sensitivity): 16 ng/L (ref <18)

## 2020-11-09 LAB — PROTIME-INR
INR: 1.5 — ABNORMAL HIGH (ref 0.8–1.2)
Prothrombin Time: 17.6 seconds — ABNORMAL HIGH (ref 11.4–15.2)

## 2020-11-09 LAB — CBC WITH DIFFERENTIAL/PLATELET
Abs Immature Granulocytes: 0.02 10*3/uL (ref 0.00–0.07)
Basophils Absolute: 0 10*3/uL (ref 0.0–0.1)
Basophils Relative: 1 %
Eosinophils Absolute: 0.2 10*3/uL (ref 0.0–0.5)
Eosinophils Relative: 3 %
HCT: 41.8 % (ref 36.0–46.0)
Hemoglobin: 14.1 g/dL (ref 12.0–15.0)
Immature Granulocytes: 0 %
Lymphocytes Relative: 26 %
Lymphs Abs: 1.7 10*3/uL (ref 0.7–4.0)
MCH: 28.1 pg (ref 26.0–34.0)
MCHC: 33.7 g/dL (ref 30.0–36.0)
MCV: 83.3 fL (ref 80.0–100.0)
Monocytes Absolute: 0.4 10*3/uL (ref 0.1–1.0)
Monocytes Relative: 6 %
Neutro Abs: 4.3 10*3/uL (ref 1.7–7.7)
Neutrophils Relative %: 64 %
Platelets: 292 10*3/uL (ref 150–400)
RBC: 5.02 MIL/uL (ref 3.87–5.11)
RDW: 12.9 % (ref 11.5–15.5)
WBC: 6.6 10*3/uL (ref 4.0–10.5)
nRBC: 0 % (ref 0.0–0.2)

## 2020-11-09 LAB — COMPREHENSIVE METABOLIC PANEL
ALT: 13 U/L (ref 0–44)
AST: 15 U/L (ref 15–41)
Albumin: 3.3 g/dL — ABNORMAL LOW (ref 3.5–5.0)
Alkaline Phosphatase: 63 U/L (ref 38–126)
Anion gap: 9 (ref 5–15)
BUN: 5 mg/dL — ABNORMAL LOW (ref 8–23)
CO2: 26 mmol/L (ref 22–32)
Calcium: 9 mg/dL (ref 8.9–10.3)
Chloride: 101 mmol/L (ref 98–111)
Creatinine, Ser: 0.68 mg/dL (ref 0.44–1.00)
GFR, Estimated: >60 mL/min (ref >60)
Glucose, Bld: 295 mg/dL — ABNORMAL HIGH (ref 70–99)
Potassium: 4 mmol/L (ref 3.5–5.1)
Sodium: 136 mmol/L (ref 135–145)
Total Bilirubin: 0.7 mg/dL (ref 0.3–1.2)
Total Protein: 6.1 g/dL — ABNORMAL LOW (ref 6.5–8.1)

## 2020-11-09 LAB — CBG MONITORING, ED
Glucose-Capillary: 276 mg/dL — ABNORMAL HIGH (ref 70–99)
Glucose-Capillary: 222 mg/dL — ABNORMAL HIGH (ref 70–99)

## 2020-11-09 MED ORDER — KETOROLAC TROMETHAMINE 30 MG/ML IJ SOLN
30.0000 mg | Freq: Once | INTRAMUSCULAR | Status: AC
  Administered 2020-11-09: 30 mg via INTRAVENOUS
  Filled 2020-11-09: qty 1

## 2020-11-09 MED ORDER — SODIUM CHLORIDE 0.9 % IV BOLUS
1000.0000 mL | Freq: Once | INTRAVENOUS | Status: AC
  Administered 2020-11-09: 1000 mL via INTRAVENOUS

## 2020-11-09 MED ORDER — METOCLOPRAMIDE HCL 5 MG/ML IJ SOLN
10.0000 mg | Freq: Once | INTRAMUSCULAR | Status: AC
  Administered 2020-11-09: 10 mg via INTRAVENOUS
  Filled 2020-11-09: qty 2

## 2020-11-09 MED ORDER — METFORMIN HCL 500 MG PO TABS
500.0000 mg | ORAL_TABLET | Freq: Two times a day (BID) | ORAL | 0 refills | Status: DC
  Filled 2020-11-09 – 2020-11-13 (×8): qty 60, 30d supply, fill #0

## 2020-11-09 NOTE — Telephone Encounter (Signed)
Agree.  I thank all involved.

## 2020-11-09 NOTE — ED Triage Notes (Signed)
Pt BIB by [facility] for numbness and tingling in the right mouth and arm. At 0200 pt complained of at frontal lobe headache, dizziness and nausea that increases with standin . 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 Per MD at her work she had a negative stroke screen. In route with EMS the pt complained of Rt hand numbness that quickly resolved.   HR 62  180/90  12 lead: Afib (normal)

## 2020-11-09 NOTE — ED Notes (Signed)
Reviewed discharge instructions with patient. Follow-up care and medications reviewed. Patient  verbalized understanding. Patient A&Ox4, VSS, and ambulatory with steady gait upon discharge.  °

## 2020-11-09 NOTE — Telephone Encounter (Signed)
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 Asked a CMA to take her BP. Reading was 196/94, HR 65, 02 Sat was 99%. Pt reported getting up last night to use the bathroom and experiencing light headedness, seeing abnormalities in one side of her eye and feeling her upper left lip go numb. She also reported when she was laying in bed her L arm went numb but not completely, she still had some feeling in her arm. She reports all of these symptoms lasted about one minute. Pt had taken tylenol for the HA before driving to work this morning with no relief. Pt denied any numbness or tingling at the time. Pt has hx of Afib and was concerned she may be having a stroke. Dr. Selena Batten assessed the pt because Dr. Para March was not in the office yet. Dr. Para March advised the pt be transported to the ER by EMS. Pt agreed to EMS and going to the ER. EMS was called and arrived and assessed pt for stroke symptoms. Pt did not have any stroke symptoms at the time of the assessment. PT was transported to [facility] ER.

## 2020-11-09 NOTE — ED Provider Notes (Signed)
[facility] EMERGENCY DEPARTMENT Provider Note  CSN: [id number] Arrival date & time: 11/09/20 0859    History Chief Complaint  Patient presents with  . Headache    [name] Strickland is a 70 y.o. female with prior history of PAF on [name], HTN well controlled, and documented history of DM, but not on any medications reports she went to be feeling well last night. She got up around 2am to use the restroom and reported seeing 'prisms' in her left visual field. When she went back to bed, she had a brief, 1-2 min, episode of tingling in her L face and L arm. She went back to sleep after those symptoms resolved but woke up to get ready for work around 7am with a severe pounding frontal headache, that has been associated with nausea and photophobia. No prior history of similar headaches or migraines. She has felt generally weak with vague dizziness. She works at [employer] and was able to drive to work although she reports she felt like she needed to drive slower than usual for safety. On arrival there she was still feeling poorly and so staff checked her vitals and noted her BP to be significantly elevated. She had a brief episode of Afib with EMS but has been in sinus rhythm since arrival here. She denies any further numbness and no weakness of her extremities. She has not had any facial droop or slurred speech. She states she was able to walk while at work, but felt like she was sinking into the floor.    Past Medical History:  Diagnosis Date  . Diabetes mellitus    pt is unaware and is on no medicine for this (A1C 5.9 2012)  . Dyslipidemia   . Hypertension   . Overweight   . Paroxysmal atrial fibrillation (HCC)    chads2vasc score of at least 2  . Snoring    cancelled prior sleep study due to costs  . Typical atrial flutter [facility])     Past Surgical History:  Procedure Laterality Date  . APPENDECTOMY    . ATRIAL FIBRILLATION ABLATION N/A 07/26/2014   Procedure: ATRIAL FIBRILLATION  ABLATION;  Surgeon: [name], MD;  Location: [facility] CATH LAB;  Service: Cardiovascular;  Laterality: N/A;  . BREAST LUMPECTOMY     benign  . CARDIOVERSION N/A 07/03/2014   Procedure: CARDIOVERSION;  Surgeon: Luis Abed, MD;  Location: [facility] OR;  Service: Cardiovascular;  Laterality: N/A;  . KNEE SURGERY    . REPLACEMENT TOTAL KNEE     bilateral, different times  . TEE WITHOUT CARDIOVERSION N/A 07/26/2014   Procedure: TRANSESOPHAGEAL ECHOCARDIOGRAM (TEE);  Surgeon: Lewayne Bunting, MD;  Location: [facility] ENDOSCOPY;  Service: Cardiovascular;  Laterality: N/A;  . TONSILLECTOMY AND ADENOIDECTOMY      Family History  Problem Relation Age of Onset  . Heart attack Mother   . Heart disease Father        cabg x 7  . Heart attack Father   . Stroke Father     Social History   Tobacco Use  . Smoking status: Never  . Smokeless tobacco: Never  Vaping Use  . Vaping Use: Never used  Substance Use Topics  . Alcohol use: No  . Drug use: No     Home Medications Prior to Admission medications   Medication Sig Start Date End Date Taking? Authorizing Provider  acetaminophen (TYLENOL) 650 MG CR tablet Take 650 mg by mouth at bedtime as needed for pain.  [provider]  ALPRAZolam Prudy Feeler) 0.5 MG tablet as directed. TAKE 1 TABLET BY MOUTH EVERY NIGHT AT BEDTIME AS NEEDED    [provider]  cyclobenzaprine (FLEXERIL) 10 MG tablet Take 0.5-1 tablets (5-10 mg total) by mouth at bedtime as needed for muscle spasms. 11/03/17   Tower, Audrie Gallus, MD  diltiazem (CARDIZEM CD) 120 MG 24 hr capsule TAKE 1 CAPSULE BY MOUTH DAILY. 10/18/20 10/18/21  Marinus Maw, MD  flecainide (TAMBOCOR) 100 MG tablet TAKE 1 TABLET BY MOUTH 2 TIMES DAILY. 07/05/20 07/05/21  Marinus Maw, MD  pantoprazole (PROTONIX) 40 MG tablet Take 1 tablet (40 mg total) by mouth daily. 05/06/17   Marinus Maw, MD  [name] 150 MG CAPS capsule TAKE 1 CAPSULE BY MOUTH TWICE A DAY 07/05/20 07/05/21  Marinus Maw, MD  traMADol  (ULTRAM) 50 MG tablet Take 50 mg by mouth every 8 (eight) hours as needed for moderate pain.    [provider]     Allergies    Shellfish-derived products, Fish allergy, Iodine, Contrast media [iodinated diagnostic agents], and Sulfonamide derivatives   Review of Systems   Review of Systems A comprehensive review of systems was completed and negative except as noted in HPI.     Physical Exam BP (!) 176/94   Pulse 63   Temp 98 F (36.7 C) (Oral)   Resp 12   Ht 5\' 9"  (1.753 m)   Wt 100.7 kg   SpO2 100%   BMI 32.78 kg/m   Physical Exam Vitals and nursing note reviewed.  Constitutional:      Appearance: Normal appearance.  HENT:     Head: Normocephalic and atraumatic.     Nose: Nose normal.     Mouth/Throat:     Mouth: Mucous membranes are moist.  Eyes:     Extraocular Movements: Extraocular movements intact.     Conjunctiva/sclera: Conjunctivae normal.  Cardiovascular:     Rate and Rhythm: Normal rate.  Pulmonary:     Effort: Pulmonary effort is normal.     Breath sounds: Normal breath sounds.  Abdominal:     General: Abdomen is flat.     Palpations: Abdomen is soft.     Tenderness: There is no abdominal tenderness.  Musculoskeletal:        General: No swelling. Normal range of motion.     Cervical back: Neck supple.  Skin:    General: Skin is warm and dry.  Neurological:     General: No focal deficit present.     Mental Status: She is alert and oriented to person, place, and time.     Cranial Nerves: No cranial nerve deficit.     Sensory: No sensory deficit.     Motor: No weakness.     Coordination: Coordination normal.  Psychiatric:        Mood and Affect: Mood normal.     ED Results / Procedures / Treatments   Labs (all labs ordered are listed, but only abnormal results are displayed) Labs Reviewed  CBG MONITORING, ED - Abnormal; Notable for the following components:      Result Value   Glucose-Capillary 276 (*)    All other components  within normal limits  COMPREHENSIVE METABOLIC PANEL  CBC WITH DIFFERENTIAL/PLATELET  PROTIME-INR  TROPONIN I (HIGH SENSITIVITY)    EKG EKG Interpretation  Date/Time:  Thursday November 09 2020 09:15:07 EDT Ventricular Rate:  61 PR Interval:  208 QRS Duration: 110 QT Interval:  432 QTC  Calculation: 436 R Axis:   -15 Text Interpretation: Sinus rhythm Borderline left axis deviation Low voltage, precordial leads Probable anteroseptal infarct, old No significant change since last tracing Confirmed by Susy Frizzle [phone]) on 11/09/2020 9:36:05 AM  Radiology No results found.  Procedures Procedures  Medications Ordered in the ED Medications  sodium chloride 0.9 % bolus 1,000 mL (has no administration in time range)     MDM Rules/Calculators/A&P MDM Patient with new onset headache, has some features of migraine but this is new for her. Given she is on [name] for PAF and HTN above her normal, consider ICH. Will check labs, send for CT.   ED Course  I have reviewed the triage vital signs and the nursing notes.  Pertinent labs & imaging results that were available during my care of the patient were reviewed by me and considered in my medical decision making (see chart for details).  Clinical Course as of 11/10/20 0700  Thu Nov 09, 2020  1008 Patient's CBG on arrival is elevated, although DM is listed in PMH she states she has never had to be on any medications in the past. Will give IVF and recheck.  [CS]  1147 CBC is normal.  [CS]  1336 CMP with hyperglycemia, but no signs of DKA.  [CS]  1349 Patient reports she is feeling much better. Will recheck CBG when IVF complete.  [CS]    Clinical Course User Index [CS] Pollyann Savoy, MD    Final Clinical Impression(s) / ED Diagnoses Final diagnoses:  None    Rx / DC Orders ED Discharge Orders     None        Pollyann Savoy, MD 11/10/20 0700

## 2020-11-09 NOTE — ED Notes (Signed)
Instructed by EDP to have the patient ambulate. Pt walked around the bed without any assistance, described feeling "a fullness" in her head, besides this feeling she stated that she felt fine.

## 2020-11-10 ENCOUNTER — Other Ambulatory Visit: Payer: Self-pay

## 2020-11-10 ENCOUNTER — Other Ambulatory Visit (HOSPITAL_COMMUNITY): Payer: Self-pay

## 2020-11-13 ENCOUNTER — Other Ambulatory Visit: Payer: Self-pay

## 2020-11-13 ENCOUNTER — Other Ambulatory Visit (HOSPITAL_COMMUNITY): Payer: Self-pay

## 2020-11-17 ENCOUNTER — Other Ambulatory Visit: Payer: Self-pay

## 2020-11-17 ENCOUNTER — Telehealth: Payer: Self-pay

## 2020-11-17 MED ORDER — TRAMADOL HCL 50 MG PO TABS
50.0000 mg | ORAL_TABLET | Freq: Three times a day (TID) | ORAL | 0 refills | Status: AC | PRN
  Filled 2020-11-17: qty 15, 5d supply, fill #0

## 2020-11-17 MED ORDER — CYCLOBENZAPRINE HCL 10 MG PO TABS
5.0000 mg | ORAL_TABLET | Freq: Every evening | ORAL | 0 refills | Status: DC | PRN
  Filled 2020-11-17: qty 30, 30d supply, fill #0

## 2020-11-17 NOTE — Telephone Encounter (Signed)
Pt presents this morning, in office, with sciatica pain down her L leg x 1 week. States she has done exercises, tylenol, applied heat, and voltaren gel. Symptoms and pain worsened last night.  Please advise. If Rx sent in, pt would like it sent to [facility] pharmacy.

## 2020-11-17 NOTE — Telephone Encounter (Signed)
Examined pt. No red flags. No vertebral pain, no neurologic changes.  Very typical  left sided sciatica.  NSAID contraindicated given on pradaxa.  Recent new dx of DM , poor control.. prednisone taper not ideal.   Will treat with tramadol and cyclobenzaprine.   She has transfer of care appt with me next month and we can follow up on this issue then.  Return precautions given.    PDMP reviewed.. no red flags.   Please tell Jameshia meds sent to [facility] pharmacy.

## 2020-11-17 NOTE — Telephone Encounter (Signed)
Pt notified of Rx's sent in. Pt states extreme appreciation.

## 2020-11-30 ENCOUNTER — Encounter: Payer: Self-pay | Admitting: Family Medicine

## 2020-11-30 ENCOUNTER — Other Ambulatory Visit: Payer: Self-pay

## 2020-11-30 ENCOUNTER — Ambulatory Visit: Payer: 59 | Admitting: Family Medicine

## 2020-11-30 VITALS — BP 138/82 | HR 65 | Temp 97.3°F | Ht 67.75 in | Wt 223.0 lb

## 2020-11-30 DIAGNOSIS — I480 Paroxysmal atrial fibrillation: Secondary | ICD-10-CM | POA: Diagnosis not present

## 2020-11-30 DIAGNOSIS — E1165 Type 2 diabetes mellitus with hyperglycemia: Secondary | ICD-10-CM

## 2020-11-30 DIAGNOSIS — G4730 Sleep apnea, unspecified: Secondary | ICD-10-CM

## 2020-11-30 DIAGNOSIS — M25552 Pain in left hip: Secondary | ICD-10-CM | POA: Diagnosis not present

## 2020-11-30 DIAGNOSIS — I10 Essential (primary) hypertension: Secondary | ICD-10-CM | POA: Diagnosis not present

## 2020-11-30 DIAGNOSIS — Z6832 Body mass index (BMI) 32.0-32.9, adult: Secondary | ICD-10-CM

## 2020-11-30 DIAGNOSIS — E6609 Other obesity due to excess calories: Secondary | ICD-10-CM

## 2020-11-30 DIAGNOSIS — S39012A Strain of muscle, fascia and tendon of lower back, initial encounter: Secondary | ICD-10-CM

## 2020-11-30 LAB — COMPREHENSIVE METABOLIC PANEL WITH GFR
ALT: 11 U/L (ref 0–35)
AST: 15 U/L (ref 0–37)
Albumin: 4.2 g/dL (ref 3.5–5.2)
Alkaline Phosphatase: 67 U/L (ref 39–117)
BUN: 10 mg/dL (ref 6–23)
CO2: 27 meq/L (ref 19–32)
Calcium: 9.8 mg/dL (ref 8.4–10.5)
Chloride: 100 meq/L (ref 96–112)
Creatinine, Ser: 0.71 mg/dL (ref 0.40–1.20)
GFR: 87.4 mL/min
Glucose, Bld: 247 mg/dL — ABNORMAL HIGH (ref 70–99)
Potassium: 4 meq/L (ref 3.5–5.1)
Sodium: 135 meq/L (ref 135–145)
Total Bilirubin: 0.5 mg/dL (ref 0.2–1.2)
Total Protein: 7.1 g/dL (ref 6.0–8.3)

## 2020-11-30 LAB — LIPID PANEL
Cholesterol: 254 mg/dL — ABNORMAL HIGH (ref 0–200)
HDL: 49.7 mg/dL
NonHDL: 204.02
Total CHOL/HDL Ratio: 5
Triglycerides: 282 mg/dL — ABNORMAL HIGH (ref 0.0–149.0)
VLDL: 56.4 mg/dL — ABNORMAL HIGH (ref 0.0–40.0)

## 2020-11-30 LAB — LDL CHOLESTEROL, DIRECT: Direct LDL: 163 mg/dL

## 2020-11-30 LAB — MICROALBUMIN / CREATININE URINE RATIO
Creatinine,U: 120.6 mg/dL
Microalb Creat Ratio: 1.6 mg/g (ref 0.0–30.0)
Microalb, Ur: 1.9 mg/dL (ref 0.0–1.9)

## 2020-11-30 LAB — HEMOGLOBIN A1C: Hgb A1c MFr Bld: 10.9 % — ABNORMAL HIGH (ref 4.6–6.5)

## 2020-11-30 MED ORDER — TRAMADOL HCL 50 MG PO TABS
50.0000 mg | ORAL_TABLET | Freq: Two times a day (BID) | ORAL | 0 refills | Status: DC | PRN
  Filled 2020-11-30: qty 30, 15d supply, fill #0

## 2020-11-30 MED ORDER — GLIPIZIDE ER 5 MG PO TB24
ORAL_TABLET | ORAL | 11 refills | Status: DC
  Filled 2020-11-30: qty 60, 30d supply, fill #0
  Filled 2021-01-10: qty 60, 30d supply, fill #1
  Filled 2021-02-13: qty 60, 30d supply, fill #2
  Filled 2021-03-22: qty 60, 30d supply, fill #3
  Filled 2021-04-29: qty 60, 30d supply, fill #4
  Filled 2021-06-08: qty 60, 30d supply, fill #5
  Filled 2021-07-10: qty 60, 30d supply, fill #6
  Filled 2021-08-10: qty 60, 30d supply, fill #7
  Filled 2021-09-14: qty 60, 30d supply, fill #8
  Filled 2021-10-18: qty 60, 30d supply, fill #9

## 2020-11-30 NOTE — Progress Notes (Signed)
Patient ID: [name] Strickland, female    DOB: [birth year], 70 y.o.   MRN: [id number]  This visit was conducted in person.  BP 138/82   Pulse 65   Temp (!) 97.3 F (36.3 C) (Temporal)   Ht 5' 7.75" (1.721 m)   Wt 223 lb (101.2 kg)   SpO2 98%   BMI 34.16 kg/m    CC:  Chief Complaint  Patient presents with   Transitions Of Care    Subjective:   HPI: [name] Strickland is a 70 y.o. female presenting on 11/30/2020 for Transitions Of Care  Last CPX years.  Previous PCP Dr. Dayton Martes  Cardiology Dr. Ladona Ridgel  2012.Marland Kitchen dx with atrial fibrillation  Treated with flecanide, cardizem and  pradaxa.  Ablation in 2016 Dr. Johney Frame. She felt worse after this.  Has been followed by Dr. Ladona Ridgel now.   In last year she has had 1-2 episodes a month of afib.. takes an extra flecanide per Dr. Ladona Ridgel and reverts to sinus.  Has been more frequent in last year given increase in stress with move etc.  Has follow up in 03/2021  Had chemical stress test... felt ill 2012 nml.  2017 exercsie stress test low risk  2012ECHO: 55-80% EF.   She has been working on weight loss.. trying to decrease carbs. Wt Readings from Last 3 Encounters:  11/30/20 223 lb (101.2 kg)  11/09/20 222 lb (100.7 kg)  03/01/20 231 lb 9.6 oz (105.1 kg)   ? HTN  BP Readings from Last 3 Encounters:  11/30/20 138/82  11/09/20 135/78  03/01/20 122/78   Hx of chronic back pain.Marland Kitchen sciatica in past.   No falls but she has been moving.   Now in last 3 weeks pain started to flare.. feels different then in  past when it was in left buttock.  Describes instead a sharp shooting pain when walking, getting off commode, getting out of car... sharp zing pain in left lateral, anterior hip.. radiating to left upper tigh.  We tried treatment  in past with prednsione .. this helped.   She is now using cyclobenzaprine and tramadol off and on for pain.   This has helped slightly.    Recent ER visit  for  headache and tingling.  Dx with DM.Marland Kitchen started on  metfomrin.. could not tolerate given had SE of  "uneasiness" in head and diarrhea Cbc nml  CMET normal except hyperglycemia, no sign of DKA. CBG 22-276 Troponin nml  EKG unchanged    She has been checking FBS 200-250. Despite avoiding desserts.. changed to coke zero. Has decreased potatos.  Had been walking... but has not been able to lately.  PDMP reviewed during this encounter.       Relevant past medical, surgical, family and social history reviewed and updated as indicated. Interim medical history since our last visit reviewed. Allergies and medications reviewed and updated. Outpatient Medications Prior to Visit  Medication Sig Dispense Refill   acetaminophen (TYLENOL) 650 MG CR tablet Take 650 mg by mouth at bedtime as needed for pain.     cyclobenzaprine (FLEXERIL) 10 MG tablet Take 0.5-1 tablets (5-10 mg total) by mouth at bedtime as needed for muscle spasms. 30 tablet 0   diltiazem (CARDIZEM CD) 120 MG 24 hr capsule TAKE 1 CAPSULE BY MOUTH DAILY. (Patient taking differently: Take 120 mg by mouth daily.) 90 capsule 2   flecainide (TAMBOCOR) 100 MG tablet TAKE 1 TABLET BY MOUTH 2 TIMES DAILY. (Patient taking differently: Take  100 mg by mouth 2 (two) times daily.) 180 tablet 1   metFORMIN (GLUCOPHAGE) 500 MG tablet Take 1 tablet (500 mg total) by mouth 2 (two) times daily with a meal. 60 tablet 0   [name] 150 MG CAPS capsule TAKE 1 CAPSULE BY MOUTH TWICE A DAY 60 capsule 5   traMADol (ULTRAM) 50 MG tablet Take 50 mg by mouth every 8 (eight) hours as needed.     pantoprazole (PROTONIX) 40 MG tablet Take 1 tablet (40 mg total) by mouth daily. (Patient not taking: Reported on 11/30/2020) 90 tablet 0   No facility-administered medications prior to visit.     Per HPI unless specifically indicated in ROS section below Review of Systems  Constitutional:  Negative for fatigue and fever.  HENT:  Negative for congestion.   Eyes:  Negative for pain.  Respiratory:  Negative for cough and  shortness of breath.   Cardiovascular:  Negative for chest pain, palpitations and leg swelling.  Gastrointestinal:  Negative for abdominal pain.  Genitourinary:  Negative for dysuria and vaginal bleeding.  Musculoskeletal:  Positive for back pain.  Neurological:  Negative for syncope, light-headedness and headaches.  Psychiatric/Behavioral:  Negative for dysphoric mood.   Objective:  BP 138/82   Pulse 65   Temp (!) 97.3 F (36.3 C) (Temporal)   Ht 5' 7.75" (1.721 m)   Wt 223 lb (101.2 kg)   SpO2 98%   BMI 34.16 kg/m   Wt Readings from Last 3 Encounters:  11/30/20 223 lb (101.2 kg)  11/09/20 222 lb (100.7 kg)  03/01/20 231 lb 9.6 oz (105.1 kg)      Physical Exam Constitutional:      General: She is not in acute distress.    Appearance: Normal appearance. She is well-developed. She is obese. She is not ill-appearing or toxic-appearing.  HENT:     Head: Normocephalic.     Right Ear: Hearing, tympanic membrane, ear canal and external ear normal. Tympanic membrane is not erythematous, retracted or bulging.     Left Ear: Hearing, tympanic membrane, ear canal and external ear normal. Tympanic membrane is not erythematous, retracted or bulging.     Nose: No mucosal edema or rhinorrhea.     Right Sinus: No maxillary sinus tenderness or frontal sinus tenderness.     Left Sinus: No maxillary sinus tenderness or frontal sinus tenderness.     Mouth/Throat:     Pharynx: Uvula midline.  Eyes:     General: Lids are normal. Lids are everted, no foreign bodies appreciated.     Conjunctiva/sclera: Conjunctivae normal.     Pupils: Pupils are equal, round, and reactive to light.  Neck:     Thyroid: No thyroid mass or thyromegaly.     Vascular: No carotid bruit.     Trachea: Trachea normal.  Cardiovascular:     Rate and Rhythm: Normal rate and regular rhythm.     Pulses: Normal pulses.     Heart sounds: Normal heart sounds, S1 normal and S2 normal. No murmur heard.   No friction rub. No  gallop.  Pulmonary:     Effort: Pulmonary effort is normal. No tachypnea or respiratory distress.     Breath sounds: Normal breath sounds. No decreased breath sounds, wheezing, rhonchi or rales.  Abdominal:     General: Bowel sounds are normal.     Palpations: Abdomen is soft.     Tenderness: There is no abdominal tenderness.  Musculoskeletal:     Cervical back: Normal, normal  range of motion and neck supple.     Thoracic back: Normal.     Lumbar back: Spasms and tenderness present. Decreased range of motion. Positive left straight leg raise test.     Left hip: Tenderness present. No bony tenderness. Decreased range of motion. Normal strength.  Skin:    General: Skin is warm and dry.     Findings: No rash.  Neurological:     Mental Status: She is alert.  Psychiatric:        Mood and Affect: Mood is not anxious or depressed.        Speech: Speech normal.        Behavior: Behavior normal. Behavior is cooperative.        Thought Content: Thought content normal.        Judgment: Judgment normal.      Results for orders placed or performed during the hospital encounter of 11/09/20  Comprehensive metabolic panel  Result Value Ref Range   Sodium 136 135 - 145 mmol/L   Potassium 4.0 3.5 - 5.1 mmol/L   Chloride 101 98 - 111 mmol/L   CO2 26 22 - 32 mmol/L   Glucose, Bld 295 (H) 70 - 99 mg/dL   BUN 5 (L) 8 - 23 mg/dL   Creatinine, Ser 2.37 0.44 - 1.00 mg/dL   Calcium 9.0 8.9 - 62.8 mg/dL   Total Protein 6.1 (L) 6.5 - 8.1 g/dL   Albumin 3.3 (L) 3.5 - 5.0 g/dL   AST 15 15 - 41 U/L   ALT 13 0 - 44 U/L   Alkaline Phosphatase 63 38 - 126 U/L   Total Bilirubin 0.7 0.3 - 1.2 mg/dL   GFR, Estimated >31 >51 mL/min   Anion gap 9 5 - 15  CBC with Differential  Result Value Ref Range   WBC 6.6 4.0 - 10.5 K/uL   RBC 5.02 3.87 - 5.11 MIL/uL   Hemoglobin 14.1 12.0 - 15.0 g/dL   HCT 76.1 60.7 - 37.1 %   MCV 83.3 80.0 - 100.0 fL   MCH 28.1 26.0 - 34.0 pg   MCHC 33.7 30.0 - 36.0 g/dL   RDW  06.2 69.4 - 85.4 %   Platelets 292 150 - 400 K/uL   nRBC 0.0 0.0 - 0.2 %   Neutrophils Relative % 64 %   Neutro Abs 4.3 1.7 - 7.7 K/uL   Lymphocytes Relative 26 %   Lymphs Abs 1.7 0.7 - 4.0 K/uL   Monocytes Relative 6 %   Monocytes Absolute 0.4 0.1 - 1.0 K/uL   Eosinophils Relative 3 %   Eosinophils Absolute 0.2 0.0 - 0.5 K/uL   Basophils Relative 1 %   Basophils Absolute 0.0 0.0 - 0.1 K/uL   Immature Granulocytes 0 %   Abs Immature Granulocytes 0.02 0.00 - 0.07 K/uL  Protime-INR  Result Value Ref Range   Prothrombin Time 17.6 (H) 11.4 - 15.2 seconds   INR 1.5 (H) 0.8 - 1.2  CBG monitoring, ED  Result Value Ref Range   Glucose-Capillary 276 (H) 70 - 99 mg/dL  CBG monitoring, ED  Result Value Ref Range   Glucose-Capillary 222 (H) 70 - 99 mg/dL  Troponin I (High Sensitivity)  Result Value Ref Range   Troponin I (High Sensitivity) 16 <18 ng/L  Troponin I (High Sensitivity)  Result Value Ref Range   Troponin I (High Sensitivity) 16 <18 ng/L    This visit occurred during the SARS-CoV-2 public health emergency.  Safety protocols  were in place, including screening questions prior to the visit, additional usage of staff PPE, and extensive cleaning of exam room while observing appropriate contact time as indicated for disinfecting solutions.   COVID 19 screen:  No recent travel or known exposure to COVID19 The patient denies respiratory symptoms of COVID 19 at this time. The importance of social distancing was discussed today.   Assessment and Plan Problem List Items Addressed This Visit     Atrial fibrillation (HCC)    2012.Marland Kitchen. dx with atrial fibrillation  Treated with flecanide, cardizem and  pradaxa.  Ablation in 2016 Dr. Johney FrameAllred. She felt worse after this.  Has been followed by Dr. Ladona Ridgelaylor now.      Class 1 obesity with serious comorbidity and body mass index (BMI) of 32.0 to 32.9 in adult    Body mass index is 34.16 kg/m.        Relevant Medications   glipiZIDE  (GLUCOTROL XL) 5 MG 24 hr tablet   Essential hypertension    Stable, chronic.  Continue current medication.   Diltiazem 120 mg daily      Low back strain, initial encounter     Pain soource hip vs low back. Stop cyclobenzaprine.  Use tramadol for pain as needed... limit as much as able  Set up left hip x-ray.  Will try to avoid prednisone as poorly controlled DM ongoing at this point.      Sleep apnea    Never proven.. today per pt  No snoring, no apneic spells. She does have daytime fatigue.      Type 2 diabetes mellitus with hyperglycemia, without long-term current use of insulin (HCC) - Primary    Chronic, inadequate control.  Start glipizide one daily increase to 2 if able.  Bring in  FBS and 2 hours after meals measurements in 1-2 weeks.      Relevant Medications   glipiZIDE (GLUCOTROL XL) 5 MG 24 hr tablet   Other Relevant Orders   Hemoglobin A1c (Completed)   Lipid panel (Completed)   Comprehensive metabolic panel (Completed)   Microalbumin / creatinine urine ratio (Completed)   Other Visit Diagnoses     Left hip pain       Relevant Orders   DG Hip Unilat W OR W/O Pelvis 2-3 Views Left       Meds ordered this encounter  Medications   glipiZIDE (GLUCOTROL XL) 5 MG 24 hr tablet    Sig: One daily, if tolerate and FBS not at goal  increase to 2 daily.    Dispense:  60 tablet    Refill:  11   traMADol (ULTRAM) 50 MG tablet    Sig: Take 1 tablet (50 mg total) by mouth every 12 (twelve) hours as needed.    Dispense:  30 tablet    Refill:  0   Orders Placed This Encounter  Procedures   DG Hip Unilat W OR W/O Pelvis 2-3 Views Left    Standing Status:   Future    Standing Expiration Date:   11/30/2021    Order Specific Question:   Reason for Exam (SYMPTOM  OR DIAGNOSIS REQUIRED)    Answer:   left hip pain    Order Specific Question:   Preferred imaging location?    Answer:   [facility]   Hemoglobin A1c   Lipid panel   Comprehensive metabolic panel    Microalbumin / creatinine urine ratio   LDL cholesterol, direct       [name] Strickland  Diona Browner, MD

## 2020-11-30 NOTE — Patient Instructions (Addendum)
Get back on track with regular exercise... walking 3-5 days a week.  Please stop at the lab to have labs drawn.   Start glipizide one daily increase to 2 if able.  Bring in  FBS and 2 hours after meals measurements in 1-2 weeks.  Stop cyclobnzaprine.  Use tramadol for pain as needed... limit as much as able  Set up left hip x-ray.

## 2020-11-30 NOTE — Assessment & Plan Note (Signed)
Body mass index is 34.16 kg/m.

## 2020-11-30 NOTE — Assessment & Plan Note (Signed)
Stable, chronic.  Continue current medication.   Diltiazem 120 mg daily

## 2020-11-30 NOTE — Assessment & Plan Note (Signed)
Never proven.. today per pt  No snoring, no apneic spells. She does have daytime fatigue.

## 2020-11-30 NOTE — Assessment & Plan Note (Signed)
2012.. dx with atrial fibrillation  Treated with flecanide, cardizem and  pradaxa.  Ablation in 2016 Dr. Johney Frame. She felt worse after this.  Has been followed by Dr. Ladona Ridgel now.

## 2020-12-31 NOTE — Assessment & Plan Note (Signed)
Chronic, inadequate control.  Start glipizide one daily increase to 2 if able.  Bring in  FBS and 2 hours after meals measurements in 1-2 weeks.

## 2020-12-31 NOTE — Assessment & Plan Note (Addendum)
Pain soource hip vs low back. Stop cyclobenzaprine.  Use tramadol for pain as needed... limit as much as able  Set up left hip x-ray.  Will try to avoid prednisone as poorly controlled DM ongoing at this point.

## 2021-01-10 ENCOUNTER — Other Ambulatory Visit: Payer: Self-pay | Admitting: Internal Medicine

## 2021-01-10 ENCOUNTER — Other Ambulatory Visit: Payer: Self-pay

## 2021-01-10 MED FILL — Diltiazem HCl Coated Beads Cap ER 24HR 120 MG: ORAL | 90 days supply | Qty: 90 | Fill #1 | Status: AC

## 2021-01-10 MED FILL — Flecainide Acetate Tab 100 MG: ORAL | 90 days supply | Qty: 180 | Fill #0 | Status: AC

## 2021-02-13 ENCOUNTER — Other Ambulatory Visit: Payer: Self-pay

## 2021-02-14 ENCOUNTER — Other Ambulatory Visit: Payer: Self-pay

## 2021-02-20 ENCOUNTER — Other Ambulatory Visit: Payer: Self-pay

## 2021-03-22 MED FILL — Diltiazem HCl Coated Beads Cap ER 24HR 120 MG: ORAL | 90 days supply | Qty: 90 | Fill #2 | Status: AC

## 2021-03-23 ENCOUNTER — Other Ambulatory Visit: Payer: Self-pay

## 2021-03-27 ENCOUNTER — Other Ambulatory Visit: Payer: Self-pay

## 2021-04-26 ENCOUNTER — Ambulatory Visit: Payer: 59 | Admitting: *Deleted

## 2021-04-26 ENCOUNTER — Other Ambulatory Visit (INDEPENDENT_AMBULATORY_CARE_PROVIDER_SITE_OTHER): Payer: 59 | Admitting: Internal Medicine

## 2021-04-26 VITALS — BP 178/84 | HR 95 | Resp 20

## 2021-04-26 DIAGNOSIS — I4891 Unspecified atrial fibrillation: Secondary | ICD-10-CM | POA: Diagnosis not present

## 2021-04-26 DIAGNOSIS — R42 Dizziness and giddiness: Secondary | ICD-10-CM

## 2021-04-26 NOTE — Progress Notes (Signed)
Noted  

## 2021-04-26 NOTE — Progress Notes (Signed)
Patient at work in office experienced Near syncope event witnessed by front staff, ask Patient to describe what happen she stated she turned in chair to get up to restroom and became very dizzy and felt faint. Patient advised nurse on 04/25/21 she had felt and knew she was experiencing Afib, patient current rhythm on auscultation by nurse was regular with no missed or skipped beats, and rate was at that time 78 . POCT CBG attained  243, patient has not taken glipizide due to not ate today. But patient reported taking flecainide and Cardizem, this morning and extra flecainide on 04/25/21 for Afib.  Consulted MD within office was advised to call cardiologist and attain there advice called spoke with Dr. Ronney [facility] was advised EKG was comparable to last attained EKG and their recommendation was to have patient call and schedule follow up ASAP. To go home rest and drink plenty of fluids and if worsening symptoms call their office or seek immediate medical attention. Last recorded BP 150/78 pulse 58. Daughter arrived to drive patient home walked to car with out assistance.

## 2021-04-30 ENCOUNTER — Other Ambulatory Visit: Payer: Self-pay

## 2021-05-01 ENCOUNTER — Other Ambulatory Visit: Payer: Self-pay

## 2021-05-03 ENCOUNTER — Telehealth: Payer: Self-pay

## 2021-05-03 NOTE — Telephone Encounter (Signed)
**Note De-Identified [name] Strickland Obfuscation** I started a [name] PA through covermymeds. Key: [id number]

## 2021-05-04 ENCOUNTER — Other Ambulatory Visit: Payer: Self-pay | Admitting: Pharmacist

## 2021-05-04 ENCOUNTER — Other Ambulatory Visit: Payer: Self-pay

## 2021-05-04 MED ORDER — DABIGATRAN ETEXILATE MESYLATE 150 MG PO CAPS
150.0000 mg | ORAL_CAPSULE | Freq: Two times a day (BID) | ORAL | 5 refills | Status: DC
  Filled 2021-05-04: qty 60, 30d supply, fill #0
  Filled 2021-10-18: qty 60, 30d supply, fill #1
  Filled 2022-03-13: qty 60, 30d supply, fill #2

## 2021-05-07 NOTE — Telephone Encounter (Signed)
**Note De-Identified Johonna Binette Obfuscation** We received a form Ambar Raphael fax from [facility] concerning the pts [name] PA. The form only has 1 question which is "has the pt had a trial of Eliquis or Xarelto?" and gives me the option to call and answer the question or complete the form and fax it back.   I called [phone] and s/w Anticia. I advised An[facility] that the pt has never had a trial of Eliquis or Xarelto.   I requested that she add that the pt has been taking [name] since 01/16/2011 with good results and that Dr Lovena Le does not want to interrupt her therapy at this point.  Also, that they have been covering the pts [name] for more than 10 years now and that we are requesting that they continue.   Per [name] she is sending to the PA Dept and that it can take up to 72 hours for their determination Ahmani Daoud fax.

## 2021-05-10 NOTE — Telephone Encounter (Signed)
**Note De-Identified [name] Strickland Obfuscation** Letter received from [facility] stating that they have denied the pts [name] PA.  This is their second denial as I did appeal the 1st denial and included the following as reason for appeal: The pt has been taking [name] since 01/16/2011 with good results, that Dr Lovena Le does not want to interrupt her therapy at this point, and that they have been covering the pts [name] for more than 10 years now and that we are requesting that they continue.  Reason for denial: Pt must try and fail 2 generic equivalents. The letter states that we can appeal again but that a statement from Dr Lovena Le explaining why the pt must take [name] is required.[name] Strickland this message to Dr Lovena Le for advisement/statement explaining why pt must take [name].

## 2021-05-17 NOTE — Telephone Encounter (Signed)
° °  Pt is returning call, she said she picked up the pradaxa generic today at the pharmacy and the pharmacist told her it was approved by Dr. Lovena Le, she said, since she is meeting with PA tomorrow she will just discuss it with him

## 2021-05-17 NOTE — Telephone Encounter (Signed)
Per Dr. Hedwig Morton recommends Pt try Eliquis 5 mg PO BID.  Left detailed message for Pt advising of above.  Requested call back.

## 2021-05-18 ENCOUNTER — Other Ambulatory Visit: Payer: Self-pay

## 2021-05-18 ENCOUNTER — Ambulatory Visit: Payer: 59 | Admitting: Student

## 2021-05-18 VITALS — BP 132/76 | HR 71 | Ht 68.0 in | Wt 243.6 lb

## 2021-05-18 DIAGNOSIS — I10 Essential (primary) hypertension: Secondary | ICD-10-CM | POA: Diagnosis not present

## 2021-05-18 DIAGNOSIS — I480 Paroxysmal atrial fibrillation: Secondary | ICD-10-CM | POA: Diagnosis not present

## 2021-05-18 NOTE — Progress Notes (Signed)
PCP:  [name], MD Primary Cardiologist: None Electrophysiologist: Lewayne Bunting, MD   [name] Strickland is a 71 y.o. female seen today for Lewayne Bunting, MD for routine electrophysiology followup.  Since last being seen in our clinic the patient reports doing overall doing well. She did have a breakthrough episode of atrial fibrillation several weeks ago, but otherwise has been doing fine. Her husband has passed away in the past year, so a lot of stress still going on with that. She is also displaced from her usual work office due to mold. Overall, she is satisfied with her control and management. She was able to get generic pradaxa covered by insurance.   Past Medical History:  Diagnosis Date   Diabetes mellitus    pt is unaware and is on no medicine for this (A1C 5.9 2012)   Dyslipidemia    Hypertension    Overweight    Paroxysmal atrial fibrillation (HCC)    chads2vasc score of at least 2   Snoring    cancelled prior sleep study due to costs   Typical atrial flutter [facility])    Past Surgical History:  Procedure Laterality Date   APPENDECTOMY     ATRIAL FIBRILLATION ABLATION N/A 07/26/2014   Procedure: ATRIAL FIBRILLATION ABLATION;  Surgeon: [name], MD;  Location: [facility] CATH LAB;  Service: Cardiovascular;  Laterality: N/A;   BREAST LUMPECTOMY     benign   CARDIOVERSION N/A 07/03/2014   Procedure: CARDIOVERSION;  Surgeon: Luis Abed, MD;  Location[facility] OR;  Service: Cardiovascular;  Laterality: N/A;   KNEE SURGERY     REPLACEMENT TOTAL KNEE     bilateral, different times   TEE WITHOUT CARDIOVERSION N/A 07/26/2014   Procedure: TRANSESOPHAGEAL ECHOCARDIOGRAM (TEE);  Surgeon: Lewayne Bunting, MD;  Location: [facility] ENDOSCOPY;  Service: Cardiovascular;  Laterality: N/A;   TONSILLECTOMY AND ADENOIDECTOMY      Current Outpatient Medications  Medication Sig Dispense Refill   acetaminophen (TYLENOL) 650 MG CR tablet Take 650 mg by mouth at bedtime as needed for pain.      cyclobenzaprine (FLEXERIL) 10 MG tablet Take 0.5-1 tablets (5-10 mg total) by mouth at bedtime as needed for muscle spasms. 30 tablet 0   dabigatran ([name]) 150 MG CAPS capsule Take 1 capsule (150 mg total) by mouth 2 (two) times daily. 60 capsule 5   diltiazem (CARDIZEM CD) 120 MG 24 hr capsule TAKE 1 CAPSULE BY MOUTH DAILY. (Patient taking differently: Take 120 mg by mouth daily.) 90 capsule 2   flecainide (TAMBOCOR) 100 MG tablet TAKE 1 TABLET BY MOUTH 2 TIMES DAILY. 180 tablet 0   glipiZIDE (GLUCOTROL XL) 5 MG 24 hr tablet One daily, if tolerate and FBS not at goal  increase to 2 daily. 60 tablet 11   traMADol (ULTRAM) 50 MG tablet Take 1 tablet (50 mg total) by mouth every 12 (twelve) hours as needed. 30 tablet 0   No current facility-administered medications for this visit.    Allergies  Allergen Reactions   Shellfish-Derived Products Itching and Swelling        Fish Allergy Itching and Swelling   Iodine Other (See Comments)    Unknown childhood reaction to topical iodine   Contrast Media [Iodinated Contrast Media] Other (See Comments)    Unknown reaction to topical iodine as a child   Sulfonamide Derivatives Itching and Rash    Social History   Socioeconomic History   Marital status: Widowed    Spouse name: Not on  file   Number of children: Not on file   Years of education: Not on file   Highest education level: Not on file  Occupational History   Not on file  Tobacco Use   Smoking status: Never   Smokeless tobacco: Never  Vaping Use   Vaping Use: Never used  Substance and Sexual Activity   Alcohol use: No   Drug use: No   Sexual activity: Not on file  Other Topics Concern   Not on file  Social History Narrative   Pt lives in [place] with husband daughter.  Her husband is chronically ill with CHF and morbid obesity (420 lbs). She provides total care for him.   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   Social Determinants of Health   Financial  Resource Strain: Not on file  Food Insecurity: Not on file  Transportation Needs: Not on file  Physical Activity: Not on file  Stress: Not on file  Social Connections: Not on file  Intimate Partner Violence: Not on file     Review of Systems: All other systems reviewed and are otherwise negative except as noted above.  Physical Exam: Vitals:   05/18/21 1159  BP: 132/76  Pulse: 71  SpO2: 95%  Weight: 243 lb 9.6 oz (110.5 kg)  Height: 5\' 8"  (1.727 m)    GEN- The patient is well appearing, alert and oriented x 3 today.   HEENT: normocephalic, atraumatic; sclera clear, conjunctiva pink; hearing intact; oropharynx clear; neck supple, no JVP Lymph- no cervical lymphadenopathy Lungs- Clear to ausculation bilaterally, normal work of breathing.  No wheezes, rales, rhonchi Heart- Regular rate and rhythm, no murmurs, rubs or gallops, PMI not laterally displaced GI- soft, non-tender, non-distended, bowel sounds present, no hepatosplenomegaly Extremities- no clubbing, cyanosis, or edema; DP/PT/radial pulses 2+ bilaterally MS- no significant deformity or atrophy Skin- warm and dry, no rash or lesion Psych- euthymic mood, full affect Neuro- strength and sensation are intact  EKG is not ordered today, EKG 04/26/2021 showed sinus bradycardia with overall stable intervals.  Additional studies reviewed include: Previous EP office notes.   Assessment and Plan:  Paroxysmal AF Mostly maintaining NSR Intervals stable on recent EKG Offered to update her ETT and Echo, or either; she prefers to defer both with no symptoms.   2. Obesity Body mass index is 37.04 kg/m.  Increase activity as tolerated   3. Coags  No bleeding Generic pradax is covered by her insurance.   04/28/2021 [address]" [place], [place]  05/18/2021 12:31 PM

## 2021-05-18 NOTE — Patient Instructions (Signed)
Medication Instructions:  Your physician recommends that you continue on your current medications as directed. Please refer to the Current Medication list given to you today.  *If you need a refill on your cardiac medications before your next appointment, please call your pharmacy*   Lab Work: None If you have labs (blood work) drawn today and your tests are completely normal, you will receive your results only by: MyChart Message (if you have MyChart) OR A paper copy in the mail If you have any lab test that is abnormal or we need to change your treatment, we will call you to review the results.   Follow-Up: At [facility], you and your health needs are our priority.  As part of our continuing mission to provide you with exceptional heart care, we have created designated Provider Care Teams.  These Care Teams include your primary Cardiologist (physician) and Advanced Practice Providers (APPs -  Physician Assistants and Nurse Practitioners) who all work together to provide you with the care you need, when you need it.   Your next appointment:   6 month(s)  The format for your next appointment:   In Person  Provider:   You may see Gregg Taylor, MD or one of the following Advanced Practice Providers on your designated Care Team:   [name] Ursuy, PA-C Michael "Andy" Tillery, PA-C    

## 2021-05-22 NOTE — Telephone Encounter (Signed)
If she would like to switch away from [name] if too expensive, just let me know and we can switch to eliquis, xarelto or coumadin. GT

## 2021-05-23 NOTE — Telephone Encounter (Signed)
Issue has been resolved.  Her insurance is covering generic [name].  No action needed.

## 2021-06-08 ENCOUNTER — Other Ambulatory Visit: Payer: Self-pay

## 2021-07-10 ENCOUNTER — Other Ambulatory Visit: Payer: Self-pay

## 2021-08-03 ENCOUNTER — Other Ambulatory Visit: Payer: Self-pay

## 2021-08-03 ENCOUNTER — Other Ambulatory Visit: Payer: Self-pay | Admitting: *Deleted

## 2021-08-03 MED ORDER — TRAMADOL HCL 50 MG PO TABS
50.0000 mg | ORAL_TABLET | Freq: Two times a day (BID) | ORAL | 0 refills | Status: DC | PRN
  Filled 2021-08-03: qty 30, 15d supply, fill #0

## 2021-08-03 NOTE — Telephone Encounter (Signed)
PDMP reviewed during this encounter. ?No red flags ? ?

## 2021-08-03 NOTE — Telephone Encounter (Signed)
Patient notified as instructed. 

## 2021-08-03 NOTE — Telephone Encounter (Signed)
[name] Strickland feels that she is having sciatica. Pain starting in the buttocks area and runs down her leg.  States she doesn't have money for a co pay to be seen but is asking if Dr. Ermalene Searing would refill her Tramadol.  She states she still has some cyclobenzaprine.  Please advise.  ?

## 2021-08-10 ENCOUNTER — Other Ambulatory Visit: Payer: Self-pay

## 2021-08-10 DIAGNOSIS — Z0279 Encounter for issue of other medical certificate: Secondary | ICD-10-CM

## 2021-08-17 ENCOUNTER — Telehealth: Payer: Self-pay

## 2021-08-17 NOTE — Telephone Encounter (Signed)
Request received to renew FMLA paperwork.  Payment received.  Will process request.

## 2021-08-17 NOTE — Telephone Encounter (Signed)
Completed form scanned, will be faxed & placed in outgoing mail by EOD today

## 2021-08-21 ENCOUNTER — Other Ambulatory Visit: Payer: Self-pay

## 2021-09-14 ENCOUNTER — Other Ambulatory Visit: Payer: Self-pay

## 2021-10-18 ENCOUNTER — Other Ambulatory Visit: Payer: Self-pay | Admitting: Internal Medicine

## 2021-10-18 ENCOUNTER — Other Ambulatory Visit: Payer: Self-pay

## 2021-10-19 ENCOUNTER — Other Ambulatory Visit: Payer: Self-pay

## 2021-10-19 ENCOUNTER — Other Ambulatory Visit: Payer: Self-pay | Admitting: Internal Medicine

## 2021-10-19 MED FILL — Diltiazem HCl Coated Beads Cap ER 24HR 120 MG: ORAL | 90 days supply | Qty: 90 | Fill #0 | Status: AC

## 2021-10-19 MED FILL — Flecainide Acetate Tab 100 MG: ORAL | 90 days supply | Qty: 180 | Fill #0 | Status: AC

## 2021-10-20 ENCOUNTER — Other Ambulatory Visit: Payer: Self-pay

## 2021-10-22 ENCOUNTER — Other Ambulatory Visit: Payer: Self-pay

## 2021-11-20 ENCOUNTER — Telehealth: Payer: Self-pay | Admitting: Family Medicine

## 2021-11-20 DIAGNOSIS — Z1159 Encounter for screening for other viral diseases: Secondary | ICD-10-CM

## 2021-11-20 DIAGNOSIS — E1165 Type 2 diabetes mellitus with hyperglycemia: Secondary | ICD-10-CM

## 2021-11-20 NOTE — Telephone Encounter (Signed)
-----   Message from Alvina Chou sent at 11/08/2021  3:44 PM EDT ----- Regarding: Lab orders for Wednesday, 8.23.23 Patient is scheduled for CPX labs, please order future labs, Thanks , Terri   Wants to check a1c, also

## 2021-11-21 ENCOUNTER — Other Ambulatory Visit (INDEPENDENT_AMBULATORY_CARE_PROVIDER_SITE_OTHER): Payer: 59

## 2021-11-21 DIAGNOSIS — Z1159 Encounter for screening for other viral diseases: Secondary | ICD-10-CM

## 2021-11-21 DIAGNOSIS — E1165 Type 2 diabetes mellitus with hyperglycemia: Secondary | ICD-10-CM | POA: Diagnosis not present

## 2021-11-21 LAB — COMPREHENSIVE METABOLIC PANEL
ALT: 13 U/L (ref 0–35)
AST: 14 U/L (ref 0–37)
Albumin: 4 g/dL (ref 3.5–5.2)
Alkaline Phosphatase: 83 U/L (ref 39–117)
BUN: 11 mg/dL (ref 6–23)
CO2: 29 mEq/L (ref 19–32)
Calcium: 9.3 mg/dL (ref 8.4–10.5)
Chloride: 100 mEq/L (ref 96–112)
Creatinine, Ser: 0.75 mg/dL (ref 0.40–1.20)
GFR: 81.28 mL/min (ref >60.00)
Glucose, Bld: 236 mg/dL — ABNORMAL HIGH (ref 70–99)
Potassium: 4.6 mEq/L (ref 3.5–5.1)
Sodium: 138 mEq/L (ref 135–145)
Total Bilirubin: 0.6 mg/dL (ref 0.2–1.2)
Total Protein: 7 g/dL (ref 6.0–8.3)

## 2021-11-21 LAB — LIPID PANEL
Cholesterol: 230 mg/dL — ABNORMAL HIGH (ref 0–200)
HDL: 56.1 mg/dL (ref >39.00)
NonHDL: 173.92
Total CHOL/HDL Ratio: 4
Triglycerides: 292 mg/dL — ABNORMAL HIGH (ref 0.0–149.0)
VLDL: 58.4 mg/dL — ABNORMAL HIGH (ref 0.0–40.0)

## 2021-11-21 LAB — MICROALBUMIN / CREATININE URINE RATIO
Creatinine,U: 62.3 mg/dL
Microalb Creat Ratio: 1.1 mg/g (ref 0.0–30.0)
Microalb, Ur: <0.7 mg/dL (ref 0.0–1.9)

## 2021-11-21 LAB — LDL CHOLESTEROL, DIRECT: Direct LDL: 139 mg/dL

## 2021-11-21 LAB — HEMOGLOBIN A1C: Hgb A1c MFr Bld: 8.4 % — ABNORMAL HIGH (ref 4.6–6.5)

## 2021-11-22 LAB — HEPATITIS C ANTIBODY: Hepatitis C Ab: NONREACTIVE

## 2021-11-22 NOTE — Progress Notes (Signed)
No critical labs need to be addressed urgently. We will discuss labs in detail at upcoming office visit.   

## 2021-11-26 ENCOUNTER — Ambulatory Visit (INDEPENDENT_AMBULATORY_CARE_PROVIDER_SITE_OTHER): Payer: 59 | Admitting: Family Medicine

## 2021-11-26 ENCOUNTER — Other Ambulatory Visit: Payer: Self-pay

## 2021-11-26 ENCOUNTER — Encounter: Payer: Self-pay | Admitting: Family Medicine

## 2021-11-26 DIAGNOSIS — K0889 Other specified disorders of teeth and supporting structures: Secondary | ICD-10-CM

## 2021-11-26 MED ORDER — AMOXICILLIN-POT CLAVULANATE 875-125 MG PO TABS
1.0000 | ORAL_TABLET | Freq: Two times a day (BID) | ORAL | 0 refills | Status: DC
  Filled 2021-11-26: qty 20, 10d supply, fill #0

## 2021-11-26 MED ORDER — GLIPIZIDE ER 5 MG PO TB24: ORAL_TABLET | ORAL | 11 refills | Status: DC

## 2021-11-26 NOTE — Progress Notes (Unsigned)
Presumed dental source.  She had some extractions but she remembers the pain from the procedure in spite of anesthesia.  She is considering options in the meantime.    Last A1c 8.4.  on pradaxa, no bleeding.  Using a soft tooth brush.    Upper lip puffiness started last night.  Sore along the upper gum line and L max sinus area.  No fevers.  No chills.  No vomiting.  No wheeze, no stridor.  No tongue swelling.  She is clearly locally sensitive at the upper local incisors.       Multiple cracked upper incisors.    She has a medical indication for extraction.

## 2021-11-26 NOTE — Patient Instructions (Addendum)
Check with the [facility] dental clinic if you can't get help with the routine clinic.  Rinse with saline and start augmentin.  Take care.  Glad to see you.

## 2021-11-27 ENCOUNTER — Other Ambulatory Visit: Payer: Self-pay

## 2021-11-27 ENCOUNTER — Ambulatory Visit (INDEPENDENT_AMBULATORY_CARE_PROVIDER_SITE_OTHER): Payer: 59 | Admitting: Family Medicine

## 2021-11-27 ENCOUNTER — Encounter: Payer: Self-pay | Admitting: Family Medicine

## 2021-11-27 VITALS — BP 158/80 | HR 70 | Temp 98.2°F | Ht 66.5 in | Wt 241.4 lb

## 2021-11-27 DIAGNOSIS — I4891 Unspecified atrial fibrillation: Secondary | ICD-10-CM

## 2021-11-27 DIAGNOSIS — E1169 Type 2 diabetes mellitus with other specified complication: Secondary | ICD-10-CM | POA: Insufficient documentation

## 2021-11-27 DIAGNOSIS — E785 Hyperlipidemia, unspecified: Secondary | ICD-10-CM

## 2021-11-27 DIAGNOSIS — Z0000 Encounter for general adult medical examination without abnormal findings: Secondary | ICD-10-CM

## 2021-11-27 DIAGNOSIS — Z23 Encounter for immunization: Secondary | ICD-10-CM

## 2021-11-27 DIAGNOSIS — E1165 Type 2 diabetes mellitus with hyperglycemia: Secondary | ICD-10-CM | POA: Diagnosis not present

## 2021-11-27 DIAGNOSIS — Z1211 Encounter for screening for malignant neoplasm of colon: Secondary | ICD-10-CM | POA: Diagnosis not present

## 2021-11-27 DIAGNOSIS — I152 Hypertension secondary to endocrine disorders: Secondary | ICD-10-CM

## 2021-11-27 DIAGNOSIS — E2839 Other primary ovarian failure: Secondary | ICD-10-CM

## 2021-11-27 DIAGNOSIS — E1159 Type 2 diabetes mellitus with other circulatory complications: Secondary | ICD-10-CM | POA: Diagnosis not present

## 2021-11-27 DIAGNOSIS — E66812 Obesity, class 2: Secondary | ICD-10-CM

## 2021-11-27 DIAGNOSIS — Z6838 Body mass index (BMI) 38.0-38.9, adult: Secondary | ICD-10-CM

## 2021-11-27 LAB — HM DIABETES FOOT EXAM

## 2021-11-27 MED ORDER — ATORVASTATIN CALCIUM 10 MG PO TABS
10.0000 mg | ORAL_TABLET | Freq: Every day | ORAL | 11 refills | Status: DC
  Filled 2021-11-27: qty 6, 6d supply, fill #0
  Filled 2021-11-27: qty 24, 24d supply, fill #0
  Filled 2022-03-13: qty 30, 30d supply, fill #1
  Filled 2022-07-22: qty 30, 30d supply, fill #2
  Filled 2022-09-03: qty 30, 30d supply, fill #3

## 2021-11-27 NOTE — Assessment & Plan Note (Signed)
Chronic, improving control but remains inadequate.  She states she was doing much better with lifestyle prior to increase stress in the last 2 months.  She is willing to get back on track with low carbohydrate diet.  She will try to work on some increase in exercise and weight loss.  For now she will continue Glucotrol XL 10 mg p.o. daily. We will reevaluate in 3 months and if her A1c is not continuing to improve or at goal we can consider a trial of Ozempic.  She will also call with fasting blood sugars in the next few weeks, if they remain significantly elevated we can try a trial of Ozempic earlier.

## 2021-11-27 NOTE — Assessment & Plan Note (Signed)
Associated with diabetes, hypertension, atrial fibrillation and high cholesterol.  We discussed the need for weight loss.  We can consider a trial of Ozempic for weight management as well as diabetes if A1C does not improve with lifestyle changes.

## 2021-11-27 NOTE — Assessment & Plan Note (Signed)
Chronic, rate controlled with carditis MCD 120 mg p.o. daily and flecainide.  Followed by cardiology.

## 2021-11-27 NOTE — Patient Instructions (Addendum)
Set up yearly eye exam for diabetes  with [facility] .   Check BP at home or work off and on.. call if > 140/90   Work on low carbohydrate and low cholesterol diet.  Start atorvastatin 10 mg daily for cholesterol,  can use Co Enzyme Q 10 for side effects. Check blood sugars in morning for next 2-3 weeks and bring in measurement.   If  blood sugars are remaining high we will consider start Ozempic.   Stop at lab on way out for stool  test.    Please call the location of your choice from the menu below to schedule your Mammogram and/or Bone Density appointment.     [name]   Phone:  [phone]   [address]                                                                            [place], [place] [address]                                            Services: 3D Mammogram and Bone [facility])  Phone:  [phone]   [address]                        [place], [place] [address]                                              Services:  3D Mammogram and Bone Density

## 2021-11-27 NOTE — Assessment & Plan Note (Signed)
Chronic, previously controlled.  She reports she did have some increased stress today and feels that her blood pressure may be elevated due to this.  Her blood pressure at our office yesterday was within the normal range. She will continue to follow this blood pressure at home or work and will call with measurements in the next 1 to 2 weeks.

## 2021-11-27 NOTE — Assessment & Plan Note (Signed)
Chronic, LDL not at goal less than 384.  Start atorvastatin 10 mg p.o. daily.  Reevaluate in 3 months

## 2021-11-27 NOTE — Progress Notes (Signed)
Patient ID: [name] Strickland, female    DOB: [birth year], 71 y.o.   MRN: [id number]  This visit was conducted in person.  BP (!) 160/84   Pulse 70   Temp 98.2 F (36.8 C) (Oral)   Ht 5' 6.5" (1.689 m)   Wt 241 lb 6 oz (109.5 kg)   SpO2 97%   BMI 38.38 kg/m    CC:  Chief Complaint  Patient presents with   Annual Exam    Subjective:   HPI: [name] Strickland is a 71 y.o. female presenting on 11/27/2021 for Annual Exam  Atrial fibrillation: Followed by cardiology on flecainide 100 mg 2 times daily and Cardizem CD 120 mg p.o. daily for rate control.  On [name] 150 mg p.o. twice daily for anticoagulation. Reviewed last office visit note from Maxine Glenn, [place] on May 18, 2021 No changes made  Next appt in Dec 2023.   Currently being treated with antibiotics for dental infection. Has noted some improvement in lips swelling with Augmentin.  Hypertension:  Poor control of blood pressure  in office today despite Cardizem 120 mg CD daily. She has been under stress at work. BP Readings from Last 3 Encounters:  11/27/21 (!) 160/84  11/26/21 128/80  05/18/21 132/76  Using medication without problems or lightheadedness:  none Chest pain with exertion: none Edema: none Short of breath: none Average home BPs: not checking recently Other issues:  Diabetes: Inadequate control on Glucotrol XL 10 mg p.o. daily  SE to metformin in past. Lab Results  Component Value Date   HGBA1C 8.4 (H) 11/21/2021  Using medications without difficulties: Hypoglycemic episodes: none Hyperglycemic episodes: Feet problems: FBS 160 Blood Sugars averaging: eye exam within last year:  Elevated Cholesterol:  Inadeqate control on for statin.Marland Kitchen stain indicated. Using medications without problems: Muscle aches:  Diet compliance: poor Exercise: minimal Other complaints:       Relevant past medical, surgical, family and social history reviewed and updated as indicated. Interim medical history since our  last visit reviewed. Allergies and medications reviewed and updated. Outpatient Medications Prior to Visit  Medication Sig Dispense Refill   acetaminophen (TYLENOL) 650 MG CR tablet Take 650 mg by mouth at bedtime as needed for pain.     amoxicillin-clavulanate (AUGMENTIN) 875-125 MG tablet Take 1 tablet by mouth 2 (two) times daily. 20 tablet 0   cyclobenzaprine (FLEXERIL) 10 MG tablet Take 0.5-1 tablets (5-10 mg total) by mouth at bedtime as needed for muscle spasms. 30 tablet 0   dabigatran ([name]) 150 MG CAPS capsule Take 1 capsule (150 mg total) by mouth 2 (two) times daily. 60 capsule 5   diltiazem (CARDIZEM CD) 120 MG 24 hr capsule TAKE 1 CAPSULE BY MOUTH DAILY. 90 capsule 2   flecainide (TAMBOCOR) 100 MG tablet Take 1 tablet (100 mg total) by mouth 2 (two) times daily. 180 tablet 2   glipiZIDE (GLUCOTROL XL) 5 MG 24 hr tablet 2 tabs daily. 60 tablet 11   traMADol (ULTRAM) 50 MG tablet Take 1 tablet (50 mg total) by mouth every 12 (twelve) hours as needed. 30 tablet 0   No facility-administered medications prior to visit.     Per HPI unless specifically indicated in ROS section below Review of Systems  Constitutional:  Negative for fatigue and fever.  HENT:  Negative for congestion.   Eyes:  Negative for pain.  Respiratory:  Negative for cough and shortness of breath.   Cardiovascular:  Negative for chest pain, palpitations and  leg swelling.  Gastrointestinal:  Negative for abdominal pain.  Genitourinary:  Negative for dysuria and vaginal bleeding.  Musculoskeletal:  Negative for back pain.  Neurological:  Negative for syncope, light-headedness and headaches.  Psychiatric/Behavioral:  Negative for dysphoric mood.    Objective:  BP (!) 160/84   Pulse 70   Temp 98.2 F (36.8 C) (Oral)   Ht 5' 6.5" (1.689 m)   Wt 241 lb 6 oz (109.5 kg)   SpO2 97%   BMI 38.38 kg/m   Wt Readings from Last 3 Encounters:  11/27/21 241 lb 6 oz (109.5 kg)  11/26/21 242 lb (109.8 kg)   05/18/21 243 lb 9.6 oz (110.5 kg)      Physical Exam Vitals and nursing note reviewed.  Constitutional:      General: She is not in acute distress.    Appearance: Normal appearance. She is well-developed. She is obese. She is not ill-appearing or toxic-appearing.  HENT:     Head: Normocephalic.     Right Ear: Hearing, tympanic membrane, ear canal and external ear normal.     Left Ear: Hearing, tympanic membrane, ear canal and external ear normal.     Nose: Nose normal.  Eyes:     General: Lids are normal. Lids are everted, no foreign bodies appreciated.     Conjunctiva/sclera: Conjunctivae normal.     Pupils: Pupils are equal, round, and reactive to light.  Neck:     Thyroid: No thyroid mass or thyromegaly.     Vascular: No carotid bruit.     Trachea: Trachea normal.  Cardiovascular:     Rate and Rhythm: Normal rate and regular rhythm.     Heart sounds: Normal heart sounds, S1 normal and S2 normal. No murmur heard.    No gallop.  Pulmonary:     Effort: Pulmonary effort is normal. No respiratory distress.     Breath sounds: Normal breath sounds. No wheezing, rhonchi or rales.  Abdominal:     General: Bowel sounds are normal. There is no distension or abdominal bruit.     Palpations: Abdomen is soft. There is no fluid wave or mass.     Tenderness: There is no abdominal tenderness. There is no guarding or rebound.     Hernia: No hernia is present.  Musculoskeletal:     Cervical back: Normal range of motion and neck supple.  Lymphadenopathy:     Cervical: No cervical adenopathy.  Skin:    General: Skin is warm and dry.     Findings: No rash.  Neurological:     Mental Status: She is alert.     Cranial Nerves: No cranial nerve deficit.     Sensory: No sensory deficit.  Psychiatric:        Mood and Affect: Mood is not anxious or depressed.        Speech: Speech normal.        Behavior: Behavior normal. Behavior is cooperative.        Judgment: Judgment normal.      Diabetic foot exam: Normal inspection No skin breakdown No calluses  Normal DP pulses Normal sensation to light touch and monofilament Nails normal     Results for orders placed or performed in visit on 11/21/21  Microalbumin / creatinine urine ratio  Result Value Ref Range   Microalb, Ur <0.7 0.0 - 1.9 mg/dL   Creatinine,U 85.2 mg/dL   Microalb Creat Ratio 1.1 0.0 - 30.0 mg/g  Hepatitis C antibody  Result Value Ref  Range   Hepatitis C Ab NON-REACTIVE NON-REACTIVE  Comprehensive metabolic panel  Result Value Ref Range   Sodium 138 135 - 145 mEq/L   Potassium 4.6 3.5 - 5.1 mEq/L   Chloride 100 96 - 112 mEq/L   CO2 29 19 - 32 mEq/L   Glucose, Bld 236 (H) 70 - 99 mg/dL   BUN 11 6 - 23 mg/dL   Creatinine, Ser 6.29 0.40 - 1.20 mg/dL   Total Bilirubin 0.6 0.2 - 1.2 mg/dL   Alkaline Phosphatase 83 39 - 117 U/L   AST 14 0 - 37 U/L   ALT 13 0 - 35 U/L   Total Protein 7.0 6.0 - 8.3 g/dL   Albumin 4.0 3.5 - 5.2 g/dL   GFR 52.84 >13.24 mL/min   Calcium 9.3 8.4 - 10.5 mg/dL  Lipid panel  Result Value Ref Range   Cholesterol 230 (H) 0 - 200 mg/dL   Triglycerides 401.0 (H) 0.0 - 149.0 mg/dL   HDL 27.25 >36.64 mg/dL   VLDL 40.3 (H) 0.0 - 47.4 mg/dL   Total CHOL/HDL Ratio 4    NonHDL 173.92   Hemoglobin A1c  Result Value Ref Range   Hgb A1c MFr Bld 8.4 (H) 4.6 - 6.5 %  LDL cholesterol, direct  Result Value Ref Range   Direct LDL 139.0 mg/dL     COVID 19 screen:  No recent travel or known exposure to COVID19 The patient denies respiratory symptoms of COVID 19 at this time. The importance of social distancing was discussed today.   Assessment and Plan   The patient's preventative maintenance and recommended screening tests for an annual wellness exam were reviewed in full today. Brought up to date unless services declined.  Counselled on the importance of diet, exercise, and its role in overall health and mortality. The patient's FH and SH was reviewed, including  their home life, tobacco status, and drug and alcohol status.   Due for yearly eye exam Vaccines: Recommended Tdap, Shingrix series, pneumonia vaccine. Pap/DVE: Not indicated Mammo: Overdue Bone Density: Overdue Colon: Overdue  Smoking Status:none ETOH/ drug QVZ:DGLO/VFIE  Hep C: done  Problem List Items Addressed This Visit     Atrial fibrillation (HCC)    Chronic, rate controlled with carditis MCD 120 mg p.o. daily and flecainide.  Followed by cardiology.      Relevant Medications   atorvastatin (LIPITOR) 10 MG tablet   Class 2 severe obesity due to excess calories with serious comorbidity and body mass index (BMI) of 38.0 to 38.9 in adult [facility])    Associated with diabetes, hypertension, atrial fibrillation and high cholesterol.  We discussed the need for weight loss.  We can consider a trial of Ozempic for weight management as well as diabetes if A1C does not improve with lifestyle changes.      Essential hypertension    Chronic, previously controlled.  She reports she did have some increased stress today and feels that her blood pressure may be elevated due to this.  Her blood pressure at our office yesterday was within the normal range. She will continue to follow this blood pressure at home or work and will call with measurements in the next 1 to 2 weeks.      Relevant Medications   atorvastatin (LIPITOR) 10 MG tablet   Hyperlipidemia associated with type 2 diabetes mellitus (HCC)    Chronic, LDL not at goal less than 332.  Start atorvastatin 10 mg p.o. daily.  Reevaluate in 3 months  Relevant Medications   atorvastatin (LIPITOR) 10 MG tablet   Type 2 diabetes mellitus with hyperglycemia, without long-term current use of insulin (HCC)    Chronic, improving control but remains inadequate.  She states she was doing much better with lifestyle prior to increase stress in the last 2 months.  She is willing to get back on track with low carbohydrate diet.  She will try to  work on some increase in exercise and weight loss.  For now she will continue Glucotrol XL 10 mg p.o. daily. We will reevaluate in 3 months and if her A1c is not continuing to improve or at goal we can consider a trial of Ozempic.  She will also call with fasting blood sugars in the next few weeks, if they remain significantly elevated we can try a trial of Ozempic earlier.      Relevant Medications   atorvastatin (LIPITOR) 10 MG tablet   Other Relevant Orders   Pneumococcal conjugate vaccine 20-valent (Prevnar 20) (Completed)   Other Visit Diagnoses     Routine general medical examination at a health care facility    -  Primary   Estrogen deficiency       Relevant Orders   DG Bone Density   Colon cancer screening       Relevant Orders   Fecal occult blood, imunochemical   Need for vaccination against Streptococcus pneumoniae       Relevant Orders   Pneumococcal conjugate vaccine 20-valent (Prevnar 20) (Completed)         Kerby Nora, MD

## 2021-11-28 DIAGNOSIS — K0889 Other specified disorders of teeth and supporting structures: Secondary | ICD-10-CM | POA: Insufficient documentation

## 2021-11-28 NOTE — Assessment & Plan Note (Signed)
Discussed options.  I presume she has an infectious dental source on her upper incisors that is causing maxillary pain.  Start Augmentin, routine cautions given to patient.  Okay for outpatient follow-up.  No reason to suspect anaphylactic reaction or other ominous process that causing lip swelling.  No stridor.  She has follow-up PCP tomorrow.  Discussed dental follow-up.

## 2021-12-06 LAB — FECAL OCCULT BLOOD, IMMUNOCHEMICAL: Fecal Occult Bld: NEGATIVE

## 2021-12-07 ENCOUNTER — Telehealth: Payer: Self-pay

## 2021-12-07 ENCOUNTER — Other Ambulatory Visit: Payer: Self-pay

## 2021-12-07 MED ORDER — FLUCONAZOLE 150 MG PO TABS
150.0000 mg | ORAL_TABLET | Freq: Once | ORAL | 0 refills | Status: AC
  Filled 2021-12-07: qty 1, 1d supply, fill #0

## 2021-12-07 NOTE — Telephone Encounter (Signed)
Pt states that she has recently finished an antibiotic and now has a yeast infection. Requesting Rx to treat. Please send to [facility] health care pharmacy.

## 2022-01-07 ENCOUNTER — Other Ambulatory Visit: Payer: Self-pay

## 2022-01-11 ENCOUNTER — Other Ambulatory Visit: Payer: Self-pay | Admitting: Family Medicine

## 2022-01-11 ENCOUNTER — Other Ambulatory Visit: Payer: Self-pay

## 2022-01-11 NOTE — Telephone Encounter (Signed)
Pt states that her fasting BS are ranging from 150-225.  BP 130/82 and 124/80 are recent BP's.

## 2022-01-18 ENCOUNTER — Other Ambulatory Visit: Payer: Self-pay

## 2022-01-18 MED ORDER — GLIPIZIDE ER 10 MG PO TB24
10.0000 mg | ORAL_TABLET | Freq: Every day | ORAL | 11 refills | Status: DC
  Filled 2022-01-18: qty 30, 30d supply, fill #0
  Filled 2022-03-13: qty 30, 30d supply, fill #1
  Filled 2022-06-10: qty 30, 30d supply, fill #2
  Filled 2022-07-22: qty 30, 30d supply, fill #3
  Filled 2022-09-03: qty 30, 30d supply, fill #4
  Filled 2022-10-08: qty 30, 30d supply, fill #5
  Filled 2022-11-11: qty 30, 30d supply, fill #6
  Filled 2022-12-09: qty 30, 30d supply, fill #7
  Filled 2023-01-06: qty 30, 30d supply, fill #8

## 2022-02-06 ENCOUNTER — Other Ambulatory Visit: Payer: Self-pay

## 2022-02-06 ENCOUNTER — Telehealth: Payer: Self-pay | Admitting: Family Medicine

## 2022-02-06 MED ORDER — FLUCONAZOLE 150 MG PO TABS
150.0000 mg | ORAL_TABLET | Freq: Once | ORAL | 0 refills | Status: AC
  Filled 2022-02-06: qty 1, 1d supply, fill #0

## 2022-02-06 NOTE — Telephone Encounter (Signed)
[name] Strickland notified that prescription has been sent to her pharmacy as requested.

## 2022-02-06 NOTE — Telephone Encounter (Signed)
Took Augmentin again for another tooth infection, now have a yeast infection. Tried OTC Monistat which helped but didn't clear up completely. Has no vaginal discharge now but vaginal and perineal itch and irritation. Requesting Diflucan to be sent over to  [facility]. Thank you! Phone: [phone]  Fax: [phone]

## 2022-02-06 NOTE — Addendum Note (Signed)
Addended byKerby Nora E on: 02/06/2022 01:00 PM   Modules accepted: Orders

## 2022-02-06 NOTE — Telephone Encounter (Signed)
Let patient know prescription has been sent in

## 2022-03-13 ENCOUNTER — Other Ambulatory Visit: Payer: Self-pay

## 2022-03-13 MED FILL — Flecainide Acetate Tab 100 MG: ORAL | 90 days supply | Qty: 180 | Fill #1 | Status: AC

## 2022-03-13 MED FILL — Diltiazem HCl Coated Beads Cap ER 24HR 120 MG: ORAL | 90 days supply | Qty: 90 | Fill #1 | Status: AC

## 2022-03-14 ENCOUNTER — Other Ambulatory Visit: Payer: Self-pay

## 2022-03-15 ENCOUNTER — Ambulatory Visit: Payer: 59 | Admitting: Internal Medicine

## 2022-03-15 ENCOUNTER — Other Ambulatory Visit: Payer: Self-pay

## 2022-03-19 ENCOUNTER — Other Ambulatory Visit: Payer: Self-pay

## 2022-03-19 ENCOUNTER — Other Ambulatory Visit: Payer: Self-pay | Admitting: Family Medicine

## 2022-03-19 MED ORDER — TRAMADOL HCL 50 MG PO TABS
50.0000 mg | ORAL_TABLET | Freq: Two times a day (BID) | ORAL | 0 refills | Status: DC | PRN
  Filled 2022-03-19: qty 30, 15d supply, fill #0

## 2022-03-19 NOTE — Telephone Encounter (Signed)
PDMP reviewed during this encounter.  

## 2022-03-19 NOTE — Telephone Encounter (Signed)
Last office visit 11/27/21 for CPE.  Last refilled 08/03/21 for #30 with no refills.  No future appointments with PCP.

## 2022-03-19 NOTE — Telephone Encounter (Signed)
Caller Name: [place]iston Call back phone #: [phone]  MEDICATION(S):  traMADol (ULTRAM) 50 MG tablet   Days of Med Remaining: 0  Has the patient contacted their pharmacy (YES/NO)? NO What did pharmacy advise?   Preferred Pharmacy:   [facility]   ~~~Please advise patient/caregiver to allow 2-3 business days to process RX refills.

## 2022-03-21 ENCOUNTER — Other Ambulatory Visit: Payer: Self-pay

## 2022-03-29 ENCOUNTER — Telehealth: Payer: 59

## 2022-04-22 ENCOUNTER — Other Ambulatory Visit: Payer: Self-pay

## 2022-04-24 IMAGING — CT CT HEAD W/O CM
4 series · 16 of 47 positions shown, 18 images · non-contrast
Comparison: No pertinent prior exams available for comparison.

CLINICAL DATA: Headache, intracranial hemorrhage suspected.
Additional history provided: Patient reports left arm numbness and
upper lip numbness, these symptoms resolved with subsequent onset of
headache.

EXAM:
CT HEAD WITHOUT CONTRAST
TECHNIQUE: Contiguous axial images were obtained from the base of the skull
through the vertex without intravenous contrast.

[Series 2: head without · axial · non-contrast · 0.42mm/px · z∈[+1055,+1170]mm · 7 of 31 slices shown, 9 images]
[im 4/31  brain]
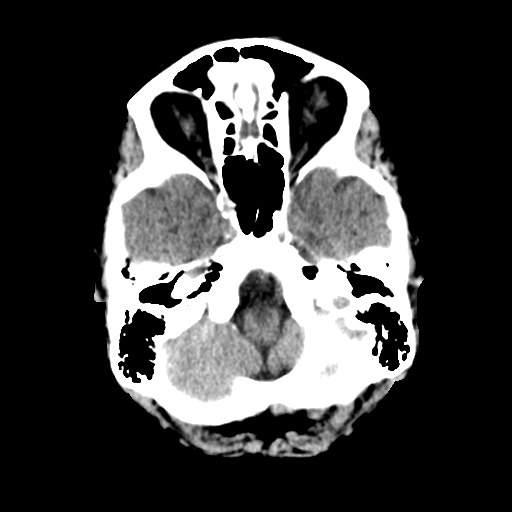
[im 4/31  bone]
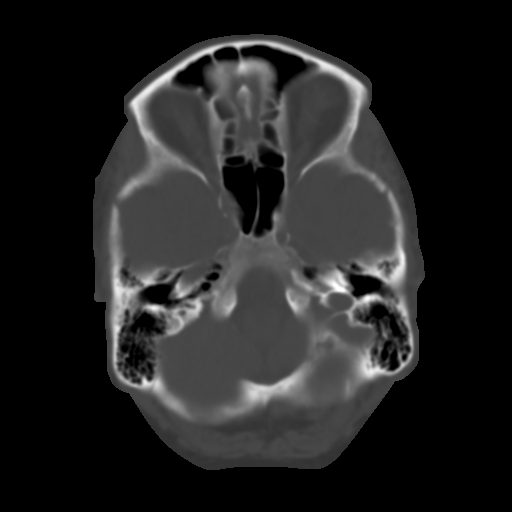
[im 8/31  brain]
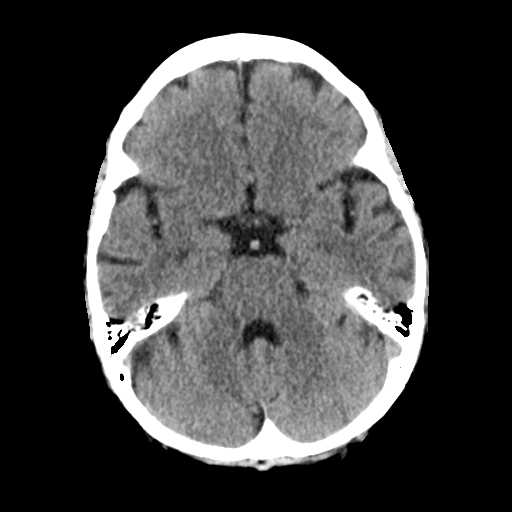
[im 12/31  brain]
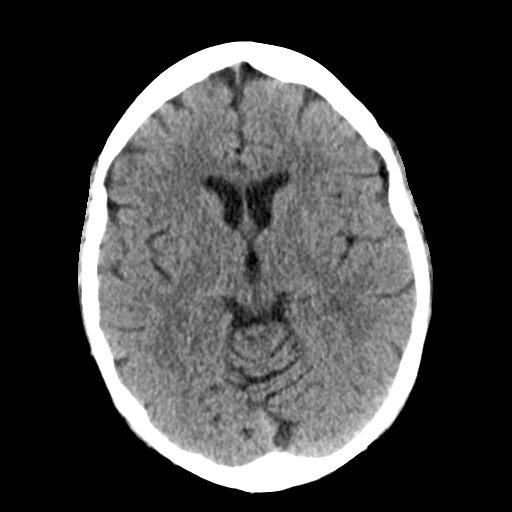
[im 16/31  brain]
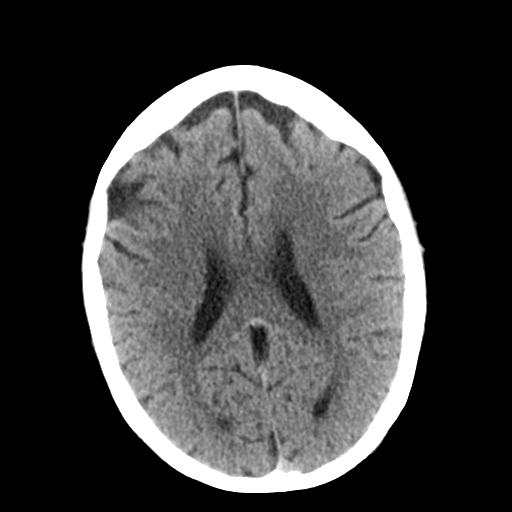
[im 19/31  brain]
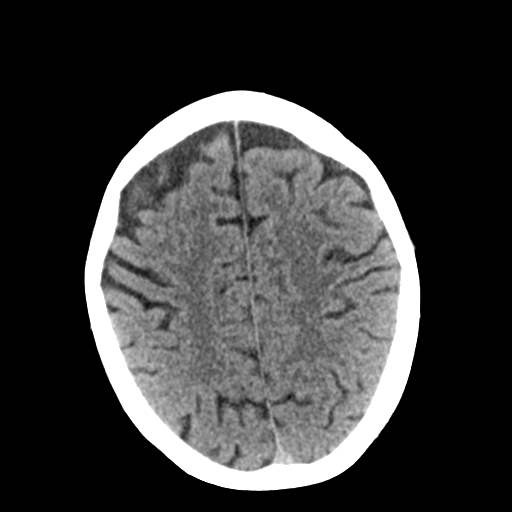
[im 19/31  bone]
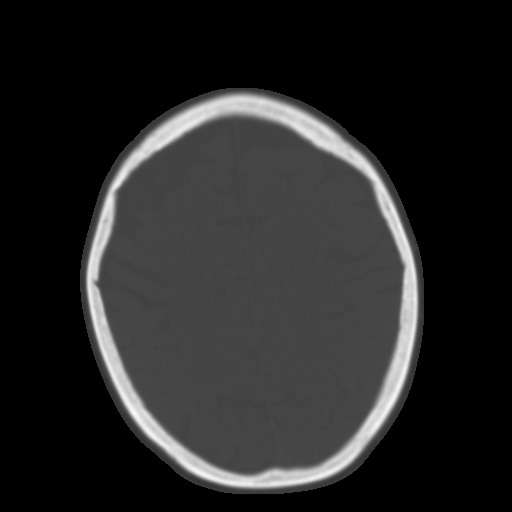
[im 23/31  brain]
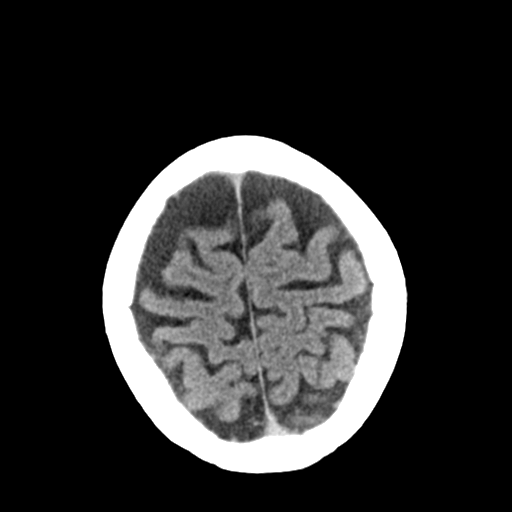
[im 27/31  brain]
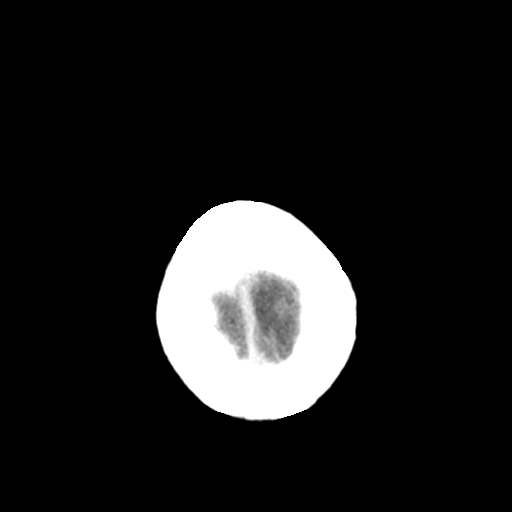

[Series 3: head bone · axial · 0.42mm/px · z∈[+1054,+1086]mm · 3 of 78 slices shown]
[im 8/78  bone]
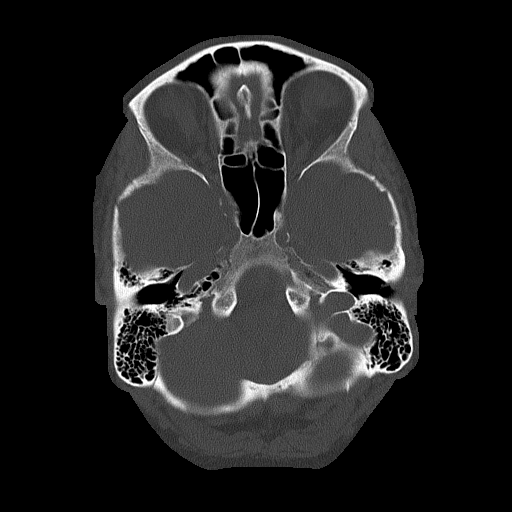
[im 16/78  bone]
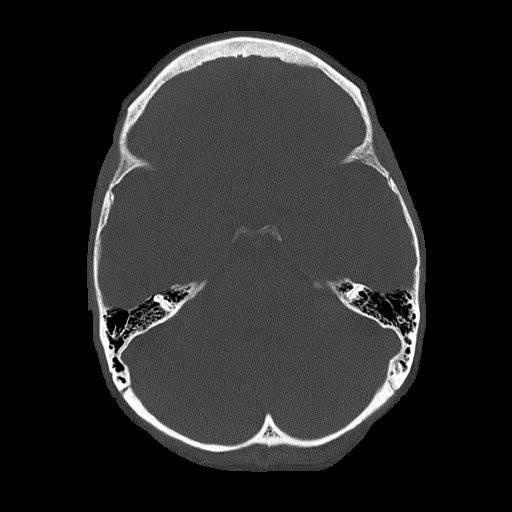
[im 24/78  bone]
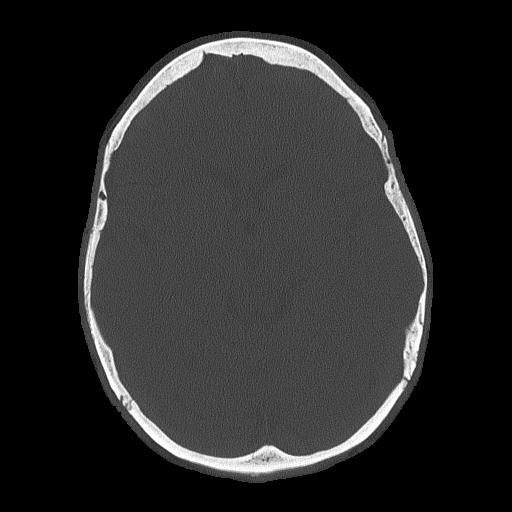

[Series 4: head without cor · coronal · non-contrast · 0.30mm/px · 3 of 67 slices shown]
[im 23/67  brain]
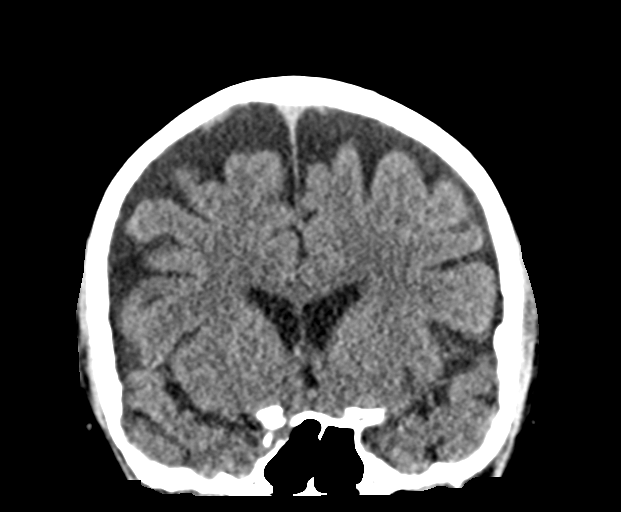
[im 30/67  brain]
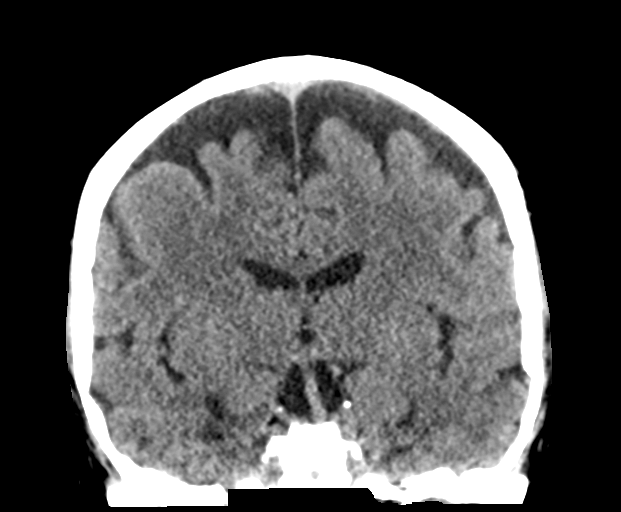
[im 37/67  brain]
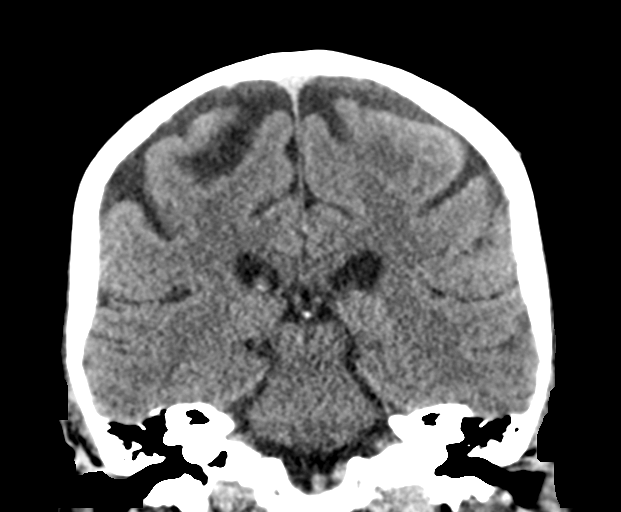

[Series 5: head without sag · sagittal · non-contrast · 0.30mm/px · 3 of 57 slices shown]
[im 19/57  brain]
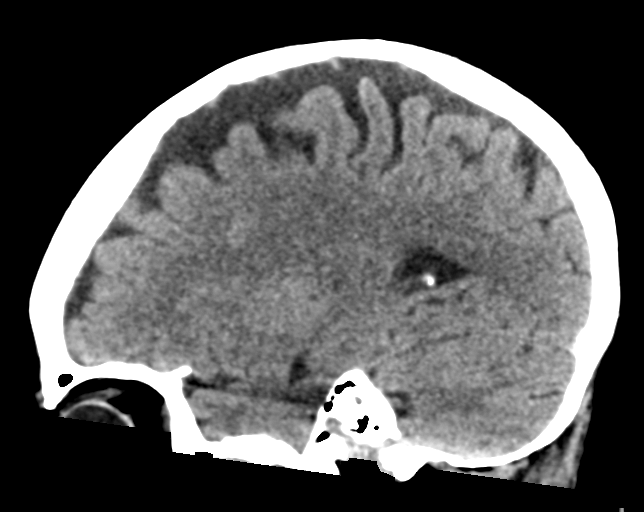
[im 29/57  brain]
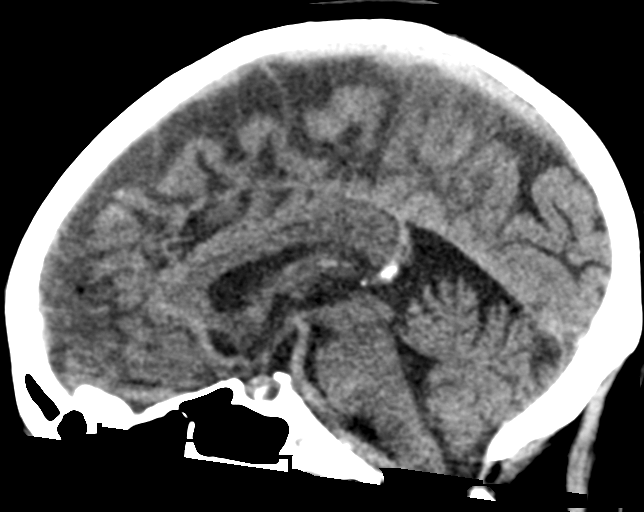
[im 38/57  brain]
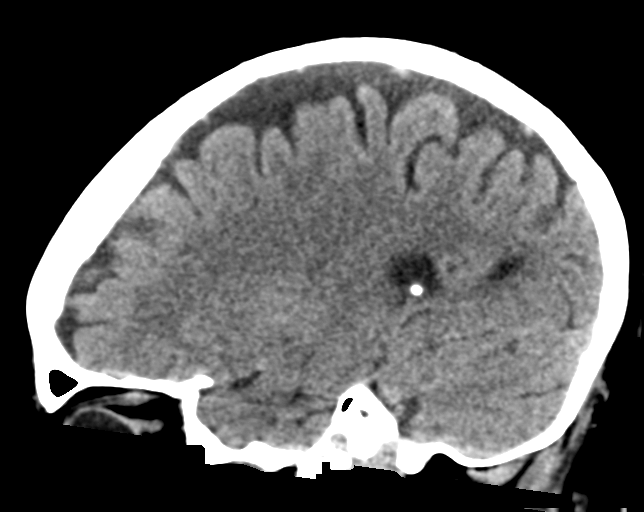

[16 of 47 positions shown; findings below may reference images not displayed]

FINDINGS: Brain:

Cerebral volume is normal for age.

There is no acute intracranial hemorrhage.

No demarcated cortical infarct.

No extra-axial fluid collection.

No evidence of an intracranial mass.

No midline shift.

Vascular: No hyperdense vessel.  Atherosclerotic calcifications

Skull: Normal. Negative for fracture or focal suspicious osseous
lesion.

Sinuses/Orbits: Visualized orbits show no acute finding. No
significant paranasal sinus disease at the imaged levels.
IMPRESSION: No evidence of acute intracranial abnormality.

## 2022-05-17 ENCOUNTER — Ambulatory Visit: Payer: Commercial Managed Care - PPO | Attending: Internal Medicine | Admitting: Internal Medicine

## 2022-05-17 ENCOUNTER — Encounter: Payer: Self-pay | Admitting: Internal Medicine

## 2022-05-17 VITALS — BP 156/88 | HR 65 | Ht 68.0 in | Wt 238.0 lb

## 2022-05-17 DIAGNOSIS — I480 Paroxysmal atrial fibrillation: Secondary | ICD-10-CM

## 2022-05-17 NOTE — Patient Instructions (Signed)
Medication Instructions:  Your physician recommends that you continue on your current medications as directed. Please refer to the Current Medication list given to you today.  *If you need a refill on your cardiac medications before your next appointment, please call your pharmacy*  Follow-Up: At [facility], you and your health needs are our priority.  As part of our continuing mission to provide you with exceptional heart care, we have created designated Provider Care Teams.  These Care Teams include your primary Cardiologist (physician) and Advanced Practice Providers (APPs -  Physician Assistants and Nurse Practitioners) who all work together to provide you with the care you need, when you need it.  Your next appointment:   1 year(s)  Provider:   You may see Cristopher Peru, MD or one of the following Advanced Practice Providers on your designated Care Team:   Tommye Standard, [place] "Jonni Sanger" [place], [place], NP

## 2022-05-17 NOTE — Progress Notes (Signed)
HPI [name] returns today for followup of her atrial fib. She is an obese 72 yo woman with atrial fib who has been treated with flecainide over the years. Since I saw her last, she notes that she has gained 7 lbs. She has moved to a new house after the death of her husband. She has some atrial fib but only a few episodes.  No problems tolerating the flecainide. Allergies  Allergen Reactions   Shellfish-Derived Products Itching and Swelling        Fish Allergy Itching and Swelling   Iodine Other (See Comments)    Unknown childhood reaction to topical iodine   Contrast Media [Iodinated Contrast Media] Other (See Comments)    Unknown reaction to topical iodine as a child   Sulfonamide Derivatives Itching and Rash     Current Outpatient Medications  Medication Sig Dispense Refill   acetaminophen (TYLENOL) 650 MG CR tablet Take 650 mg by mouth at bedtime as needed for pain.     amoxicillin-clavulanate (AUGMENTIN) 875-125 MG tablet Take 1 tablet by mouth 2 (two) times daily. 20 tablet 0   atorvastatin (LIPITOR) 10 MG tablet Take 1 tablet (10 mg total) by mouth daily. 30 tablet 11   cyclobenzaprine (FLEXERIL) 10 MG tablet Take 0.5-1 tablets (5-10 mg total) by mouth at bedtime as needed for muscle spasms. 30 tablet 0   dabigatran ([name]) 150 MG CAPS capsule Take 1 capsule (150 mg total) by mouth 2 (two) times daily. 60 capsule 5   diltiazem (CARDIZEM CD) 120 MG 24 hr capsule TAKE 1 CAPSULE BY MOUTH DAILY. 90 capsule 2   flecainide (TAMBOCOR) 100 MG tablet Take 1 tablet (100 mg total) by mouth 2 (two) times daily. 180 tablet 2   glipiZIDE (GLUCOTROL XL) 10 MG 24 hr tablet Take 1 tablet (10 mg total) by mouth daily. 30 tablet 11   traMADol (ULTRAM) 50 MG tablet Take 1 tablet (50 mg total) by mouth every 12 (twelve) hours as needed. 30 tablet 0   No current facility-administered medications for this visit.     Past Medical History:  Diagnosis Date   Diabetes mellitus    pt is  unaware and is on no medicine for this (A1C 5.9 2012)   Dyslipidemia    Hypertension    Overweight    Paroxysmal atrial fibrillation (HCC)    chads2vasc score of at least 2   Snoring    cancelled prior sleep study due to costs   Typical atrial flutter (HCC)     ROS:   All systems reviewed and negative except as noted in the HPI.   Past Surgical History:  Procedure Laterality Date   APPENDECTOMY     ATRIAL FIBRILLATION ABLATION N/A 07/26/2014   Procedure: ATRIAL FIBRILLATION ABLATION;  Surgeon: Thompson Grayer, MD;  Location: [facility] CATH LAB;  Service: Cardiovascular;  Laterality: N/A;   BREAST LUMPECTOMY     benign   CARDIOVERSION N/A 07/03/2014   Procedure: CARDIOVERSION;  Surgeon: Carlena Bjornstad, MD;  Location: [facility];  Service: Cardiovascular;  Laterality: N/A;   KNEE SURGERY     REPLACEMENT TOTAL KNEE     bilateral, different times   TEE WITHOUT CARDIOVERSION N/A 07/26/2014   Procedure: TRANSESOPHAGEAL ECHOCARDIOGRAM (TEE);  Surgeon: Lelon Perla, MD;  Location: [facility];  Service: Cardiovascular;  Laterality: N/A;   TONSILLECTOMY AND ADENOIDECTOMY       Family History  Problem Relation Age of Onset   Heart attack Mother  Heart disease Father        cabg x 7   Heart attack Father    Stroke Father      Social History   Socioeconomic History   Marital status: Widowed    Spouse name: Not on file   Number of children: Not on file   Years of education: Not on file   Highest education level: Not on file  Occupational History   Not on file  Tobacco Use   Smoking status: Never   Smokeless tobacco: Never  Vaping Use   Vaping Use: Never used  Substance and Sexual Activity   Alcohol use: No   Drug use: No   Sexual activity: Not on file  Other Topics Concern   Not on file  Social History Narrative   Pt lives in [place] with husband daughter.  Her husband is chronically ill with CHF and morbid obesity (420 lbs). She provides total care for him.   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   Social Determinants of Health   Financial Resource Strain: Not on file  Food Insecurity: Not on file  Transportation Needs: Not on file  Physical Activity: Not on file  Stress: Not on file  Social Connections: Not on file  Intimate Partner Violence: Not on file     BP (!) 156/88   Pulse 65   Ht 5' 8"$  (1.727 m)   Wt 238 lb (108 kg)   SpO2 98%   BMI 36.19 kg/m   Physical Exam:  Well appearing NAD HEENT: Unremarkable Neck:  No JVD, no thyromegally Lymphatics:  No adenopathy Back:  No CVA tenderness Lungs:  Clear with no wheezes HEART:  Regular rate rhythm, no murmurs, no rubs, no clicks Abd:  soft, positive bowel sounds, no organomegally, no rebound, no guarding Ext:  2 plus pulses, no edema, no cyanosis, no clubbing Skin:  No rashes no nodules Neuro:  CN II through XII intact, motor grossly intact  EKG - nsr  Assess/Plan: 1. PAF - she is mostly maintaining NSR. She will continue her flecainide.  2. Obesity - I encouraged the patient to lose weight and she has. Her goal is to get under 200.   3. Coags - she has had no bleeding since her last visit. She will continue the pradaxa.   Carleene Overlie Laine Fonner,MD

## 2022-05-29 ENCOUNTER — Telehealth: Payer: Self-pay | Admitting: Internal Medicine

## 2022-05-29 DIAGNOSIS — Z0279 Encounter for issue of other medical certificate: Secondary | ICD-10-CM

## 2022-05-29 NOTE — Telephone Encounter (Signed)
Called patient on 05/29/22 to inform them of the FMLA paperwork that was faxed to our office. Patient did not answer and voicemail was full. Will follow up with patient again this afternoon and tomorrow to have this completed for them.

## 2022-06-06 NOTE — Telephone Encounter (Signed)
Completed [employer] form scanned to [employer] and patient's chart.  Patient and billing notified.

## 2022-06-20 ENCOUNTER — Telehealth: Payer: Self-pay | Admitting: Internal Medicine

## 2022-06-20 NOTE — Telephone Encounter (Signed)
Pt asked to have the dates of her intermittent leave filled out for 06/18/22-06/18/23 to reflect the next year after the one we did for her past year.   Number 13 filled out to reflect 06/18/22-06/18/23 and refaxed with confirmation.

## 2022-06-20 NOTE — Telephone Encounter (Signed)
Calling in about her fmla paperwork, states that there is something missing on the forms. Please advise

## 2022-07-03 ENCOUNTER — Telehealth: Payer: Self-pay | Admitting: Internal Medicine

## 2022-07-03 NOTE — Telephone Encounter (Signed)
Avoid caffeine, remain hydrated and ok to take an extra flecainide for break through atrial fib lasting over 30 minutes.

## 2022-07-03 NOTE — Telephone Encounter (Signed)
Spoke with patient and she stated since Monday she has been going into AFIB off and on. She did experience dizziness on Monday and shortness of breath on Tuesday while in AFIB. This morning her heart rate was 164 and when she arrived at work her legs were weak.   While on the phone her heart rate had already started to come down. It was at 61 and patient was resting. She denies any shortness of breath, chest pain or discomfort, palpitations/flutters, headache, light headed or dizziness or edema at this time.   She stated when she got home she realized she has been drinking soda with caffeine and no water since Sunday and she usually does no caffeine soda. Patient states she was also taking her medicine with the soda. She feels like that has been the cause of her increased heart rate. She stated she will stop drinking the caffeine sodas that her daughter brought her to see if that helps.   She is on diltiazem, flecainide and pradaxa. She stated Dr. Lovena Le did inform her she can take an extra flecainide when she is in AFIB if needed. Patient has not taken an extra dose of flecainide as of yet.  I did advise patient to increase water in take to make she is staying hydrated and try taking her medications with water instead of the soda. Advised to continue to monitor heart rate and if she goes into AFIB she follow Dr.Taylor recommendations. If her heart is greater than 120 with symptoms to call EMS. She verbalized understanding. Will forward to provider for further advise

## 2022-07-03 NOTE — Telephone Encounter (Signed)
Patient c/o Palpitations:  High priority if patient c/o lightheadedness, shortness of breath, or chest pain  How long have you had palpitations/irregular HR/ Afib? Are you having the symptoms now? Started Monday off and on   Are you currently experiencing lightheadedness, SOB or CP? No  Do you have a history of afib (atrial fibrillation) or irregular heart rhythm? Yes  Have you checked your BP or HR? (document readings if available):This morning when moving around hr was 162  Are you experiencing any other symptoms? Monday the pt felt dizzy, and yesterday the pt felt weak and tired. The pt did state that the afib starts when she is moving around. Pt stated yesterday they did get SOB when going to the bathroom, but that was the only time they felt SOB. Pt stated this morning when she was moving around her hr was 162. Pt states that when she is sitting her hr stays in the 60's. Pt states today she still feels very tired and weak. Pt also stated that her legs are feeling very weak as well. Please advise

## 2022-07-04 NOTE — Telephone Encounter (Signed)
Patient is aware of provider message and she verbalized understanding

## 2022-07-04 NOTE — Telephone Encounter (Signed)
Left voicemail for patient to return call to office. 

## 2022-07-12 ENCOUNTER — Other Ambulatory Visit: Payer: Self-pay | Admitting: Family Medicine

## 2022-07-12 DIAGNOSIS — Z1231 Encounter for screening mammogram for malignant neoplasm of breast: Secondary | ICD-10-CM

## 2022-07-22 ENCOUNTER — Other Ambulatory Visit: Payer: Self-pay | Admitting: Internal Medicine

## 2022-07-22 ENCOUNTER — Other Ambulatory Visit: Payer: Self-pay

## 2022-07-22 MED FILL — Flecainide Acetate Tab 100 MG: ORAL | 90 days supply | Qty: 180 | Fill #2 | Status: AC

## 2022-07-22 MED FILL — Diltiazem HCl Coated Beads Cap ER 24HR 120 MG: ORAL | 90 days supply | Qty: 90 | Fill #2 | Status: AC

## 2022-07-23 ENCOUNTER — Other Ambulatory Visit: Payer: Self-pay

## 2022-07-23 MED ORDER — DABIGATRAN ETEXILATE MESYLATE 150 MG PO CAPS
150.0000 mg | ORAL_CAPSULE | Freq: Two times a day (BID) | ORAL | 5 refills | Status: DC
  Filled 2022-07-23: qty 60, 30d supply, fill #0
  Filled 2022-09-03: qty 60, 30d supply, fill #1
  Filled 2022-10-08: qty 60, 30d supply, fill #2
  Filled 2022-11-11: qty 60, 30d supply, fill #3
  Filled 2022-12-09: qty 60, 30d supply, fill #4
  Filled 2023-01-06: qty 60, 30d supply, fill #5

## 2022-07-23 NOTE — Telephone Encounter (Signed)
Prescription refill request for [name] received. Indication: AF Last office visit: 05/17/22  Rosette Reveal MD Scr: 0.75 on 11/21/21 CrCl:  119 Age: 72 Weight: 108kg  Based on above findings pt is on the appropriate dose of [name].  Refill approved.

## 2022-08-14 ENCOUNTER — Telehealth: Payer: Self-pay | Admitting: Internal Medicine

## 2022-08-14 ENCOUNTER — Emergency Department (HOSPITAL_COMMUNITY)
Admission: EM | Admit: 2022-08-14 | Discharge: 2022-08-14 | Disposition: A | Discharge status: Home / Self Care | Payer: Commercial Managed Care - PPO | Attending: Emergency Medicine | Admitting: Emergency Medicine

## 2022-08-14 ENCOUNTER — Other Ambulatory Visit: Payer: Self-pay

## 2022-08-14 ENCOUNTER — Encounter (HOSPITAL_COMMUNITY): Payer: Self-pay

## 2022-08-14 ENCOUNTER — Emergency Department (HOSPITAL_COMMUNITY): Payer: Commercial Managed Care - PPO

## 2022-08-14 DIAGNOSIS — R0602 Shortness of breath: Secondary | ICD-10-CM | POA: Diagnosis not present

## 2022-08-14 DIAGNOSIS — J811 Chronic pulmonary edema: Secondary | ICD-10-CM | POA: Diagnosis not present

## 2022-08-14 DIAGNOSIS — I10 Essential (primary) hypertension: Secondary | ICD-10-CM | POA: Diagnosis not present

## 2022-08-14 DIAGNOSIS — I4891 Unspecified atrial fibrillation: Secondary | ICD-10-CM | POA: Insufficient documentation

## 2022-08-14 DIAGNOSIS — I4819 Other persistent atrial fibrillation: Secondary | ICD-10-CM | POA: Diagnosis not present

## 2022-08-14 DIAGNOSIS — E119 Type 2 diabetes mellitus without complications: Secondary | ICD-10-CM | POA: Insufficient documentation

## 2022-08-14 DIAGNOSIS — Z7984 Long term (current) use of oral hypoglycemic drugs: Secondary | ICD-10-CM | POA: Diagnosis not present

## 2022-08-14 DIAGNOSIS — R002 Palpitations: Secondary | ICD-10-CM | POA: Diagnosis not present

## 2022-08-14 DIAGNOSIS — R42 Dizziness and giddiness: Secondary | ICD-10-CM | POA: Diagnosis present

## 2022-08-14 DIAGNOSIS — Z79899 Other long term (current) drug therapy: Secondary | ICD-10-CM | POA: Diagnosis not present

## 2022-08-14 LAB — BASIC METABOLIC PANEL
Anion gap: 12 (ref 5–15)
BUN: 7 mg/dL — ABNORMAL LOW (ref 8–23)
CO2: 21 mmol/L — ABNORMAL LOW (ref 22–32)
Calcium: 9.1 mg/dL (ref 8.9–10.3)
Chloride: 105 mmol/L (ref 98–111)
Creatinine, Ser: 0.73 mg/dL (ref 0.44–1.00)
GFR, Estimated: >60 mL/min (ref >60)
Glucose, Bld: 274 mg/dL — ABNORMAL HIGH (ref 70–99)
Potassium: 4.4 mmol/L (ref 3.5–5.1)
Sodium: 138 mmol/L (ref 135–145)

## 2022-08-14 LAB — TROPONIN I (HIGH SENSITIVITY)
Troponin I (High Sensitivity): 7 ng/L (ref <18)
Troponin I (High Sensitivity): 7 ng/L (ref <18)

## 2022-08-14 LAB — PROTIME-INR
INR: 1.1 (ref 0.8–1.2)
Prothrombin Time: 14.2 seconds (ref 11.4–15.2)

## 2022-08-14 LAB — CBC
HCT: 44.2 % (ref 36.0–46.0)
Hemoglobin: 14.5 g/dL (ref 12.0–15.0)
MCH: 27.8 pg (ref 26.0–34.0)
MCHC: 32.8 g/dL (ref 30.0–36.0)
MCV: 84.7 fL (ref 80.0–100.0)
Platelets: 328 10*3/uL (ref 150–400)
RBC: 5.22 MIL/uL — ABNORMAL HIGH (ref 3.87–5.11)
RDW: 13.2 % (ref 11.5–15.5)
WBC: 7.9 10*3/uL (ref 4.0–10.5)
nRBC: 0 % (ref 0.0–0.2)

## 2022-08-14 LAB — MAGNESIUM: Magnesium: 1.8 mg/dL (ref 1.7–2.4)

## 2022-08-14 MED ORDER — DILTIAZEM HCL ER COATED BEADS 300 MG PO CP24
300.0000 mg | ORAL_CAPSULE | Freq: Every day | ORAL | 5 refills | Status: DC
  Filled 2022-08-14: qty 30, 30d supply, fill #0
  Filled 2022-09-26: qty 30, 30d supply, fill #1

## 2022-08-14 MED ORDER — DILTIAZEM HCL ER COATED BEADS 300 MG PO CP24
300.0000 mg | ORAL_CAPSULE | Freq: Once | ORAL | Status: AC
  Administered 2022-08-14: 300 mg via ORAL
  Filled 2022-08-14: qty 1

## 2022-08-14 MED ORDER — DILTIAZEM HCL-DEXTROSE 125-5 MG/125ML-% IV SOLN (PREMIX)
5.0000 mg/h | INTRAVENOUS | Status: DC
  Administered 2022-08-14: 5 mg/h via INTRAVENOUS
  Filled 2022-08-14: qty 125

## 2022-08-14 MED ORDER — DILTIAZEM LOAD VIA INFUSION
10.0000 mg | Freq: Once | INTRAVENOUS | Status: AC
  Administered 2022-08-14: 10 mg via INTRAVENOUS
  Filled 2022-08-14: qty 10

## 2022-08-14 NOTE — ED Triage Notes (Signed)
Pt reports intermittent sob, palpitations, and light headedness for the past few days. Hx of Afib. Pt states she has been taking an extra dose of flecainide with minimal relief. Pt denies cp. Pt is AxOx4.

## 2022-08-14 NOTE — Telephone Encounter (Signed)
FYI Patient states she was in and out of AFIB for a while. She has been taking an extra dose of flecainide but it did not work.Today she woke up heart rate was 150 she was short of breath and was dizzy so she went to the ED.

## 2022-08-14 NOTE — Telephone Encounter (Signed)
Left voicemail for patient to return call to office. 

## 2022-08-14 NOTE — ED Provider Notes (Addendum)
[place] EMERGENCY DEPARTMENT AT [facility] Provider Note   CSN: [id number] Arrival date & time: 08/14/22  0827     History  Obesity Hypertension Hyperlipidemia Diabetes II Atrial fibrillation   Chief Complaint  Patient presents with   Atrial Fibrillation   Shortness of Breath   Dizziness    [name] Strickland is a 72 y.o. female with a past medical history of obesity, hypertension, hyperlipidemia, diabetes II, and atrial fibrillation who presents with lightheaded and fatigue.  Patient has longstanding history of paroxysmal atrial fibrillation with episodes occurring once about every 2-3 months. She developed lightheadedness, fatigue, and exertional breath shortness about two weeks ago. Symptoms closely match that of prior atrial fibrillation episodes. Checked her heart rate with home oximeter and values have been fluctuating between 50s and 150s. Denies fever, chills, cough, rhinorrhea, congestion, nausea, vomiting, diaphoresis, diarrhea, dysuria, syncope, palpitations, numbness, limb weakness, known sick contacts, and chest or abdominal pain.  Currently prescribed flecainide, dabigatran, and diltiazem. Reports regular adherence to these medications and will only miss roughly 1-2 doses per week, though rarely skips flecainide. She has been taking flecainide daily throughout the past several weeks. Her cardiologist advised taking an extra dose during atrial fibrillation episodes, which she has done for the past five days. Despite taking flecainide twice daily, her symptoms have persisted and heart rate remains irregular.    Atrial Fibrillation Associated symptoms include shortness of breath.  Shortness of Breath Dizziness Associated symptoms: shortness of breath        Home Medications Prior to Admission medications   Medication Sig Start Date End Date Taking? Authorizing Provider  diltiazem (CARDIZEM CD) 300 MG 24 hr capsule Take 1 capsule (300 mg total) by mouth  daily. 08/14/22 02/10/23 Yes Sheilah Pigeon, PA-C  acetaminophen (TYLENOL) 650 MG CR tablet Take 650 mg by mouth at bedtime as needed for pain.    [provider]  amoxicillin-clavulanate (AUGMENTIN) 875-125 MG tablet Take 1 tablet by mouth 2 (two) times daily. 11/26/21   Joaquim Nam, MD  atorvastatin (LIPITOR) 10 MG tablet Take 1 tablet (10 mg total) by mouth daily. 11/27/21   Bedsole, Amy E, MD  cyclobenzaprine (FLEXERIL) 10 MG tablet Take 0.5-1 tablets (5-10 mg total) by mouth at bedtime as needed for muscle spasms. 11/17/20   Bedsole, Amy E, MD  dabigatran ([name]) 150 MG CAPS capsule Take 1 capsule (150 mg total) by mouth 2 (two) times daily. 07/23/22   Marinus Maw, MD  glipiZIDE (GLUCOTROL XL) 10 MG 24 hr tablet Take 1 tablet (10 mg total) by mouth daily. 01/18/22   Bedsole, Amy E, MD  traMADol (ULTRAM) 50 MG tablet Take 1 tablet (50 mg total) by mouth every 12 (twelve) hours as needed. 03/19/22   [name], MD      Allergies    Shellfish-derived products, Fish allergy, Iodine, Contrast media [iodinated contrast media], and Sulfonamide derivatives    Review of Systems   Review of Systems  Respiratory:  Positive for shortness of breath.   Neurological:  Positive for dizziness.   Lightheadedness, fatigue  Physical Exam Updated Vital Signs BP (!) 160/144   Pulse 62   Temp 97.9 F (36.6 C) (Oral)   Resp 15   Ht 5\' 8"  (1.727 m)   Wt 108 kg   SpO2 96%   BMI 36.19 kg/m  Physical Exam  Awake and alert, fully oriented, lying comfortably in bed, not in acute distress Tachycardic with irregular rhythm, normal S1  and S2, no murmurs Capillary refill 1-2 seconds, peripheral pulses intact Breathing unlabored, lungs clear to auscultation, no crackles or wheezing Abdomen soft and non-distended, no tenderness to palpation or guarding   ED Results / Procedures / Treatments   Labs (all labs ordered are listed, but only abnormal results are displayed) Labs Reviewed   BASIC METABOLIC PANEL - Abnormal; Notable for the following components:      Result Value   CO2 21 (*)    Glucose, Bld 274 (*)    BUN 7 (*)    All other components within normal limits  CBC - Abnormal; Notable for the following components:   RBC 5.22 (*)    All other components within normal limits  PROTIME-INR  MAGNESIUM  TROPONIN I (HIGH SENSITIVITY)  TROPONIN I (HIGH SENSITIVITY)    EKG EKG Interpretation  Date/Time:  Wednesday Aug 14 2022 08:37:57 EDT Ventricular Rate:  136 PR Interval:    QRS Duration: 100 QT Interval:  342 QTC Calculation: 514 R Axis:   -28 Text Interpretation: Atrial fibrillation with rapid ventricular response Septal infarct , age undetermined Abnormal ECG When compared with ECG of 09-Nov-2020 09:15, PREVIOUS ECG IS PRESENT Confirmed by Alona Bene ([id number]) on 08/14/2022 9:07:51 AM  Radiology DG Chest Port 1 View  Result Date: 08/14/2022 CLINICAL DATA:  Atrial fibrillation. Shortness of breath palpitations. Lightheadedness. EXAM: PORTABLE CHEST 1 VIEW COMPARISON:  One-view chest x-ray 07/02/2014 FINDINGS: The heart size is normal. Mild pulmonary vascular congestion is present without frank edema. Lung volumes are low. No focal airspace disease is present. Areas of linear atelectasis or Kerley B lines are noted. The visualized soft tissues and bony thorax are unremarkable. IMPRESSION: 1. Mild pulmonary vascular congestion without frank edema. 2. Low lung volumes. Electronically Signed   By: Marin Roberts M.D.   On: 08/14/2022 09:21    Procedures Procedures   none   Medications Ordered in ED Medications  diltiazem (CARDIZEM) 1 mg/mL load via infusion 10 mg (10 mg Intravenous Bolus from Bag 08/14/22 1000)    And  diltiazem (CARDIZEM) 125 mg in dextrose 5% 125 mL (1 mg/mL) infusion (5 mg/hr Intravenous New Bag/Given 08/14/22 1001)  diltiazem (CARDIZEM CD) 24 hr capsule 300 mg (has no administration in time range)    ED Course/ Medical Decision  Making/ A&P   {   Click here for ABCD2, HEART and other calculators     CHA2DS2-VASc Score: 4                    Medical Decision Making Amount and/or Complexity of Data Reviewed Labs: ordered. Radiology: ordered.  Risk Prescription drug management.   Patient presented with symptoms consistent with prior atrial fibrillation episodes and fluctuating home heart rate measurements. Upon arrival, electrocardiogram revealed atrial fibrillation with rapid ventricular response and heart rate in the 130s. Precipitating factor of current episode remains unclear. Recent history negative for psychological stressors, medication changes, and acute illnesses. Reports regular adherence to home flecainide and has taken an extra dose daily for the past 4-5 days without any improvement in symptoms or heart rate. Previously cardioverted three times without lasting success. Intravenous diltiazem started in the emergency department. Cardiology recommended discontinuing flecainide and increasing home diltiazem dose. Close outpatient follow-up with Atrial Fibrillation Clinic has been arranged, visit scheduled for 5-29.   Final Clinical Impression(s) / ED Diagnoses Final diagnoses:  Atrial fibrillation with rapid ventricular response (HCC)    Rx / DC Orders ED Discharge Orders  Ordered    diltiazem (CARDIZEM CD) 300 MG 24 hr capsule  Daily        08/14/22 1347              Crissie Sickles, MD 08/14/22 1417    Crissie Sickles, MD 08/14/22 1418    Long, Arlyss Repress, MD 08/24/22 [phone]

## 2022-08-14 NOTE — Telephone Encounter (Signed)
Patient returned call

## 2022-08-14 NOTE — Telephone Encounter (Signed)
Patient is currently admitted and would like to speak with Dr. Ladona Ridgel or nurse to discuss medication changes that the doctor is trying to change. Requesting call back before changes are made. She states that she is extremely frustrated with her current care and would like to speak with someone soon. Please advise.

## 2022-08-14 NOTE — Discharge Instructions (Addendum)
It is unclear exactly why your current atrial fibrillation episode started and has been unresponsive to flecainide. When you arrived, we controlled your heart rate with intravenous diltiazem and consulted the cardiology team for specific recommendations. Cardiology recommended discontinuing flecainide and increasing your home diltiazem dose. Dr Elberta Fortis has scheduled an appointment for you in the Atrial Fibrillation Clinic on 5-29. As you know, this appointment is very important and will help [place] determine the best path forward regarding treatment.

## 2022-08-14 NOTE — Consult Note (Addendum)
Cardiology Consultation   Patient ID: [name] Strickland MRN: [id number]; DOB: 01/28/1951  Admit date: 08/14/2022 Date of Consult: 08/14/2022  PCP:  [name], MD   [facility] Providers Cardiologist:  None  Electrophysiologist:  Lewayne Bunting, MD     Patient Profile:   [name] Strickland is a 72 y.o. female with a hx of HTN, DM, AFib/AFlutter who is being seen 08/14/2022 for the evaluation of recurrent AFib RVRat the request of Dr. Jacqulyn Bath.  AFib hx Diagnosis goes back as far as her epic chart goes (2012) Flecainide started 2012 PVI/CTI ablation 07/26/2014 (Dr. Johney Frame)  History of Present Illness:   Ms. Ortloff saw Dr. Ladona Ridgel 05/17/22, had a recent move after the death of her husband, had some brief AFib episodes though doing well, no changes were made.  She reached out via phone with increasing frequency/duration/rates of AFib episodes, advised to take an additional PRN dose of flecainide if needed  She comes to the ER today with progressively symptomatic AFib despite utilizing the PRN dosing of her flecainide. ER note reports she does miss her meds 1-2x week. Arrives in rapid afib, started on diltiazem gtt  LABS K+ 4.4 BUN/Creat 7/0.73 (calc CrCl is 122) HS Trop 7 WBC 7.9 H/H 14/44 Plts 328  6 weeks ago she notes she accidentally drank several days of caffienated  in soda. Then felt as though she was in AF. She stayed out of work 3 days, went back to work. Felt sx were related to caffeine.  Drank water, symptoms went away.     2 weeks ago,had an episode of AF, took extra flecainide and settled it.  Feels like a few weeks now out of rhythm.  Rate "all over the place".  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   5 days ago she started taking a daily extra flecainide dose, though without correction of her rhythm She sought attention now because  her HR steady 150's and symptomatic with SOB, lightheaded.  Denies fainting/passing out, chest pain.  Denies recent illness. States her stress is at her normal levels.  NO CP  Takes [name] > occasionally forgets to take evening dose of pradaxa. She gets home from work and falls asleep.  She has been taking 300 mg (100mg  TID of flecainide) QD for one week given symptoms.    Past Medical History:  Diagnosis Date   Diabetes mellitus    pt is unaware and is on no medicine for this (A1C 5.9 2012)   Dyslipidemia    Hypertension    Overweight    Paroxysmal atrial fibrillation (HCC)    chads2vasc score of at least 2   Snoring    cancelled prior sleep study due to costs   Typical atrial flutter [facility])     Past Surgical History:  Procedure Laterality Date   APPENDECTOMY     ATRIAL FIBRILLATION ABLATION N/A 07/26/2014   Procedure: ATRIAL FIBRILLATION ABLATION;  Surgeon: [name], MD;  Location: [facility] CATH LAB;  Service: Cardiovascular;  Laterality: N/A;   BREAST LUMPECTOMY     benign   CARDIOVERSION N/A 07/03/2014   Procedure: CARDIOVERSION;  Surgeon: Luis Abed, MD;  Location: [facility] OR;  Service: Cardiovascular;  Laterality: N/A;   KNEE SURGERY     REPLACEMENT TOTAL KNEE     bilateral, different times   TEE WITHOUT  CARDIOVERSION N/A 07/26/2014   Procedure: TRANSESOPHAGEAL ECHOCARDIOGRAM (TEE);  Surgeon: Lewayne Bunting, MD;  Location: [facility] ENDOSCOPY;  Service: Cardiovascular;  Laterality: N/A;   TONSILLECTOMY AND ADENOIDECTOMY       Home Medications:  Prior to Admission medications   Medication Sig Start Date End Date Taking? Authorizing Provider  acetaminophen (TYLENOL) 650 MG CR tablet Take 650 mg by mouth at bedtime as needed for pain.    [provider]  amoxicillin-clavulanate (AUGMENTIN) 875-125 MG tablet Take 1 tablet by mouth 2 (two) times daily. 11/26/21   Joaquim Nam, MD  atorvastatin (LIPITOR) 10 MG tablet Take 1 tablet (10 mg total) by mouth daily. 11/27/21    Bedsole, Amy E, MD  cyclobenzaprine (FLEXERIL) 10 MG tablet Take 0.5-1 tablets (5-10 mg total) by mouth at bedtime as needed for muscle spasms. 11/17/20   Bedsole, Amy E, MD  dabigatran ([name]) 150 MG CAPS capsule Take 1 capsule (150 mg total) by mouth 2 (two) times daily. 07/23/22   Marinus Maw, MD  diltiazem (CARDIZEM CD) 120 MG 24 hr capsule TAKE 1 CAPSULE BY MOUTH DAILY. 10/19/21 10/22/22  Marinus Maw, MD  flecainide (TAMBOCOR) 100 MG tablet Take 1 tablet (100 mg total) by mouth 2 (two) times daily. 10/19/21   Marinus Maw, MD  glipiZIDE (GLUCOTROL XL) 10 MG 24 hr tablet Take 1 tablet (10 mg total) by mouth daily. 01/18/22   Bedsole, Amy E, MD  traMADol (ULTRAM) 50 MG tablet Take 1 tablet (50 mg total) by mouth every 12 (twelve) hours as needed. 03/19/22   [name], MD    Inpatient Medications: Scheduled Meds:  Continuous Infusions:  diltiazem (CARDIZEM) infusion 5 mg/hr (08/14/22 1001)   PRN Meds:   Allergies:    Allergies  Allergen Reactions   Shellfish-Derived Products Itching and Swelling        Fish Allergy Itching and Swelling   Iodine Other (See Comments)    Unknown childhood reaction to topical iodine   Contrast Media [Iodinated Contrast Media] Other (See Comments)    Unknown reaction to topical iodine as a child   Sulfonamide Derivatives Itching and Rash    Social History:   Social History   Socioeconomic History   Marital status: Widowed    Spouse name: Not on file   Number of children: Not on file   Years of education: Not on file   Highest education level: Not on file  Occupational History   Not on file  Tobacco Use   Smoking status: Never   Smokeless tobacco: Never  Vaping Use   Vaping Use: Never used  Substance and Sexual Activity   Alcohol use: No   Drug use: No   Sexual activity: Not on file  Other Topics Concern   Not on file  Social History Narrative   Pt lives in [place] with husband daughter.  Her husband is chronically  ill with CHF and morbid obesity (420 lbs). She provides total care for him.   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   Social Determinants of Health   Financial Resource Strain: Not on file  Food Insecurity: Not on file  Transportation Needs: Not on file  Physical Activity: Not on file  Stress: Not on file  Social Connections: Not on file  Intimate Partner Violence: Not on file    Family History:   Family History  Problem Relation Age of Onset   Heart attack Mother    Heart  disease Father        cabg x 7   Heart attack Father    Stroke Father      ROS:  Please see the history of present illness.  All other ROS reviewed and negative.     Physical Exam/Data:   Vitals:   08/14/22 0918 08/14/22 0945 08/14/22 1000 08/14/22 1015  BP:  (!) 139/103 (!) 134/109 (!) 137/106  Pulse: (!) 131 (!) 118 (!) 106 72  Resp: 15 13 17 14   Temp:      TempSrc:      SpO2:  95% 95% 94%  Weight:      Height:       No intake or output data in the 24 hours ending 08/14/22 1036    08/14/2022    8:46 AM 05/17/2022   11:48 AM 11/27/2021    2:26 PM  Last 3 Weights  Weight (lbs) 238 lb 238 lb 241 lb 6 oz  Weight (kg) 107.956 kg 107.956 kg 109.487 kg     Body mass index is 36.19 kg/m.  General:  Well nourished, well developed, in no acute distress Very poor dentition, missing teethe  HEENT: normal Neck: no JVD Vascular: No carotid bruits Cardiac:  irreg-irreg; no murmurs, gallops or rubs Lungs:  CTA b/l,  no wheezing, rhonchi or rales  Abd: soft, nontender.    Ext: no edema Musculoskeletal:  No deformities Skin: warm and dry  Neuro:  no gross focal motor abnormalities noted Psych:  Normal affect   EKG:  The EKG was personally reviewed and demonstrates:    AFib  136bpm, , no ischemic changes  OLD 05/27/2022: 65,  PR , QRS , QTc  Telemetry:  Telemetry was personally reviewed and demonstrates:  AFib 100's now  Relevant CV Studies:  07/26/2014: TTE Left  ventricle: Systolic function was normal. The estimated    ejection fraction was in the range of 55% to 60%. Wall motion was    normal; there were no regional wall motion abnormalities.  - Aortic valve: No evidence of vegetation.  - Mitral valve: No evidence of vegetation. There was mild    regurgitation.  - Left atrium: The atrium was mildly dilated. No evidence of    thrombus in the atrial cavity or appendage.  - Right atrium: The atrium was mildly dilated. No evidence of    thrombus in the atrial cavity or appendage.  - Atrial septum: No defect or patent foramen ovale was identified.    There was an atrial septal aneurysm.  - Tricuspid valve: No evidence of vegetation.  - Pulmonic valve: No evidence of vegetation.    07/26/2014: EPS/ablation CONCLUSIONS: 1. Sinus rhythm upon presentation.   2. Rotational Angiography reveals a moderate sized left atrium with four separate pulmonary veins without evidence of pulmonary vein stenosis. 3. Successful electrical isolation and anatomical encircling of all four pulmonary veins with radiofrequency current.    4. Cavo-tricuspid isthmus ablation was performed with complete bidirectional isthmus block achieved.  5. Atrial fibrillation induced with aggressive atrial pacing, successfully cardioverted to sinus rhythm 6. 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  Slow pathway ablation was therefore not performed today 7. No early apparent complications.  Laboratory Data:  High Sensitivity Troponin:   Recent Labs  Lab 08/14/22 0912  TROPONINIHS 7     Chemistry Recent Labs  Lab 08/14/22 0912  NA 138  K 4.4  CL 105  CO2 21*  GLUCOSE 274*  BUN 7*  CREATININE 0.73  CALCIUM 9.1  GFRNONAA >60  ANIONGAP 12    No results for input(s): "PROT", "ALBUMIN", "AST", "ALT", "ALKPHOS", "BILITOT" in the last 168 hours. Lipids No results for input(s): "CHOL", "TRIG", "HDL", "LABVLDL", "LDLCALC", "CHOLHDL" in the last 168 hours.   Hematology Recent Labs  Lab 08/14/22 0912  WBC 7.9  RBC 5.22*  HGB 14.5  HCT 44.2  MCV 84.7  MCH 27.8  MCHC 32.8  RDW 13.2  PLT 328   Thyroid No results for input(s): "TSH", "FREET4" in the last 168 hours.  BNPNo results for input(s): "BNP", "PROBNP" in the last 168 hours.  DDimer No results for input(s): "DDIMER" in the last 168 hours.   Radiology/Studies:   DG Chest Port 1 View Result Date: 08/14/2022 CLINICAL DATA:  Atrial fibrillation. Shortness of breath palpitations. Lightheadedness. EXAM: PORTABLE CHEST 1 VIEW COMPARISON:  One-view chest x-ray 07/02/2014 FINDINGS: The heart size is normal. Mild pulmonary vascular congestion is present without frank edema. Lung volumes are low. No focal airspace disease is present. Areas of linear atelectasis or Kerley B lines are noted. The visualized soft tissues and bony thorax are unremarkable. IMPRESSION: 1. Mild pulmonary vascular congestion without frank edema. 2. Low lung volumes. Electronically Signed   By: Marin Roberts M.D.   On: 08/14/2022 09:21     Assessment and Plan:   Paroxysmal AFib CHA2DS2Vasc is 4, on [name], appropriately dosed Admits to fairly regularly missing  evening doses of her meds, particularly her pradaxa, she understands the importance of medication compliance and stroke reduction role of her OAC  Longstanding flecainide with increasing burden 5 days of a total 300mg  daily of dose without correction of her AFib I think we need to think about a new AAD  Unfortunately she has missed OAC doses. Viona Hosking review with EPM who Kelaiah Escalona see her later though anticipate D/C flecainide Increase dilt dose for rate control strategy until we can get her TEE/DCCV, or DCCV 3 weeks of uninterrupted OAC Consider change to Xarelto since she does well taking her AM meds, nut not PM doses  She would reconsider ablation though not her 1st choice  I do not think she Keoshia Steinmetz need to be admitted  EP MD Josh Nicolosi see once out of  procedure    Risk Assessment/Risk Scores:    For questions or updates, please contact [facility] Please consult [email or web address] for contact info under    Signed, Sheilah Pigeon, PA-C  08/14/2022 10:36 AM  I have seen and examined this patient with [name].  Agree with above, note added to reflect my findings.  Patient presented to the emergency room with atrial fibrillation.  She has a longstanding history of atrial fibrillation and is on flecainide.  She was previously drinking higher caffeinated beverages and started having more frequent episodes of atrial fibrillation.  She usually takes an extra dose of flecainide which converted her to normal rhythm but this has not been occurring over the last week.  She feels weak and fatigued.  Today her heart rate was in the 150s causing increased weakness fatigue and shortness of breath.  GEN: Well nourished, well developed, in no acute distress  HEENT: normal  Neck: no JVD, carotid bruits, or masses Cardiac: irregular; no murmurs, rubs, or gallops,no edema  Respiratory:  clear to auscultation bilaterally, normal work of breathing GI: soft, nontender, nondistended, + BS MS: no deformity or atrophy  Skin: warm and dry Neuro:  Strength and sensation are intact Psych: euthymic mood, full affect  Persistent atrial fibrillation: CHA2DS2-VASc of 4.  On [name], flecainide, diltiazem.  She is unfortunately not been well-controlled on flecainide.  I think this is likely her flecainide failure.  Avalie Oconnor stop flecainide today.  She has missed a few doses of her [name].  Shaquela Weichert not plan for cardioversion at this time.  Rynlee Lisbon have her follow-up in A-fib clinic.  Tenicia Gural increase diltiazem to 300 mg daily.  Daniyal Tabor M. Shakaria Raphael MD 08/14/2022 1:52 PM

## 2022-08-28 ENCOUNTER — Ambulatory Visit (HOSPITAL_COMMUNITY)
Admit: 2022-08-28 | Discharge: 2022-08-28 | Disposition: A | Discharge status: Home / Self Care | Payer: Commercial Managed Care - PPO | Attending: Internal Medicine | Admitting: Internal Medicine

## 2022-08-28 ENCOUNTER — Ambulatory Visit (HOSPITAL_COMMUNITY): Payer: Commercial Managed Care - PPO | Admitting: Internal Medicine

## 2022-08-28 VITALS — BP 134/80 | HR 108 | Ht 68.0 in | Wt 238.0 lb

## 2022-08-28 DIAGNOSIS — R002 Palpitations: Secondary | ICD-10-CM | POA: Diagnosis not present

## 2022-08-28 DIAGNOSIS — I4892 Unspecified atrial flutter: Secondary | ICD-10-CM | POA: Insufficient documentation

## 2022-08-28 DIAGNOSIS — Z9049 Acquired absence of other specified parts of digestive tract: Secondary | ICD-10-CM | POA: Diagnosis not present

## 2022-08-28 DIAGNOSIS — R001 Bradycardia, unspecified: Secondary | ICD-10-CM | POA: Diagnosis not present

## 2022-08-28 DIAGNOSIS — Z79899 Other long term (current) drug therapy: Secondary | ICD-10-CM | POA: Diagnosis not present

## 2022-08-28 DIAGNOSIS — I480 Paroxysmal atrial fibrillation: Secondary | ICD-10-CM | POA: Insufficient documentation

## 2022-08-28 DIAGNOSIS — D6869 Other thrombophilia: Secondary | ICD-10-CM | POA: Diagnosis not present

## 2022-08-28 DIAGNOSIS — I4891 Unspecified atrial fibrillation: Secondary | ICD-10-CM | POA: Diagnosis not present

## 2022-08-28 DIAGNOSIS — Z7902 Long term (current) use of antithrombotics/antiplatelets: Secondary | ICD-10-CM | POA: Diagnosis not present

## 2022-08-28 NOTE — Progress Notes (Signed)
Patient ID: [name] Strickland, female   DOB: [birth year], 72 y.o.   MRN: [id number]      Date:  08/28/2022   ID:  [name] Strickland, [name] Strickland 1950-04-08, MRN [id number]  PCP:  [name], MD  Cardiologist:  Dr Ladona Ridgel Primary Electrophysiologist: Dr Ladona Ridgel  Atrial fibrillation    History of Present Illness: [name] Strickland is a 72 y.o. female  initially being diagnosed with atrial fibrillation in 2012 after presenting with palpitations after the traumatic death of her son.  She had afib as well as a WCT and was hospitalized for several  days.  She has recently had increasing frequency and duration of atrial fibrillation despite flecainide 150mg  BID and diltiazem.  Further medical therapy is limited by bradycardia.  She has symptoms of tachypalpitations and decreased exercise tolerance.  She has DOE and when V rates are fast she is SOB at rest.  Her chads2vasc score is 2-3 (she denies DM) and is chronically anticoagulation with pradaxa.  She has required cardioversion 07/03/14 for atrial flutter. She underwent afib ablation 07/26/14.  She was seen  5/17 for  fatigue and lack of energy since the procedure.  Denied swallowing difficulties. Denied groin issues, bruising form procedure resolving. Did have increased burping but taking PPI. 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 She had 10 hours of afib early on after ablation, converted with flecainide.  Now she is staring to feel improved and some energy is returning. 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 She had one further afib episode and returned to SR with one tab of Cardizem and  50 mg flecainide after 8 hours.  On follow up 08/28/22, she is currently in Afib. Seen in ED on 08/14/22 for Afib with RVR and EP recommended discontinuing flecainide due to failure. Discharged on diltiazem 300 mg daily. She currently takes [name] 150 mg BID and prior to  ED visit missed 2 doses at different times by accident. She has a Chadsvasc score of at least 4. She has not missed any doses of [name] since 5/16.   Today, she denies symptoms of chest pain, orthopnea, PND, lower extremity edema, claudication, dizziness, presyncope, syncope, bleeding, or neurologic sequela. The patient is tolerating medications without difficulties and is otherwise without complaint today.    Past Medical History:  Diagnosis Date   Diabetes mellitus    pt is unaware and is on no medicine for this (A1C 5.9 2012)   Dyslipidemia    Hypertension    Overweight    Paroxysmal atrial fibrillation (HCC)    chads2vasc score of at least 2   Snoring    cancelled prior sleep study due to costs   Typical atrial flutter [facility])    Past Surgical History:  Procedure Laterality Date   APPENDECTOMY     ATRIAL FIBRILLATION ABLATION N/A 07/26/2014   Procedure: ATRIAL FIBRILLATION ABLATION;  Surgeon: [name], MD;  Location: [facility] CATH LAB;  Service: Cardiovascular;  Laterality: N/A;   BREAST LUMPECTOMY     benign   CARDIOVERSION N/A 07/03/2014   Procedure: CARDIOVERSION;  Surgeon: Luis Abed, MD;  Location: [facility] OR;  Service: Cardiovascular;  Laterality: N/A;   KNEE SURGERY     REPLACEMENT TOTAL KNEE     bilateral, different times   TEE WITHOUT CARDIOVERSION N/A 07/26/2014   Procedure: TRANSESOPHAGEAL ECHOCARDIOGRAM (TEE);  Surgeon: Arlys John  Ludwig Clarks, MD;  Location: [facility] ENDOSCOPY;  Service: Cardiovascular;  Laterality: N/A;   TONSILLECTOMY AND ADENOIDECTOMY       Current Outpatient Medications  Medication Sig Dispense Refill   acetaminophen (TYLENOL) 650 MG CR tablet Take 650 mg by mouth at bedtime as needed for pain.     atorvastatin (LIPITOR) 10 MG tablet Take 1 tablet (10 mg total) by mouth daily. 30 tablet 11   cyclobenzaprine (FLEXERIL) 10 MG tablet Take 0.5-1 tablets (5-10 mg total) by mouth at bedtime as needed for muscle spasms. 30 tablet 0   dabigatran ([name]) 150 MG CAPS  capsule Take 1 capsule (150 mg total) by mouth 2 (two) times daily. 60 capsule 5   diltiazem (CARDIZEM CD) 300 MG 24 hr capsule Take 1 capsule (300 mg total) by mouth daily. 30 capsule 5   glipiZIDE (GLUCOTROL XL) 10 MG 24 hr tablet Take 1 tablet (10 mg total) by mouth daily. 30 tablet 11   traMADol (ULTRAM) 50 MG tablet Take 1 tablet (50 mg total) by mouth every 12 (twelve) hours as needed. 30 tablet 0   No current facility-administered medications for this encounter.    Allergies:   Shellfish-derived products, Fish allergy, Iodine, Contrast media [iodinated contrast media], and Sulfonamide derivatives   Social History:  The patient  reports that she has never smoked. She has never used smokeless tobacco. She reports that she does not drink alcohol and does not use drugs.   Family History:  The patient's  family history includes Heart attack in her father and mother; Heart disease in her father; Stroke in her father.    ROS:  Please see the history of present illness.   All other systems are reviewed and negative.    PHYSICAL EXAM: VS: BP 134/80; Pulse 108; weight 108 kg; height 5'8" GEN- The patient is well appearing, alert and oriented x 3 today.   Head- normocephalic, atraumatic Eyes-  Sclera clear, conjunctiva pink Ears- hearing intact Lungs- Clear to ausculation bilaterally, normal work of breathing Heart- Irregular rate and rhythm, no murmurs, rubs or gallops, PMI not laterally displaced Extremities- no clubbing, cyanosis, or edema MS- no significant deformity or atrophy Skin- no rash or lesion Psych- euthymic mood, full affect Neuro- strength and sensation are intact   Recent Labs: 11/21/2021: ALT 13 08/14/2022: BUN 7; Creatinine, Ser 0.73; Hemoglobin 14.5; Magnesium 1.8; Platelets 328; Potassium 4.4; Sodium 138   Wt Readings from Last 3 Encounters:  08/28/22 108 kg  08/14/22 108 kg  05/17/22 108 kg    ECG: Vent. rate 108 BPM PR interval * ms QRS duration 82  ms QT/QTcB 350/469 ms P-R-T axes * -12 -57 Atrial fibrillation with rapid ventricular response Anterior infarct , age undetermined Abnormal ECG When compared with ECG of 14-Aug-2022 08:37, PREVIOUS ECG IS PRESENT  Other studies Reviewed: Epic records reviewed  ECHO 07/26/14: - Left ventricle: The cavity size was normal. Wall thickness was    normal. Systolic function was normal. The estimated ejection    fraction was in the range of 55% to 60%. Wall motion was normal;    there were no regional wall motion abnormalities. Doppler    parameters are consistent with abnormal left ventricular    relaxation (grade 1 diastolic dysfunction).  - Aortic valve: There was no stenosis.  - Mitral valve: Mildly calcified annulus. There was trivial    regurgitation.  - Left atrium: The atrium was moderately dilated.  - Right ventricle: The cavity size was normal. Systolic  function    was normal.  - Right atrium: The atrium was mildly dilated.  - Pulmonary arteries: No complete TR doppler jet so unable to    estimate PA systolic pressure.  - Inferior vena cava: The vessel was normal in size. The    respirophasic diameter changes were in the normal range (>= 50%),    consistent with normal central venous pressure.   ASSESSMENT AND PLAN:  1.  Paroxysmal atrial fibrillation and atrial flutter S/P PVI 07/25/14  She is currently in Afib. We discussed multiple options given flecainide failure. We discussed Multaq, Tikosyn, and amiodarone. We discussed amiodarone as a bridge to ablation. We discussed ablation as a procedural option and to speak with EP (she would be interested in speaking with Dr. Lalla Brothers).   Continue current diltiazem dose. She will contact office with decision after contacting insurance for medication prices.   2. Secondary hypercoagulable state due to atrial fibrillation (Chadsvasc - 4) Continue [name] without interruption.   She will call office with decision.     Arta Silence, PA-C  08/28/2022 4:20 PM

## 2022-08-30 ENCOUNTER — Other Ambulatory Visit (HOSPITAL_COMMUNITY): Payer: Self-pay | Admitting: *Deleted

## 2022-08-30 MED ORDER — ALPRAZOLAM 0.25 MG PO TABS: 0.2500 mg | ORAL_TABLET | Freq: Two times a day (BID) | ORAL | 5 refills | Status: DC | PRN

## 2022-09-02 ENCOUNTER — Telehealth: Payer: Self-pay | Admitting: Pharmacist

## 2022-09-02 NOTE — Telephone Encounter (Signed)
Medication list reviewed in anticipation of upcoming Tikosyn initiation. Patient is taking alprazolam which can increase serum concentrations of Tikosyn, ok to continue using but would recommend limiting use to prn.    Patient is anticoagulated on [name] 150mg  BID on the appropriate dose. Please ensure that patient has not missed any anticoagulation doses in the 3 weeks prior to Tikosyn initiation.   Patient will need to be counseled to avoid use of Benadryl while on Tikosyn and in the 2-3 days prior to Tikosyn initiation.

## 2022-09-03 ENCOUNTER — Encounter (HOSPITAL_COMMUNITY): Payer: Commercial Managed Care - PPO | Admitting: Internal Medicine

## 2022-09-03 ENCOUNTER — Other Ambulatory Visit: Payer: Self-pay

## 2022-09-17 ENCOUNTER — Telehealth (HOSPITAL_COMMUNITY): Payer: Self-pay

## 2022-09-17 NOTE — Telephone Encounter (Signed)
Initiated prior authorization to [employer] for Tikosyn admission.  Authorization: pending for approval Date of service: 10/15/2022 Reference # [id number] Fax # [phone]

## 2022-10-02 ENCOUNTER — Telehealth (HOSPITAL_COMMUNITY): Payer: Self-pay | Admitting: *Deleted

## 2022-10-02 NOTE — Telephone Encounter (Signed)
Hospital admission for tikosyn initiation  approved through [employer] - 10/15/22 Berkley Harvey number is [id number]

## 2022-10-11 ENCOUNTER — Encounter (HOSPITAL_COMMUNITY): Payer: Self-pay

## 2022-10-15 ENCOUNTER — Ambulatory Visit (HOSPITAL_COMMUNITY)
Admission: RE | Admit: 2022-10-15 | Discharge: 2022-10-15 | Disposition: A | Discharge status: Home / Self Care | Payer: Commercial Managed Care - PPO | Source: Ambulatory Visit | Attending: Internal Medicine | Admitting: Internal Medicine

## 2022-10-15 ENCOUNTER — Encounter (HOSPITAL_COMMUNITY): Payer: Self-pay | Admitting: Internal Medicine

## 2022-10-15 ENCOUNTER — Inpatient Hospital Stay (HOSPITAL_COMMUNITY)
Admission: AD | Admit: 2022-10-15 | Discharge: 2022-10-18 | DRG: 309 | Disposition: A | Discharge status: Home / Self Care | Payer: Commercial Managed Care - PPO | Source: Ambulatory Visit | Attending: Internal Medicine | Admitting: Internal Medicine

## 2022-10-15 VITALS — BP 124/82 | HR 90 | Ht 68.0 in | Wt 235.8 lb

## 2022-10-15 DIAGNOSIS — Z7902 Long term (current) use of antithrombotics/antiplatelets: Secondary | ICD-10-CM

## 2022-10-15 DIAGNOSIS — Z91013 Allergy to seafood: Secondary | ICD-10-CM | POA: Diagnosis not present

## 2022-10-15 DIAGNOSIS — Z7984 Long term (current) use of oral hypoglycemic drugs: Secondary | ICD-10-CM

## 2022-10-15 DIAGNOSIS — Z823 Family history of stroke: Secondary | ICD-10-CM

## 2022-10-15 DIAGNOSIS — Z96653 Presence of artificial knee joint, bilateral: Secondary | ICD-10-CM | POA: Diagnosis present

## 2022-10-15 DIAGNOSIS — I4892 Unspecified atrial flutter: Secondary | ICD-10-CM | POA: Diagnosis not present

## 2022-10-15 DIAGNOSIS — E119 Type 2 diabetes mellitus without complications: Secondary | ICD-10-CM | POA: Diagnosis present

## 2022-10-15 DIAGNOSIS — I480 Paroxysmal atrial fibrillation: Secondary | ICD-10-CM | POA: Diagnosis not present

## 2022-10-15 DIAGNOSIS — D6869 Other thrombophilia: Secondary | ICD-10-CM

## 2022-10-15 DIAGNOSIS — E785 Hyperlipidemia, unspecified: Secondary | ICD-10-CM | POA: Diagnosis not present

## 2022-10-15 DIAGNOSIS — I4819 Other persistent atrial fibrillation: Secondary | ICD-10-CM | POA: Diagnosis not present

## 2022-10-15 DIAGNOSIS — I10 Essential (primary) hypertension: Secondary | ICD-10-CM | POA: Diagnosis present

## 2022-10-15 DIAGNOSIS — Z882 Allergy status to sulfonamides status: Secondary | ICD-10-CM

## 2022-10-15 DIAGNOSIS — I440 Atrioventricular block, first degree: Secondary | ICD-10-CM | POA: Diagnosis present

## 2022-10-15 DIAGNOSIS — Z8249 Family history of ischemic heart disease and other diseases of the circulatory system: Secondary | ICD-10-CM | POA: Diagnosis not present

## 2022-10-15 DIAGNOSIS — Z91041 Radiographic dye allergy status: Secondary | ICD-10-CM

## 2022-10-15 LAB — BASIC METABOLIC PANEL
Anion gap: 11 (ref 5–15)
Anion gap: 8 (ref 5–15)
BUN: 8 mg/dL (ref 8–23)
BUN: 9 mg/dL (ref 8–23)
CO2: 20 mmol/L — ABNORMAL LOW (ref 22–32)
CO2: 25 mmol/L (ref 22–32)
Calcium: 8.8 mg/dL — ABNORMAL LOW (ref 8.9–10.3)
Calcium: 8.9 mg/dL (ref 8.9–10.3)
Chloride: 104 mmol/L (ref 98–111)
Chloride: 105 mmol/L (ref 98–111)
Creatinine, Ser: 0.72 mg/dL (ref 0.44–1.00)
Creatinine, Ser: 0.72 mg/dL (ref 0.44–1.00)
GFR, Estimated: >60 mL/min (ref >60)
GFR, Estimated: >60 mL/min (ref >60)
Glucose, Bld: 286 mg/dL — ABNORMAL HIGH (ref 70–99)
Glucose, Bld: 144 mg/dL — ABNORMAL HIGH (ref 70–99)
Potassium: 3.7 mmol/L (ref 3.5–5.1)
Potassium: 4.2 mmol/L (ref 3.5–5.1)
Sodium: 135 mmol/L (ref 135–145)
Sodium: 138 mmol/L (ref 135–145)

## 2022-10-15 LAB — MAGNESIUM: Magnesium: 1.8 mg/dL (ref 1.7–2.4)

## 2022-10-15 LAB — GLUCOSE, CAPILLARY: Glucose-Capillary: 184 mg/dL — ABNORMAL HIGH (ref 70–99)

## 2022-10-15 LAB — HIV ANTIBODY (ROUTINE TESTING W REFLEX): HIV Screen 4th Generation wRfx: NONREACTIVE

## 2022-10-15 MED ORDER — ORAL CARE MOUTH RINSE: 15.0000 mL | OROMUCOSAL | Status: DC | PRN

## 2022-10-15 MED ORDER — DABIGATRAN ETEXILATE MESYLATE 150 MG PO CAPS
150.0000 mg | ORAL_CAPSULE | Freq: Two times a day (BID) | ORAL | Status: DC
  Administered 2022-10-15 – 2022-10-18 (×6): 150 mg via ORAL
  Filled 2022-10-15 (×7): qty 1

## 2022-10-15 MED ORDER — CYCLOBENZAPRINE HCL 10 MG PO TABS: 5.0000 mg | ORAL_TABLET | Freq: Every evening | ORAL | Status: DC | PRN

## 2022-10-15 MED ORDER — DOFETILIDE 500 MCG PO CAPS
500.0000 ug | ORAL_CAPSULE | Freq: Two times a day (BID) | ORAL | Status: DC
  Administered 2022-10-15 – 2022-10-17 (×4): 500 ug via ORAL
  Filled 2022-10-15 (×4): qty 1

## 2022-10-15 MED ORDER — SODIUM CHLORIDE 0.9 % IV SOLN: 250.0000 mL | INTRAVENOUS | Status: DC | PRN

## 2022-10-15 MED ORDER — SODIUM CHLORIDE 0.9% FLUSH: 3.0000 mL | INTRAVENOUS | Status: DC | PRN

## 2022-10-15 MED ORDER — GLIPIZIDE ER 10 MG PO TB24
10.0000 mg | ORAL_TABLET | Freq: Every day | ORAL | Status: DC
  Administered 2022-10-16 – 2022-10-18 (×3): 10 mg via ORAL
  Filled 2022-10-15 (×3): qty 1

## 2022-10-15 MED ORDER — SODIUM CHLORIDE 0.9% FLUSH
3.0000 mL | Freq: Two times a day (BID) | INTRAVENOUS | Status: DC
  Administered 2022-10-15 – 2022-10-18 (×7): 3 mL via INTRAVENOUS

## 2022-10-15 MED ORDER — MAGNESIUM SULFATE 2 GM/50ML IV SOLN
2.0000 g | Freq: Once | INTRAVENOUS | Status: AC
  Administered 2022-10-15: 2 g via INTRAVENOUS
  Filled 2022-10-15: qty 50

## 2022-10-15 MED ORDER — ACETAMINOPHEN 325 MG PO TABS: 650.0000 mg | ORAL_TABLET | Freq: Every evening | ORAL | Status: DC | PRN

## 2022-10-15 MED ORDER — POTASSIUM CHLORIDE CRYS ER 20 MEQ PO TBCR
60.0000 meq | EXTENDED_RELEASE_TABLET | Freq: Once | ORAL | Status: AC
  Administered 2022-10-15: 60 meq via ORAL
  Filled 2022-10-15: qty 3

## 2022-10-15 MED ORDER — DILTIAZEM HCL ER COATED BEADS 180 MG PO CP24
300.0000 mg | ORAL_CAPSULE | Freq: Every day | ORAL | Status: DC
  Administered 2022-10-15 – 2022-10-16 (×2): 300 mg via ORAL
  Filled 2022-10-15 (×2): qty 1

## 2022-10-15 MED ORDER — ATORVASTATIN CALCIUM 10 MG PO TABS
10.0000 mg | ORAL_TABLET | Freq: Every day | ORAL | Status: DC
  Administered 2022-10-15 – 2022-10-17 (×3): 10 mg via ORAL
  Filled 2022-10-15 (×3): qty 1

## 2022-10-15 NOTE — H&P (Signed)
Patient ID: [name] Strickland, female   DOB: Oct 22, 1950, 72 y.o.   MRN: [id number]      Date:  10/15/2022   ID:  [name] Strickland 06-01-1950, MRN [id number]  PCP:  [name], MD  Cardiologist:  Dr Ladona Ridgel Primary Electrophysiologist: Dr Ladona Ridgel  History of Present Illness: [name] Strickland is a 72 y.o. female  initially being diagnosed with atrial fibrillation in 2012 after presenting with palpitations after the traumatic death of her son.  She had afib as well as a WCT and was hospitalized for several  days.  She has recently had increasing frequency and duration of atrial fibrillation despite flecainide 150mg  BID and diltiazem.  Further medical therapy is limited by bradycardia.  She has symptoms of tachypalpitations and decreased exercise tolerance.  She has DOE and when V rates are fast she is SOB at rest.  Her chads2vasc score is 2-3 (she denies DM) and is chronically anticoagulation with pradaxa.  She has required cardioversion 07/03/14 for atrial flutter. She underwent afib ablation 07/26/14.  She was seen  5/17 for  fatigue and lack of energy since the procedure.  Denied swallowing difficulties. Denied groin issues, bruising form procedure resolving. Did have increased burping but taking PPI. 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 She had 10 hours of afib early on after ablation, converted with flecainide.  Now she is staring to feel improved and some energy is returning. 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 She had one further afib episode and returned to SR with one tab of Cardizem and  50 mg flecainide after 8 hours.  On follow up 08/28/22, she is currently in Afib. Seen in ED on 08/14/22 for Afib with RVR and EP recommended discontinuing flecainide due to failure. Discharged on diltiazem 300 mg daily. She currently takes [name] 150 mg BID and prior to ED visit missed 2 doses at  different times by accident. She has a Chadsvasc score of at least 4. She has not missed any doses of [name] since 5/16.   On follow up 10/15/22, she is currently in Afib. Patient is here today for Tikosyn admission. No missed doses of [name]. No benadryl use. No new medications since pharmacist review.   Today, she denies symptoms of chest pain, orthopnea, PND, lower extremity edema, claudication, dizziness, presyncope, syncope, bleeding, or neurologic sequela. The patient is tolerating medications without difficulties and is otherwise without complaint today.    Past Medical History:  Diagnosis Date   Diabetes mellitus    pt is unaware and is on no medicine for this (A1C 5.9 2012)   Dyslipidemia    Hypertension    Overweight    Paroxysmal atrial fibrillation (HCC)    chads2vasc score of at least 2   Snoring    cancelled prior sleep study due to costs   Typical atrial flutter [facility])    Past Surgical History:  Procedure Laterality Date   APPENDECTOMY     ATRIAL FIBRILLATION ABLATION N/A 07/26/2014   Procedure: ATRIAL FIBRILLATION ABLATION;  Surgeon: [name], MD;  Location: [facility] CATH LAB;  Service: Cardiovascular;  Laterality: N/A;   BREAST LUMPECTOMY     benign   CARDIOVERSION N/A 07/03/2014   Procedure: CARDIOVERSION;  Surgeon: Luis Abed, MD;  Location: [facility] OR;  Service: Cardiovascular;  Laterality: N/A;   KNEE SURGERY  REPLACEMENT TOTAL KNEE     bilateral, different times   TEE WITHOUT CARDIOVERSION N/A 07/26/2014   Procedure: TRANSESOPHAGEAL ECHOCARDIOGRAM (TEE);  Surgeon: Lewayne Bunting, MD;  Location: [facility] ENDOSCOPY;  Service: Cardiovascular;  Laterality: N/A;   TONSILLECTOMY AND ADENOIDECTOMY       Current Facility-Administered Medications  Medication Dose Route Frequency Provider Last Rate Last Admin   0.9 %  sodium chloride infusion  250 mL Intravenous PRN Zuriel Roskos, Andree Coss, MD       acetaminophen (TYLENOL) tablet 650 mg  650 mg Oral QHS PRN Eustace Pen,  PA-C       atorvastatin (LIPITOR) tablet 10 mg  10 mg Oral Daily Eustace Pen, PA-C       cyclobenzaprine (FLEXERIL) tablet 5 mg  5 mg Oral QHS PRN Eustace Pen, PA-C       dabigatran ([name]) capsule 150 mg  150 mg Oral Q12H Eddye Broxterman Daphine Deutscher, MD       diltiazem (CARDIZEM CD) 24 hr capsule 300 mg  300 mg Oral Daily Eustace Pen, PA-C       dofetilide (TIKOSYN) capsule 500 mcg  500 mcg Oral BID Regan Lemming, MD       [START ON 10/16/2022] glipiZIDE (GLUCOTROL XL) 24 hr tablet 10 mg  10 mg Oral Daily Eustace Pen, PA-C       sodium chloride flush (NS) 0.9 % injection 3 mL  3 mL Intravenous Q12H Kimberly Coye Daphine Deutscher, MD   3 mL at 10/15/22 1404   sodium chloride flush (NS) 0.9 % injection 3 mL  3 mL Intravenous PRN Deija Buhrman, Andree Coss, MD        Allergies:   Shellfish-derived products, Fish allergy, Iodine, Contrast media [iodinated contrast media], and Sulfonamide derivatives   Social History:  The patient  reports that she has never smoked. She has never used smokeless tobacco. She reports that she does not drink alcohol and does not use drugs.   Family History:  The patient's  family history includes Heart attack in her father and mother; Heart disease in her father; Stroke in her father.    ROS:  Please see the history of present illness.   All other systems are reviewed and negative.   Vitals: BP 124/82 HR 90 Weight 107 kg  GEN- The patient is well appearing, alert and oriented x 3 today.   Neck - no JVD or carotid bruit noted Lungs- Clear to ausculation bilaterally, normal work of breathing Heart- Irregular rate and rhythm, no murmurs, rubs or gallops, PMI not laterally displaced Extremities- no clubbing, cyanosis, or edema Skin - no rash or ecchymosis noted   Recent Labs: 11/21/2021: ALT 13 08/14/2022: Hemoglobin 14.5; Platelets 328 10/15/2022: BUN 8; Creatinine, Ser 0.72; Magnesium 1.8; Potassium 3.7; Sodium 135   Wt Readings from Last 3 Encounters:   10/15/22 107.5 kg  10/15/22 107 kg  08/28/22 108 kg    ECG: Vent. rate 90 BPM PR interval * ms QRS duration 64 ms QT/QTcB 370/452 ms P-R-T axes * 8 -70 Atrial fibrillation with premature ventricular or aberrantly conducted complexes Cannot rule out Anterior infarct , age undetermined Abnormal ECG When compared with ECG of 28-Aug-2022 15:33, PREVIOUS ECG IS PRESENT  Other studies Reviewed: Epic records reviewed  ECHO 07/26/14: - Left ventricle: The cavity size was normal. Wall thickness was    normal. Systolic function was normal. The estimated ejection    fraction was in the range of 55% to 60%. Wall  motion was normal;    there were no regional wall motion abnormalities. Doppler    parameters are consistent with abnormal left ventricular    relaxation (grade 1 diastolic dysfunction).  - Aortic valve: There was no stenosis.  - Mitral valve: Mildly calcified annulus. There was trivial    regurgitation.  - Left atrium: The atrium was moderately dilated.  - Right ventricle: The cavity size was normal. Systolic function    was normal.  - Right atrium: The atrium was mildly dilated.  - Pulmonary arteries: No complete TR doppler jet so unable to    estimate PA systolic pressure.  - Inferior vena cava: The vessel was normal in size. The    respirophasic diameter changes were in the normal range (>= 50%),    consistent with normal central venous pressure.   ASSESSMENT AND PLAN:  1.  Persistent atrial fibrillation and atrial flutter S/P PVI 07/25/14  She is currently in Afib.   Patient would like to pursue dofetilide and presents for dofetilide admission. Continue [name], states no missed doses in the last 3 weeks. No recent benadryl use PharmD has screened medications QTc in SR 449 ms  Addendum: Labs resulted creat 0.72, K 3.7, mag 1.8. CrCl estimate 123 mL/min qualifies for 500 mcg BID dosage.  2. Secondary hypercoagulable state due to atrial fibrillation (Chadsvasc -  4) Continue [name] without interruption.   Patient [name] Strickland present to admissions and is assigned to room 6E01.   Signed, Lake Bells, PA-C  10/15/2022 5:05 PM     I have seen and examined this patient with Lake Bells.  Agree with above, note added to reflect my findings.  Patient with a history of atrial fibrillation.  She has had multiple medications and has had atrial fibrillation ablation in 2016.  She is unfortunately continued to have episodes of atrial fibrillation making her fatigued and short of breath.  She thus presents to the hospital for dofetilide load.  GEN: Well nourished, well developed, in no acute distress  HEENT: normal  Neck: no JVD, carotid bruits, or masses Cardiac: Irregular; no murmurs, rubs, or gallops,no edema  Respiratory:  clear to auscultation bilaterally, normal work of breathing GI: soft, nontender, nondistended, + BS MS: no deformity or atrophy  Skin: warm and dry Neuro:  Strength and sensation are intact Psych: euthymic mood, full affect   Persistent atrial fibrillation/flutter: Post PVI in 2016.  She is being admitted for dofetilide load.  [name] Strickland ensure K greater than 4, magnesium greater than 2.  EKG after each dose.  If she does not convert to sinus rhythm, [name] Strickland plan for cardioversion after the fourth dose. Hypertension: Continue home medications Type 2 diabetes: Continue home medications  [name] Strickland M. Viraaj Vorndran MD 10/15/2022 5:05 PM

## 2022-10-15 NOTE — Progress Notes (Signed)
Pharmacy: Dofetilide (Tikosyn) - Initial Consult Assessment and Electrolyte Replacement  Pharmacy consulted to assist in monitoring and replacing electrolytes in this 72 y.o. female admitted on 10/15/2022 undergoing dofetilide initiation. First dofetilide dose: 10/15/22  Assessment:   Patient Exclusion Criteria: If any screening criteria checked as "Yes", then  patient  should NOT receive dofetilide until criteria item is corrected.  If "Yes" please indicate correction plan.  YES  NO Patient  Exclusion Criteria Correction Plan   [x]   []   Baseline QTc interval is greater than or equal to 440 msec. IF above YES box checked dofetilide contraindicated unless patient has ICD; then may proceed if QTc 500-550 msec or with known ventricular conduction abnormalities may proceed with QTc 550-600 msec. QTc =  449 -> EP aware    []   [x]   Patient is known or suspected to have a digoxin level greater than 2 ng/ml: No results found for: "DIGOXIN"     []   [x]   Creatinine clearance less than 20 ml/min (calculated using Cockcroft-Gault, actual body weight and serum creatinine): Estimated Creatinine Clearance: 84 mL/min (by C-G formula based on SCr of 0.72 mg/dL).     []   [x]  Patient has received drugs known to prolong the QT intervals within the last 48 hours (phenothiazines, tricyclics or tetracyclic antidepressants, erythromycin, H-1 antihistamines, cisapride, fluoroquinolones, azithromycin, ondansetron).   Updated information on QT prolonging agents is available to be searched on the following database:QT prolonging agents     []   [x]   Patient received a dose of hydrochlorothiazide (Oretic) alone or in any combination including triamterene (Dyazide, Maxzide) in the last 48 hours.    []   [x]  Patient received a medication known to increase dofetilide plasma concentrations prior to initial dofetilide dose:  Trimethoprim (Primsol, Proloprim) in the last 36 hours Verapamil (Calan, Verelan) in the  last 36 hours or a sustained release dose in the last 72 hours Megestrol (Megace) in the last 5 days  Cimetidine (Tagamet) in the last 6 hours Ketoconazole (Nizoral) in the last 24 hours Itraconazole (Sporanox) in the last 48 hours  Prochlorperazine (Compazine) in the last 36 hours     []   [x]   Patient is known to have a history of torsades de pointes; congenital or acquired long QT syndromes.    []   [x]   Patient has received a Class 1 antiarrhythmic with less than 2 half-lives since last dose. (Disopyramide, Quinidine, Procainamide, Lidocaine, Mexiletine, Flecainide, Propafenone) Last dose of flecainide was in May 2024    []   [x]   Patient has received amiodarone therapy in the past 3 months or amiodarone level is greater than 0.3 ng/ml.    Labs:    Component Value Date/Time   K 3.7 10/15/2022 1244   K 3.8 07/02/2014 0526   MG 1.8 10/15/2022 1244   MG 2.0 07/02/2014 0526     Plan: [facility] Calculated CrCl  Dose q12h  [x]  > 60 ml/min 500 mcg  []  40-60 ml/min 250 mcg  []  20-40 ml/min 125 mcg   [x]   Physician selected initial dose within range recommended for patients level of renal function - will monitor for response.  []   Physician selected initial dose outside of range recommended for patients level of renal function - will discuss if the dose should be altered at this time.   Patient has been appropriately anticoagulated with dabigatran (PTA med).  Potassium: K 3.5-3.7:  Hold Tikosyn initiation and give KCl 60 mEq po x1 and repeat BMET 2hr after dose -  repeat appropriate dose if K < 4    Magnesium: Mg 1.8-2: Give Mg 2 gm IV x1 to prevent Mg from dropping below 1.8 - do not need to recheck Mg. Appropriate to initiate Tikosyn  Thank you for allowing pharmacy to participate in this patient's care   Romie Minus, PharmD PGY1 Pharmacy Resident  Please check AMION for all [facility] Pharmacy phone numbers After 10:00 PM, call Main Pharmacy [phone]

## 2022-10-15 NOTE — Plan of Care (Signed)
  Problem: Education: Goal: Knowledge of General Education information will improve Description: Including pain rating scale, medication(s)/side effects and non-pharmacologic comfort measures Outcome: Progressing   Problem: Clinical Measurements: Goal: Ability to maintain clinical measurements within normal limits will improve Outcome: Progressing Goal: Will remain free from infection Outcome: Progressing   Problem: Nutrition: Goal: Adequate nutrition will be maintained Outcome: Progressing   

## 2022-10-15 NOTE — Addendum Note (Signed)
Encounter addended by: Eustace Pen, PA-C on: 10/15/2022 1:42 PM  Actions taken: Clinical Note Signed

## 2022-10-15 NOTE — Progress Notes (Addendum)
Patient ID: [name] Strickland, female   DOB: 12-Dec-1950, 72 y.o.   MRN: [id number]      Date:  10/15/2022   ID:  [name] Strickland 01-05-1951, MRN [id number]  PCP:  [name], MD  Cardiologist:  Dr Ladona Ridgel Primary Electrophysiologist: Dr Ladona Ridgel  History of Present Illness: [name] Strickland is a 72 y.o. female  initially being diagnosed with atrial fibrillation in 2012 after presenting with palpitations after the traumatic death of her son.  She had afib as well as a WCT and was hospitalized for several  days.  She has recently had increasing frequency and duration of atrial fibrillation despite flecainide 150mg  BID and diltiazem.  Further medical therapy is limited by bradycardia.  She has symptoms of tachypalpitations and decreased exercise tolerance.  She has DOE and when V rates are fast she is SOB at rest.  Her chads2vasc score is 2-3 (she denies DM) and is chronically anticoagulation with pradaxa.  She has required cardioversion 07/03/14 for atrial flutter. She underwent afib ablation 07/26/14.  She was seen  5/17 for  fatigue and lack of energy since the procedure.  Denied swallowing difficulties. Denied groin issues, bruising form procedure resolving. Did have increased burping but taking PPI. 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 She had 10 hours of afib early on after ablation, converted with flecainide.  Now she is staring to feel improved and some energy is returning. 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 She had one further afib episode and returned to SR with one tab of Cardizem and  50 mg flecainide after 8 hours.  On follow up 08/28/22, she is currently in Afib. Seen in ED on 08/14/22 for Afib with RVR and EP recommended discontinuing flecainide due to failure. Discharged on diltiazem 300 mg daily. She currently takes [name] 150 mg BID and prior to ED visit missed 2 doses at  different times by accident. She has a Chadsvasc score of at least 4. She has not missed any doses of [name] since 5/16.   On follow up 10/15/22, she is currently in Afib. Patient is here today for Tikosyn admission. No missed doses of [name]. No benadryl use. No new medications since pharmacist review.   Today, she denies symptoms of chest pain, orthopnea, PND, lower extremity edema, claudication, dizziness, presyncope, syncope, bleeding, or neurologic sequela. The patient is tolerating medications without difficulties and is otherwise without complaint today.    Past Medical History:  Diagnosis Date   Diabetes mellitus    pt is unaware and is on no medicine for this (A1C 5.9 2012)   Dyslipidemia    Hypertension    Overweight    Paroxysmal atrial fibrillation (HCC)    chads2vasc score of at least 2   Snoring    cancelled prior sleep study due to costs   Typical atrial flutter [facility])    Past Surgical History:  Procedure Laterality Date   APPENDECTOMY     ATRIAL FIBRILLATION ABLATION N/A 07/26/2014   Procedure: ATRIAL FIBRILLATION ABLATION;  Surgeon: [name], MD;  Location: [facility] CATH LAB;  Service: Cardiovascular;  Laterality: N/A;   BREAST LUMPECTOMY     benign   CARDIOVERSION N/A 07/03/2014   Procedure: CARDIOVERSION;  Surgeon: Luis Abed, MD;  Location: [facility] OR;  Service: Cardiovascular;  Laterality: N/A;   KNEE SURGERY  REPLACEMENT TOTAL KNEE     bilateral, different times   TEE WITHOUT CARDIOVERSION N/A 07/26/2014   Procedure: TRANSESOPHAGEAL ECHOCARDIOGRAM (TEE);  Surgeon: Lewayne Bunting, MD;  Location[facility] ENDOSCOPY;  Service: Cardiovascular;  Laterality: N/A;   TONSILLECTOMY AND ADENOIDECTOMY       Current Outpatient Medications  Medication Sig Dispense Refill   acetaminophen (TYLENOL) 650 MG CR tablet Take 650 mg by mouth at bedtime as needed for pain.     ALPRAZolam (XANAX) 0.25 MG tablet Take 1 tablet (0.25 mg total) by mouth 2 (two) times daily as needed for  anxiety. 30 tablet 5   atorvastatin (LIPITOR) 10 MG tablet Take 1 tablet (10 mg total) by mouth daily. 30 tablet 11   cyclobenzaprine (FLEXERIL) 10 MG tablet Take 0.5-1 tablets (5-10 mg total) by mouth at bedtime as needed for muscle spasms. 30 tablet 0   dabigatran ([name]) 150 MG CAPS capsule Take 1 capsule (150 mg total) by mouth 2 (two) times daily. 60 capsule 5   diltiazem (CARDIZEM CD) 300 MG 24 hr capsule Take 1 capsule (300 mg total) by mouth daily. 30 capsule 5   glipiZIDE (GLUCOTROL XL) 10 MG 24 hr tablet Take 1 tablet (10 mg total) by mouth daily. 30 tablet 11   traMADol (ULTRAM) 50 MG tablet Take 1 tablet (50 mg total) by mouth every 12 (twelve) hours as needed. 30 tablet 0   No current facility-administered medications for this visit.    Allergies:   Shellfish-derived products, Fish allergy, Iodine, Contrast media [iodinated contrast media], and Sulfonamide derivatives   Social History:  The patient  reports that she has never smoked. She has never used smokeless tobacco. She reports that she does not drink alcohol and does not use drugs.   Family History:  The patient's  family history includes Heart attack in her father and mother; Heart disease in her father; Stroke in her father.    ROS:  Please see the history of present illness.   All other systems are reviewed and negative.   Vitals: BP 124/82 HR 90 Weight 107 kg  GEN- The patient is well appearing, alert and oriented x 3 today.   Neck - no JVD or carotid bruit noted Lungs- Clear to ausculation bilaterally, normal work of breathing Heart- Irregular rate and rhythm, no murmurs, rubs or gallops, PMI not laterally displaced Extremities- no clubbing, cyanosis, or edema Skin - no rash or ecchymosis noted   Recent Labs: 11/21/2021: ALT 13 08/14/2022: BUN 7; Creatinine, Ser 0.73; Hemoglobin 14.5; Magnesium 1.8; Platelets 328; Potassium 4.4; Sodium 138   Wt Readings from Last 3 Encounters:  08/28/22 108 kg  08/14/22 108  kg  05/17/22 108 kg    ECG: Vent. rate 90 BPM PR interval * ms QRS duration 64 ms QT/QTcB 370/452 ms P-R-T axes * 8 -70 Atrial fibrillation with premature ventricular or aberrantly conducted complexes Cannot rule out Anterior infarct , age undetermined Abnormal ECG When compared with ECG of 28-Aug-2022 15:33, PREVIOUS ECG IS PRESENT  Other studies Reviewed: Epic records reviewed  ECHO 07/26/14: - Left ventricle: The cavity size was normal. Wall thickness was    normal. Systolic function was normal. The estimated ejection    fraction was in the range of 55% to 60%. Wall motion was normal;    there were no regional wall motion abnormalities. Doppler    parameters are consistent with abnormal left ventricular    relaxation (grade 1 diastolic dysfunction).  - Aortic valve: There was  no stenosis.  - Mitral valve: Mildly calcified annulus. There was trivial    regurgitation.  - Left atrium: The atrium was moderately dilated.  - Right ventricle: The cavity size was normal. Systolic function    was normal.  - Right atrium: The atrium was mildly dilated.  - Pulmonary arteries: No complete TR doppler jet so unable to    estimate PA systolic pressure.  - Inferior vena cava: The vessel was normal in size. The    respirophasic diameter changes were in the normal range (>= 50%),    consistent with normal central venous pressure.   ASSESSMENT AND PLAN:  1.  Persistent atrial fibrillation and atrial flutter S/P PVI 07/25/14  She is currently in Afib.   Patient would like to pursue dofetilide and presents for dofetilide admission. Continue [name], states no missed doses in the last 3 weeks. No recent benadryl use PharmD has screened medications QTc in SR 449 ms  Addendum: Labs resulted creat 0.72, K 3.7, mag 1.8. CrCl estimate 123 mL/min qualifies for 500 mcg BID dosage.  2. Secondary hypercoagulable state due to atrial fibrillation (Chadsvasc - 4) Continue [name] without  interruption.   Patient will present to admissions and is assigned to room 6E01.   Arta Silence, PA-C  10/15/2022 9:19 AM

## 2022-10-16 ENCOUNTER — Other Ambulatory Visit: Payer: Self-pay

## 2022-10-16 ENCOUNTER — Other Ambulatory Visit (HOSPITAL_COMMUNITY): Payer: Self-pay

## 2022-10-16 DIAGNOSIS — I480 Paroxysmal atrial fibrillation: Secondary | ICD-10-CM | POA: Diagnosis not present

## 2022-10-16 LAB — BASIC METABOLIC PANEL
Anion gap: 6 (ref 5–15)
BUN: 11 mg/dL (ref 8–23)
CO2: 25 mmol/L (ref 22–32)
Calcium: 8.4 mg/dL — ABNORMAL LOW (ref 8.9–10.3)
Chloride: 107 mmol/L (ref 98–111)
Creatinine, Ser: 1 mg/dL (ref 0.44–1.00)
GFR, Estimated: >60 mL/min (ref >60)
Glucose, Bld: 120 mg/dL — ABNORMAL HIGH (ref 70–99)
Potassium: 4 mmol/L (ref 3.5–5.1)
Sodium: 138 mmol/L (ref 135–145)

## 2022-10-16 LAB — GLUCOSE, CAPILLARY
Glucose-Capillary: 221 mg/dL — ABNORMAL HIGH (ref 70–99)
Glucose-Capillary: 237 mg/dL — ABNORMAL HIGH (ref 70–99)
Glucose-Capillary: 126 mg/dL — ABNORMAL HIGH (ref 70–99)
Glucose-Capillary: 181 mg/dL — ABNORMAL HIGH (ref 70–99)
Glucose-Capillary: 158 mg/dL — ABNORMAL HIGH (ref 70–99)

## 2022-10-16 LAB — MAGNESIUM: Magnesium: 2 mg/dL (ref 1.7–2.4)

## 2022-10-16 LAB — HEMOGLOBIN A1C
Hgb A1c MFr Bld: 8.8 % — ABNORMAL HIGH (ref 4.8–5.6)
Mean Plasma Glucose: 205.86 mg/dL

## 2022-10-16 MED ORDER — INSULIN ASPART 100 UNIT/ML IJ SOLN: 0.0000 [IU] | Freq: Every day | INTRAMUSCULAR | Status: DC

## 2022-10-16 MED ORDER — INSULIN ASPART 100 UNIT/ML IJ SOLN
0.0000 [IU] | Freq: Three times a day (TID) | INTRAMUSCULAR | Status: DC
  Administered 2022-10-16: 1 [IU] via SUBCUTANEOUS
  Administered 2022-10-16: 3 [IU] via SUBCUTANEOUS
  Administered 2022-10-17: 5 [IU] via SUBCUTANEOUS
  Administered 2022-10-17: 1 [IU] via SUBCUTANEOUS
  Administered 2022-10-17: 2 [IU] via SUBCUTANEOUS
  Administered 2022-10-18: 1 [IU] via SUBCUTANEOUS
  Administered 2022-10-18: 2 [IU] via SUBCUTANEOUS

## 2022-10-16 MED ORDER — INSULIN ASPART 100 UNIT/ML IJ SOLN: 0.0000 [IU] | Freq: Three times a day (TID) | INTRAMUSCULAR | Status: DC

## 2022-10-16 MED ORDER — SODIUM CHLORIDE 0.9 % IV SOLN: INTRAVENOUS | Status: DC

## 2022-10-16 NOTE — Progress Notes (Addendum)
Rounding Note    Patient Name: [name] Strickland Date of Encounter: 10/16/2022  [facility] EP: Dr. Ladona Ridgel   Subjective   Feels well, tolerating drug  Inpatient Medications    Scheduled Meds:  atorvastatin  10 mg Oral Daily   dabigatran  150 mg Oral Q12H   diltiazem  300 mg Oral Daily   dofetilide  500 mcg Oral BID   glipiZIDE  10 mg Oral Daily   sodium chloride flush  3 mL Intravenous Q12H   Continuous Infusions:  sodium chloride     PRN Meds: sodium chloride, acetaminophen, cyclobenzaprine, mouth rinse, sodium chloride flush   Vital Signs    Vitals:   10/15/22 1228 10/15/22 1621 10/15/22 2031 10/16/22 0630  BP: 127/78 112/82 126/80 126/77  Pulse: 79 90  70  Resp:  17  18  Temp: 97.9 F (36.6 C) 98.4 F (36.9 C)  97.9 F (36.6 C)  TempSrc: Oral Oral  Oral  SpO2: 96%   95%  Weight:      Height:        Intake/Output Summary (Last 24 hours) at 10/16/2022 0829 Last data filed at 10/16/2022 0817 Gross per 24 hour  Intake 399.05 ml  Output --  Net 399.05 ml      10/15/2022   12:00 PM 10/15/2022   11:24 AM 08/28/2022    3:25 PM  Last 3 Weights  Weight (lbs) 237 lb 1.6 oz 235 lb 12.8 oz 238 lb  Weight (kg) 107.548 kg 106.958 kg 107.956 kg      Telemetry    AFib 80's - Personally Reviewed  ECG    AFib 89bpm, QTc - Personally Reviewed with Dr. Ladona Ridgel  Physical Exam   GEN: No acute distress.   Neck: No JVD Cardiac: irreg-irreg, no murmurs, rubs, or gallops.  Respiratory: CTA b/l. GI: Soft, nontender, non-distended  MS: No edema; No deformity. Neuro:  Nonfocal  Psych: Normal affect   Labs    High Sensitivity Troponin:  No results for input(s): "TROPONINIHS" in the last 720 hours.   Chemistry Recent Labs  Lab 10/15/22 1244 10/15/22 1733 10/15/22 2337  NA 135 138 138  K 3.7 4.2 4.0  CL 104 105 107  CO2 20* 25 25  GLUCOSE 286* 144* 120*  BUN 8 9 11   CREATININE 0.72 0.72 1.00  CALCIUM 8.8* 8.9 8.4*  MG 1.8  --  2.0   GFRNONAA >60 >60 >60  ANIONGAP 11 8 6     Lipids No results for input(s): "CHOL", "TRIG", "HDL", "LABVLDL", "LDLCALC", "CHOLHDL" in the last 168 hours.  HematologyNo results for input(s): "WBC", "RBC", "HGB", "HCT", "MCV", "MCH", "MCHC", "RDW", "PLT" in the last 168 hours. Thyroid No results for input(s): "TSH", "FREET4" in the last 168 hours.  BNPNo results for input(s): "BNP", "PROBNP" in the last 168 hours.  DDimer No results for input(s): "DDIMER" in the last 168 hours.   Radiology    No results found.  Cardiac Studies   07/26/2014: TTE Left ventricle: Systolic function was normal. The estimated    ejection fraction was in the range of 55% to 60%. Wall motion was    normal; there were no regional wall motion abnormalities.  - Aortic valve: No evidence of vegetation.  - Mitral valve: No evidence of vegetation. There was mild    regurgitation.  - Left atrium: The atrium was mildly dilated. No evidence of    thrombus in the atrial cavity or appendage.  - Right atrium:  The atrium was mildly dilated. No evidence of    thrombus in the atrial cavity or appendage.  - Atrial septum: No defect or patent foramen ovale was identified.    There was an atrial septal aneurysm.  - Tricuspid valve: No evidence of vegetation.  - Pulmonic valve: No evidence of vegetation.      07/26/2014: EPS/ablation CONCLUSIONS: 1. Sinus rhythm upon presentation.   2. Rotational Angiography reveals a moderate sized left atrium with four separate pulmonary veins without evidence of pulmonary vein stenosis. 3. Successful electrical isolation and anatomical encircling of all four pulmonary veins with radiofrequency current.    4. Cavo-tricuspid isthmus ablation was performed with complete bidirectional isthmus block achieved.  5. Atrial fibrillation induced with aggressive atrial pacing, successfully cardioverted to sinus rhythm 6. 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  Slow pathway ablation was  therefore not performed today 7. No early apparent complications.  Patient Profile     72 y.o. female w/PMHx of  HTN, DM, AFib/AFlutter this year with increasing arrhythmia burden despite her flecainide, stopped for alternative AAD and admitted now for Tikosyn load   AFib hx Diagnosis goes back as far as her epic chart goes (2012) Flecainide started 2012 >> stopped May 2024 with recurrent Afib PVI/CTI ablation 07/26/2014 (Dr. Johney Frame)  Assessment & Plan    Paroxysmal AFib CHA2DS2Vasc is 3, on [name] K+ 4.0 Mag 2.0 Creat 0.96, stable QTc stable  DCCV tomorrow if not in SR, pt aware and agreeable   HTN Home meds  DM Home meds  For questions or updates, please contact [facility] Please consult [email or web address] for contact info under     Signed, Sheilah Pigeon, PA-C  10/16/2022, 8:29 AM    EP Attending  Patient seen and examined. Agree with above. The patient is doing well and is tolerating dofetilide. Her QT is acceptable. We will plan DCCV tomorrow if she has not reverted back to NSR.  Sharlot Gowda Guyla Bless,MD

## 2022-10-16 NOTE — Progress Notes (Addendum)
Post dose EKG reviewed with Dr Ladona Ridgel QTc remains acceptable   She has since coverted to SR by RN note, QTc by telemetry is , similar to the EKG Continue Tikosyn load    [name], PA-C

## 2022-10-16 NOTE — TOC Initial Note (Signed)
Transition of Care [facility]) - Initial/Assessment Note    Patient Details  Name: [name] Strickland MRN: [id number] Date of Birth: 28-Jul-1950  Transition of Care [facility]) CM/SW Contact:    Durenda Guthrie, RN Phone Number: 10/16/2022, 8:49 AM  Clinical Narrative:                 72 yr old female from home, in with afib. Receiving Tikosyn loading dose. Per Pharmacy benefit check patient's copay for Tikosyn is $5.00. TOc Team will continue to follow.         Patient Goals and CMS Choice            Expected Discharge Plan and Services                                              Prior Living Arrangements/Services                       Activities of Daily Living      Permission Sought/Granted                  Emotional Assessment              Admission diagnosis:  Paroxysmal atrial fibrillation [facility]) [I48.0] Patient Active Problem List   Diagnosis Date Noted   Paroxysmal atrial fibrillation (HCC) 10/15/2022   Hypercoagulable state due to atrial fibrillation (HCC) 08/28/2022   Pain, dental 11/28/2021   Hyperlipidemia associated with type 2 diabetes mellitus (HCC) 11/27/2021   Type 2 diabetes mellitus with hyperglycemia, without long-term current use of insulin (HCC) 11/30/2020   Low back strain, initial encounter 04/29/2016   Essential hypertension 07/05/2014   Class 2 severe obesity due to excess calories with serious comorbidity and body mass index (BMI) of 38.0 to 38.9 in adult (HCC) 07/05/2014   Sleep apnea 05/04/2013   Adverse reaction to influenza vaccine 12/10/2012   Atrial fibrillation (HCC) 01/02/2009   PCP:  [name], MD Pharmacy:   [facility] - [place], [place] - [phone], [address] [place] [address] Phone: [phone] Fax: [phone]  [facility] [address] [place] [address] Phone: [phone] Fax:  [phone]     Social Determinants of Health (SDOH) Social History: SDOH Screenings   Depression (PHQ2-9): Low Risk  (11/30/2020)  Tobacco Use: Low Risk  (10/15/2022)   SDOH Interventions:     Readmission Risk Interventions     No data to display

## 2022-10-16 NOTE — Inpatient Diabetes Management (Signed)
Inpatient Diabetes Program Recommendations  AACE/ADA: New Consensus Statement on Inpatient Glycemic Control   Target Ranges:  Prepandial:   less than 140 mg/dL      Peak postprandial:   less than 180 mg/dL (1-2 hours)      Critically ill patients:  140 - 180 mg/dL    Latest Reference Range & Units 10/15/22 16:24 10/16/22 07:28  Glucose-Capillary 70 - 99 mg/dL 161 (H) 096 (H)   Review of Glycemic Control  Diabetes history: DM2 Outpatient Diabetes medications: Glipizide XL 10 mg daily Current orders for Inpatient glycemic control: Glipizide XL 10 mg daily  Inpatient Diabetes Program Recommendations:    Insulin: While inpatient, please consider using 'Glycemic Control' order set to order CBGs AC&HS and Novolog 0-9 units TID with meals and Novolog 0-5 units at bedtime.   Thanks, Orlando Penner, RN, MSN, CDCES Diabetes Coordinator Inpatient Diabetes Program [phone] (Team Pager from 8am to 5pm)

## 2022-10-16 NOTE — TOC Benefit Eligibility Note (Signed)
Pharmacy Patient Advocate Encounter  Insurance verification completed.    The patient is insured through [employer]  Ran test claim for dofetilide (Tikosyn) 500 mcg capsules and the current 30 day co-pay is $5.00.   This test claim was processed through [facility]- copay amounts may vary at other pharmacies due to pharmacy/plan contracts, or as the patient moves through the different stages of their insurance plan.    Roland Earl, CPHT Pharmacy Patient Advocate Specialist [facility] Pharm[facility] Patient Advocate Team Direct Number: [phone]  Fax: [phone]

## 2022-10-16 NOTE — Progress Notes (Signed)
Pt converted to SB in 50s after a pause.  Hinton Dyer, RN

## 2022-10-16 NOTE — Progress Notes (Signed)
Pharmacy: Dofetilide (Tikosyn) - Follow Up Assessment and Electrolyte Replacement  Pharmacy consulted to assist in monitoring and replacing electrolytes in this 72 y.o. female admitted on 10/15/2022 undergoing dofetilide initiation. First dofetilide dose: 7/16  Labs:    Component Value Date/Time   K 4.0 10/15/2022 2337   K 3.8 07/02/2014 0526   MG 2.0 10/15/2022 2337   MG 2.0 07/02/2014 0526     Plan: Potassium: K >/= 4: No additional supplementation needed  Magnesium: Mg > 2: No additional supplementation needed     Thank you for allowing pharmacy to participate in this patient's care   Alphia Moh, PharmD, BCPS, BCCP Clinical Pharmacist  Please check AMION for all [facility] Pharmacy phone numbers After 10:00 PM, call Main Pharmacy [phone]

## 2022-10-16 NOTE — Plan of Care (Signed)
  Problem: Education: Goal: Knowledge of General Education information will improve Description: Including pain rating scale, medication(s)/side effects and non-pharmacologic comfort measures Outcome: Progressing   Problem: Clinical Measurements: Goal: Ability to maintain clinical measurements within normal limits will improve Outcome: Progressing Goal: Will remain free from infection Outcome: Progressing Goal: Diagnostic test results will improve Outcome: Progressing Goal: Respiratory complications will improve Outcome: Progressing Goal: Cardiovascular complication will be avoided Outcome: Progressing   Problem: Nutrition: Goal: Adequate nutrition will be maintained Outcome: Progressing   Problem: Safety: Goal: Ability to remain free from injury will improve Outcome: Progressing   

## 2022-10-17 ENCOUNTER — Encounter (HOSPITAL_COMMUNITY)
Admission: AD | Disposition: A | Discharge status: Home / Self Care | Payer: Self-pay | Source: Ambulatory Visit | Attending: Internal Medicine

## 2022-10-17 ENCOUNTER — Other Ambulatory Visit: Payer: Self-pay

## 2022-10-17 ENCOUNTER — Encounter (HOSPITAL_COMMUNITY): Payer: Self-pay

## 2022-10-17 ENCOUNTER — Inpatient Hospital Stay (HOSPITAL_COMMUNITY): Admit: 2022-10-17 | Payer: Commercial Managed Care - PPO | Admitting: Cardiology

## 2022-10-17 ENCOUNTER — Encounter (HOSPITAL_COMMUNITY): Payer: Self-pay | Admitting: Cardiology

## 2022-10-17 DIAGNOSIS — I480 Paroxysmal atrial fibrillation: Secondary | ICD-10-CM | POA: Diagnosis not present

## 2022-10-17 LAB — BASIC METABOLIC PANEL
Anion gap: 9 (ref 5–15)
BUN: 17 mg/dL (ref 8–23)
CO2: 28 mmol/L (ref 22–32)
Calcium: 9.2 mg/dL (ref 8.9–10.3)
Chloride: 103 mmol/L (ref 98–111)
Creatinine, Ser: 0.94 mg/dL (ref 0.44–1.00)
GFR, Estimated: >60 mL/min (ref >60)
Glucose, Bld: 132 mg/dL — ABNORMAL HIGH (ref 70–99)
Potassium: 4.9 mmol/L (ref 3.5–5.1)
Sodium: 140 mmol/L (ref 135–145)

## 2022-10-17 LAB — GLUCOSE, CAPILLARY
Glucose-Capillary: 182 mg/dL — ABNORMAL HIGH (ref 70–99)
Glucose-Capillary: 270 mg/dL — ABNORMAL HIGH (ref 70–99)
Glucose-Capillary: 136 mg/dL — ABNORMAL HIGH (ref 70–99)
Glucose-Capillary: 129 mg/dL — ABNORMAL HIGH (ref 70–99)

## 2022-10-17 LAB — MAGNESIUM: Magnesium: 2.1 mg/dL (ref 1.7–2.4)

## 2022-10-17 SURGERY — CARDIOVERSION
Anesthesia: General

## 2022-10-17 MED ORDER — DOFETILIDE 250 MCG PO CAPS
250.0000 ug | ORAL_CAPSULE | Freq: Two times a day (BID) | ORAL | Status: DC
  Administered 2022-10-17 – 2022-10-18 (×2): 250 ug via ORAL
  Filled 2022-10-17 (×2): qty 1

## 2022-10-17 MED ORDER — MAGNESIUM SULFATE IN D5W 1-5 GM/100ML-% IV SOLN
1.0000 g | Freq: Once | INTRAVENOUS | Status: AC
  Administered 2022-10-17: 1 g via INTRAVENOUS
  Filled 2022-10-17: qty 100

## 2022-10-17 NOTE — Progress Notes (Addendum)
Rounding Note    Patient Name: [name] Strickland Date of Encounter: 10/17/2022  [facility] EP: Dr. Ladona Ridgel   Subjective   Poor sleep, monitor kept ringing.  Otherwise OK  Inpatient Medications    Scheduled Meds:  atorvastatin  10 mg Oral Daily   dabigatran  150 mg Oral Q12H   diltiazem  300 mg Oral Daily   dofetilide  500 mcg Oral BID   glipiZIDE  10 mg Oral Daily   insulin aspart  0-5 Units Subcutaneous QHS   insulin aspart  0-9 Units Subcutaneous TID WC   sodium chloride flush  3 mL Intravenous Q12H   Continuous Infusions:  sodium chloride     sodium chloride     PRN Meds: sodium chloride, acetaminophen, cyclobenzaprine, mouth rinse, sodium chloride flush   Vital Signs    Vitals:   10/16/22 1121 10/16/22 1934 10/16/22 2221 10/17/22 0410  BP: 134/73 (!) 123/100 136/65 (!) 107/54  Pulse:  63 62 (!) 53  Resp: 18 20  16   Temp: 97.8 F (36.6 C) 98.7 F (37.1 C)  98.7 F (37.1 C)  TempSrc: Oral Oral  Oral  SpO2: 98% 97%  98%  Weight:      Height:        Intake/Output Summary (Last 24 hours) at 10/17/2022 0728 Last data filed at 10/16/2022 2242 Gross per 24 hour  Intake 158 ml  Output --  Net 158 ml      10/15/2022   12:00 PM 10/15/2022   11:24 AM 08/28/2022    3:25 PM  Last 3 Weights  Weight (lbs) 237 lb 1.6 oz 235 lb 12.8 oz 238 lb  Weight (kg) 107.548 kg 106.958 kg 107.956 kg      Telemetry    SB/SR nocturnal rates 40's-50's, otherwise 50's-60's - Personally Reviewed  ECG    SR 62bpm, QTc - Personally Reviewed with Dr. Ladona Ridgel  Physical Exam   GEN: No acute distress.   Neck: No JVD Cardiac: RRR, no murmurs, rubs, or gallops.  Respiratory: CTA b/l. GI: Soft, nontender, non-distended  MS: No edema; No deformity. Neuro:  Nonfocal  Psych: Normal affect   Labs    High Sensitivity Troponin:  No results for input(s): "TROPONINIHS" in the last 720 hours.   Chemistry Recent Labs  Lab 10/15/22 1244 10/15/22 1733 10/15/22 2337  10/17/22 0027  NA 135 138 138 140  K 3.7 4.2 4.0 4.9  CL 104 105 107 103  CO2 20* 25 25 28   GLUCOSE 286* 144* 120* 132*  BUN 8 9 11 17   CREATININE 0.72 0.72 1.00 0.94  CALCIUM 8.8* 8.9 8.4* 9.2  MG 1.8  --  2.0 2.1  GFRNONAA >60 >60 >60 >60  ANIONGAP 11 8 6 9     Lipids No results for input(s): "CHOL", "TRIG", "HDL", "LABVLDL", "LDLCALC", "CHOLHDL" in the last 168 hours.  HematologyNo results for input(s): "WBC", "RBC", "HGB", "HCT", "MCV", "MCH", "MCHC", "RDW", "PLT" in the last 168 hours. Thyroid No results for input(s): "TSH", "FREET4" in the last 168 hours.  BNPNo results for input(s): "BNP", "PROBNP" in the last 168 hours.  DDimer No results for input(s): "DDIMER" in the last 168 hours.   Radiology    No results found.  Cardiac Studies   07/26/2014: TTE Left ventricle: Systolic function was normal. The estimated    ejection fraction was in the range of 55% to 60%. Wall motion was    normal; there were no regional wall motion abnormalities.  -  Aortic valve: No evidence of vegetation.  - Mitral valve: No evidence of vegetation. There was mild    regurgitation.  - Left atrium: The atrium was mildly dilated. No evidence of    thrombus in the atrial cavity or appendage.  - Right atrium: The atrium was mildly dilated. No evidence of    thrombus in the atrial cavity or appendage.  - Atrial septum: No defect or patent foramen ovale was identified.    There was an atrial septal aneurysm.  - Tricuspid valve: No evidence of vegetation.  - Pulmonic valve: No evidence of vegetation.      07/26/2014: EPS/ablation CONCLUSIONS: 1. Sinus rhythm upon presentation.   2. Rotational Angiography reveals a moderate sized left atrium with four separate pulmonary veins without evidence of pulmonary vein stenosis. 3. Successful electrical isolation and anatomical encircling of all four pulmonary veins with radiofrequency current.    4. Cavo-tricuspid isthmus ablation was performed with  complete bidirectional isthmus block achieved.  5. Atrial fibrillation induced with aggressive atrial pacing, successfully cardioverted to sinus rhythm 6. 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  Slow pathway ablation was therefore not performed today 7. No early apparent complications.  Patient Profile     72 y.o. female w/PMHx of  HTN, DM, AFib/AFlutter this year with increasing arrhythmia burden despite her flecainide, stopped for alternative AAD and admitted now for Tikosyn load   AFib hx Diagnosis goes back as far as her epic chart goes (2012) Flecainide started 2012 >> stopped May 2024 with recurrent Afib PVI/CTI ablation 07/26/2014 (Dr. Johney Frame)  Assessment & Plan    Paroxysmal AFib CHA2DS2Vasc is 3, on [name] K+ 4.9 Mag 2.1 Creat 0.94, stable QTc stable  She has converted with drug Nocturnal bradycardia, otherwise rates mostly in the 50's-60's in SR, no change in dilt today, follow as her sinus node wakes up Anticipate discharge tomorrow   HTN Home meds  DM Home meds   For questions or updates, please contact [facility] Please consult [email or web address] for contact info under     Signed, Sheilah Pigeon, PA-C  10/17/2022, 7:28 AM    EP Attending  Patient seen and examined. Agree with above. She is well appearing and in NAD. Lungs are clear. Cv with a RRR. Tele with NSR. We will reduce the dose of dofetilide to 250 bid as her QT later today got too long. Hopefully she will maintain NSR.  Sharlot Gowda Isidora Laham,MD

## 2022-10-17 NOTE — Progress Notes (Signed)
Pharmacy: Dofetilide (Tikosyn) - Follow Up Assessment and Electrolyte Replacement  Pharmacy consulted to assist in monitoring and replacing electrolytes in this 72 y.o. female admitted on 10/15/2022 undergoing dofetilide initiation. First dofetilide dose: 7/16  Labs:    Component Value Date/Time   K 4.9 10/17/2022 0027   K 3.8 07/02/2014 0526   MG 2.1 10/17/2022 0027   MG 2.0 07/02/2014 0526     Plan: Potassium: K >/= 4: No additional supplementation needed  Magnesium: Mg > 2: No additional supplementation needed     Thank you for allowing pharmacy to participate in this patient's care  Gwynn Burly, PharmD  Please check AMION for all [facility] phone numbers After 10:00 PM, call Main Pharmacy [phone]

## 2022-10-17 NOTE — Plan of Care (Signed)

## 2022-10-17 NOTE — Progress Notes (Signed)
Inpatient Diabetes Program Recommendations  AACE/ADA: New Consensus Statement on Inpatient Glycemic Control (2015)  Target Ranges:  Prepandial:   less than 140 mg/dL      Peak postprandial:   less than 180 mg/dL (1-2 hours)      Critically ill patients:  140 - 180 mg/dL   Lab Results  Component Value Date   GLUCAP 270 (H) 10/17/2022   HGBA1C 8.8 (H) 10/16/2022    Review of Glycemic Control  Latest Reference Range & Units 10/16/22 11:19 10/16/22 16:24 10/16/22 20:53 10/16/22 22:28 10/17/22 07:42 10/17/22 11:24  Glucose-Capillary 70 - 99 mg/dL 409 (H) 811 (H) 914 (H) 158 (H) 182 (H) 270 (H)   Diabetes history: DM 2 Outpatient Diabetes medications:  Glucotrol XL 10 mg daily Current orders for Inpatient glycemic control:  Novolog 0-9 units tid with meals and HS Glucotrol XL 10 mg daily  Inpatient Diabetes Program Recommendations:    While in the hospital, consider adding Semglee 10 units daily.  A1C indicates that patient needs better control of blood sugars and close f/u with PCP.   Thanks,  Beryl Meager, RN, BC-ADM Inpatient Diabetes Coordinator Pager [phone]  (8a-5p)

## 2022-10-17 NOTE — Progress Notes (Signed)
Post dose EKG is reviewed with Dr. Ladona Ridgel Telemetry reviewed and discussed QTc has prolonged, will reduce dose for tonight to Intermittent accel junctional rhythm, long 1st degree Avblock HRs though stable mostly 50's >> 60's  [name], PA-C

## 2022-10-18 ENCOUNTER — Other Ambulatory Visit (HOSPITAL_COMMUNITY): Payer: Self-pay

## 2022-10-18 DIAGNOSIS — I480 Paroxysmal atrial fibrillation: Secondary | ICD-10-CM | POA: Diagnosis not present

## 2022-10-18 LAB — GLUCOSE, CAPILLARY
Glucose-Capillary: 158 mg/dL — ABNORMAL HIGH (ref 70–99)
Glucose-Capillary: 141 mg/dL — ABNORMAL HIGH (ref 70–99)

## 2022-10-18 LAB — BASIC METABOLIC PANEL
Anion gap: 11 (ref 5–15)
BUN: 14 mg/dL (ref 8–23)
CO2: 27 mmol/L (ref 22–32)
Calcium: 9.6 mg/dL (ref 8.9–10.3)
Chloride: 101 mmol/L (ref 98–111)
Creatinine, Ser: 0.74 mg/dL (ref 0.44–1.00)
GFR, Estimated: >60 mL/min (ref >60)
Glucose, Bld: 123 mg/dL — ABNORMAL HIGH (ref 70–99)
Potassium: 4.2 mmol/L (ref 3.5–5.1)
Sodium: 139 mmol/L (ref 135–145)

## 2022-10-18 LAB — MAGNESIUM: Magnesium: 2.2 mg/dL (ref 1.7–2.4)

## 2022-10-18 MED ORDER — DOFETILIDE 250 MCG PO CAPS
250.0000 ug | ORAL_CAPSULE | Freq: Two times a day (BID) | ORAL | 5 refills | Status: DC
  Filled 2022-10-18: qty 60, 30d supply, fill #0

## 2022-10-18 NOTE — Plan of Care (Signed)

## 2022-10-18 NOTE — Discharge Summary (Cosign Needed)
ELECTROPHYSIOLOGY PROCEDURE DISCHARGE SUMMARY    Patient ID: [name] Strickland,  MRN: [id number], DOB/AGE: January 24, 1951 72 y.o.  Admit date: 10/15/2022 Discharge date: 10/18/2022  Primary Care Physician: [name], MD Electrophysiologist: Dr. Ladona Ridgel  Primary Discharge Diagnosis:  1.  paroxysmal atrial fibrillation status post Tikosyn loading this admission      CHA2DS2Vasc is 3, on [name]  Secondary Discharge Diagnosis:  HTN DM   Allergies  Allergen Reactions   Shellfish-Derived Products Itching and Swelling        Fish Allergy Itching and Swelling   Iodine Other (See Comments)    Unknown childhood reaction to topical iodine   Contrast Media [Iodinated Contrast Media] Other (See Comments)    Unknown reaction to topical iodine as a child   Sulfonamide Derivatives Itching and Rash     Procedures This Admission:  1.  Tikosyn loading   Brief HPI: [name] Strickland is a 72 y.o. female with a past medical history as noted above.  She is followed by EP in the outpatient setting for treatment options of atrial fibrillation.  Risks, benefits, and alternatives to Tikosyn were reviewed with the patient who wished to proceed.    Hospital Course:  The patient was admitted and Tikosyn was initiated.  Renal function and electrolytes were followed during the hospitalization.  The patient's QTc remained stable.  She converted with drug and did not require DCCV  She was monitored until discharge on telemetry which demonstrated AFib > SB/intermittent accel junctional without AV block and no significant bradycardia > prompted discontinuation of her diltiazem >> SB/SR 50's-70's.  On the day of discharge, she feels well, was examined by Dr Ladona Ridgel who considered the patient stable for discharge to home.  Follow-up has been arranged with the AFib clinic in 1 week and the EP service in 4 weeks.   Tikosyn teaching was completed No new or additional electrolyte replacement for home   Physical  Exam: Vitals:   10/17/22 0410 10/17/22 1608 10/17/22 1939 10/18/22 0405  BP: (!) 107/54 (!) 122/48 132/66 (!) 118/55  Pulse: (!) 53  62 61  Resp: 16 18 20 14   Temp: 98.7 F (37.1 C) 98 F (36.7 C) 98.7 F (37.1 C) 98.1 F (36.7 C)  TempSrc: Oral Oral Oral Oral  SpO2: 98%  100% 97%  Weight:      Height:         GEN- The patient is well appearing, alert and oriented x 3 today.   HEENT: normocephalic, atraumatic; sclera clear, conjunctiva pink; hearing intact; oropharynx clear; neck supple, no JVP, poor dentition Lymph- no cervical lymphadenopathy Lungs- CTA b/l, normal work of breathing.  No wheezes, rales, rhonchi Heart- RRR, no murmurs, rubs or gallops, PMI not laterally displaced GI- soft, non-tender, non-distended Extremities- no clubbing, cyanosis, or edema MS- no significant deformity or atrophy Skin- warm and dry, no rash or lesion Psych- euthymic mood, full affect Neuro- strength and sensation are intact   Labs:   Lab Results  Component Value Date   WBC 7.9 08/14/2022   HGB 14.5 08/14/2022   HCT 44.2 08/14/2022   MCV 84.7 08/14/2022   PLT 328 08/14/2022    Recent Labs  Lab 10/18/22 1229  NA 139  K 4.2  CL 101  CO2 27  BUN 14  CREATININE 0.74  CALCIUM 9.6  GLUCOSE 123*     Discharge Medications:  Allergies as of 10/18/2022       Reactions   Shellfish-derived  Products Itching, Swelling      Fish Allergy Itching, Swelling   Iodine Other (See Comments)   Unknown childhood reaction to topical iodine   Contrast Media [iodinated Contrast Media] Other (See Comments)   Unknown reaction to topical iodine as a child   Sulfonamide Derivatives Itching, Rash        Medication List     STOP taking these medications    diltiazem 300 MG 24 hr capsule Commonly known as: Cardizem CD       TAKE these medications    acetaminophen 650 MG CR tablet Commonly known as: TYLENOL Take 650 mg by mouth at bedtime as needed for pain.   ALPRAZolam 0.25 MG  tablet Commonly known as: XANAX Take 1 tablet (0.25 mg total) by mouth 2 (two) times daily as needed for anxiety.   atorvastatin 10 MG tablet Commonly known as: LIPITOR Take 1 tablet (10 mg total) by mouth daily. What changed: when to take this   cyclobenzaprine 10 MG tablet Commonly known as: FLEXERIL Take 0.5-1 tablets (5-10 mg total) by mouth at bedtime as needed for muscle spasms.   dabigatran 150 MG Caps capsule Commonly known as: [name] Take 1 capsule (150 mg total) by mouth 2 (two) times daily.   dofetilide 250 MCG capsule Commonly known as: TIKOSYN Take 1 capsule (250 mcg total) by mouth 2 (two) times daily.   glipiZIDE 10 MG 24 hr tablet Commonly known as: GLUCOTROL XL Take 1 tablet (10 mg total) by mouth daily.   traMADol 50 MG tablet Commonly known as: ULTRAM Take 1 tablet (50 mg total) by mouth every 12 (twelve) hours as needed.        Disposition: home Discharge Instructions     Diet - low sodium heart healthy   Complete by: As directed    Increase activity slowly   Complete by: As directed        Follow-up Information     [name], MD Follow up.   Specialty: Family Medicine Why: Follow up 7-10 days post hospital Contact information: [address] [place] [address] [phone]         [facility] Follow up.   Specialty: Cardiology Why: North tower to the 6th floor  of main hospital They will  call you with appt on your mobile as the computers are currently down. Contact information: [address] [place] [address] [phone]                Duration of Discharge Encounter: Greater than 30 minutes including physician time.  Norma Fredrickson, PA-C 10/18/2022 2:30 PM  EP Attending  Patient seen and examined. Agree with above. The patient's QT interval looks good today, with her dose decreased to 250 mcg bid. She will be discharged  home with usual followup as noted above.   Sharlot Gowda Gearldean Lomanto,MD

## 2022-10-18 NOTE — Progress Notes (Signed)
Pharmacy: Dofetilide (Tikosyn) - Follow Up Assessment and Electrolyte Replacement  Pharmacy consulted to assist in monitoring and replacing electrolytes in this 72 y.o. female admitted on 10/15/2022 undergoing dofetilide initiation. First dofetilide dose: 7/16  Labs:    Component Value Date/Time   K 4.2 10/18/2022 1229   K 3.8 07/02/2014 0526   MG 2.2 10/18/2022 1229   MG 2.0 07/02/2014 0526     Plan: Potassium: K >/= 4: No additional supplementation needed  Magnesium: Mg > 2: No additional supplementation needed     Thank you for allowing pharmacy to participate in this patient's care  Gwynn Burly, PharmD  Please check AMION for all [facility] Pharmacy phone numbers After 10:00 PM, call Main Pharmacy [phone]

## 2022-10-18 NOTE — Progress Notes (Signed)
Telemetry and EKG reviewed by Dr. Ladona Ridgel QTc is improved No no going V ectpy or junctional rhythm. Labs are pending  Anticipate discharge later today pending labs/final EKG Will continue off diltiazem  [name], PA-C

## 2022-10-18 NOTE — TOC Transition Note (Signed)
Transition of Care [facility]) - CM/SW Discharge Note   Patient Details  Name: [name] Strickland MRN: [id number] Date of Birth: 1950-10-23  [facility] of [facility]) CM/SW Contact:  Lockie Pares, RN Phone Number: 10/18/2022, 8:33 AM   Clinical Narrative:     Called patient to discuss DC and appointments. Patient will make PCP appt. Called afib clinic to schedule for one week. They will call patient back with appointment Plan to DC today.    Barriers to Discharge: No Barriers Identified   Patient Goals and CMS Choice      Discharge Placement    Home self care                     Discharge Plan and Services Additional resources added to the After Visit Summary for                                       Social Determinants of Health (SDOH) Interventions SDOH Screenings   Food Insecurity: No Food Insecurity (10/17/2022)  Housing: Low Risk  (10/17/2022)  Transportation Needs: No Transportation Needs (10/17/2022)  Utilities: Not At Risk (10/17/2022)  Depression (PHQ2-9): Low Risk  (11/30/2020)  Tobacco Use: Low Risk  (10/17/2022)     Readmission Risk Interventions     No data to display

## 2022-10-21 ENCOUNTER — Telehealth: Payer: Self-pay

## 2022-10-21 NOTE — Transitions of Care (Post Inpatient/ED Visit) (Unsigned)
   10/21/2022  Name: [name] Strickland MRN: [id number] DOB: [birth year]  Today's TOC FU Call Status: Today's TOC FU Call Status:: Unsuccessul Call (1st Attempt) Unsuccessful Call (1st Attempt) Date: 10/21/22  Attempted to reach the patient regarding the most recent Inpatient/ED visit.  Follow Up Plan: Additional outreach attempts will be made to reach the patient to complete the Transitions of Care (Post Inpatient/ED visit) call.   Signature   Woodfin Ganja LPN [facility] Nurse Health Advisor Direct Dial [phone]

## 2022-10-22 NOTE — Transitions of Care (Post Inpatient/ED Visit) (Signed)
   10/22/2022  Name: ERSIE SAVINO MRN: [id number] DOB: 1950-10-19  Today's TOC FU Call Status: Today's TOC FU Call Status:: Successful TOC FU Call Competed Unsuccessful Call (1st Attempt) Date: 10/21/22 Unsuccessful Call (2nd Attempt) Date: 10/22/22 [facility] FU Call Complete Date: 10/22/22  Attempted to reach the patient regarding the most recent Inpatient/ED visit.  Follow Up Plan: No further outreach attempts will be made at this time. We have been unable to contact the patient.  Signature   Woodfin Ganja LPN [facility] Nurse Health Advisor Direct Dial [phone]

## 2022-10-25 ENCOUNTER — Encounter (HOSPITAL_COMMUNITY): Payer: Self-pay | Admitting: Physician Assistant

## 2022-10-25 ENCOUNTER — Other Ambulatory Visit: Payer: Self-pay

## 2022-10-25 ENCOUNTER — Ambulatory Visit (HOSPITAL_COMMUNITY)
Admit: 2022-10-25 | Discharge: 2022-10-25 | Disposition: A | Discharge status: Home / Self Care | Payer: Commercial Managed Care - PPO | Attending: Physician Assistant | Admitting: Physician Assistant

## 2022-10-25 VITALS — BP 122/76 | HR 70 | Ht 68.0 in | Wt 232.6 lb

## 2022-10-25 DIAGNOSIS — Z5181 Encounter for therapeutic drug level monitoring: Secondary | ICD-10-CM

## 2022-10-25 DIAGNOSIS — Z7901 Long term (current) use of anticoagulants: Secondary | ICD-10-CM | POA: Diagnosis not present

## 2022-10-25 DIAGNOSIS — I10 Essential (primary) hypertension: Secondary | ICD-10-CM | POA: Diagnosis not present

## 2022-10-25 DIAGNOSIS — Z79899 Other long term (current) drug therapy: Secondary | ICD-10-CM

## 2022-10-25 DIAGNOSIS — I4891 Unspecified atrial fibrillation: Secondary | ICD-10-CM | POA: Diagnosis present

## 2022-10-25 DIAGNOSIS — I4892 Unspecified atrial flutter: Secondary | ICD-10-CM | POA: Diagnosis not present

## 2022-10-25 DIAGNOSIS — D6869 Other thrombophilia: Secondary | ICD-10-CM | POA: Insufficient documentation

## 2022-10-25 DIAGNOSIS — I4819 Other persistent atrial fibrillation: Secondary | ICD-10-CM | POA: Diagnosis not present

## 2022-10-25 LAB — BASIC METABOLIC PANEL
Anion gap: 10 (ref 5–15)
BUN: 6 mg/dL — ABNORMAL LOW (ref 8–23)
CO2: 24 mmol/L (ref 22–32)
Calcium: 9.2 mg/dL (ref 8.9–10.3)
Chloride: 104 mmol/L (ref 98–111)
Creatinine, Ser: 0.67 mg/dL (ref 0.44–1.00)
GFR, Estimated: >60 mL/min (ref >60)
Glucose, Bld: 130 mg/dL — ABNORMAL HIGH (ref 70–99)
Potassium: 4.1 mmol/L (ref 3.5–5.1)
Sodium: 138 mmol/L (ref 135–145)

## 2022-10-25 LAB — MAGNESIUM: Magnesium: 2.1 mg/dL (ref 1.7–2.4)

## 2022-10-25 MED ORDER — DOFETILIDE 250 MCG PO CAPS
250.0000 ug | ORAL_CAPSULE | Freq: Two times a day (BID) | ORAL | 2 refills | Status: DC
  Filled 2022-10-25 – 2022-11-08 (×2): qty 180, 90d supply, fill #0
  Filled 2023-02-17: qty 180, 90d supply, fill #1
  Filled 2023-05-23: qty 180, 90d supply, fill #2

## 2022-10-25 NOTE — Progress Notes (Signed)
Date:  10/25/2022  ID:  [name], Strickland [birth year], MRN [id number] PCP:  [name], MD  Cardiologist:  Dr Ladona Ridgel Primary Electrophysiologist: Dr Ladona Ridgel  History of Present Illness: [name] Strickland is a 72 y.o. female  initially being diagnosed with atrial fibrillation in 2012 after presenting with palpitations after the traumatic death of her son.  She had afib as well as a WCT and was hospitalized for several  days.  She has recently had increasing frequency and duration of atrial fibrillation despite flecainide 150mg  BID and diltiazem.  Further medical therapy is limited by bradycardia.  She has symptoms of tachypalpitations and decreased exercise tolerance.  She has DOE and when V rates are fast she is SOB at rest.  Her chads2vasc score is 2-3 (she denies DM) and is chronically anticoagulation with pradaxa.  She has required cardioversion 07/03/14 for atrial flutter. She underwent afib ablation 07/26/14.  She was seen  5/17 for  fatigue and lack of energy since the procedure.  Denied swallowing difficulties. Denied groin issues, bruising form procedure resolving. Did have increased burping but taking PPI. 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 She had 10 hours of afib early on after ablation, converted with flecainide.  Now she is staring to feel improved and some energy is returning. 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 She had one further afib episode and returned to SR with one tab of Cardizem and  50 mg flecainide after 8 hours.  On follow up 08/28/22, she is currently in Afib. Seen in ED on 08/14/22 for Afib with RVR and EP recommended discontinuing flecainide due to failure. Discharged on diltiazem 300 mg daily. She currently takes [name] 150 mg BID and prior to ED visit missed 2 doses at different times by accident. She has a Chadsvasc score of at least 4. She has not  missed any doses of [name] since 5/16.   On follow up 10/15/22, she is currently in Afib. Patient is here today for Tikosyn admission. No missed doses of [name]. No benadryl use. No new medications since pharmacist review.   Follow up 10/25/22, patient is s/p dofetilide admission 7/16-7/19/24. She converted to SR with the medication and did not require DCCV. Her diltiazem was discontinued due to intermittent junctional rhythm. She reports that she feels well today in SR. She feels that she is slowly "getting back to her old self".   Today, she denies symptoms of palpitations, chest pain, orthopnea, PND, lower extremity edema, claudication, dizziness, presyncope, syncope, bleeding, or neurologic sequela. The patient is tolerating medications without difficulties and is otherwise without complaint today.    Past Medical History:  Diagnosis Date   Diabetes mellitus    pt is unaware and is on no medicine for this (A1C 5.9 2012)   Dyslipidemia    Hypertension    Overweight    Paroxysmal atrial fibrillation (HCC)    chads2vasc score of at least 2   Snoring    cancelled prior sleep study due to costs   Typical atrial flutter (HCC)     Current Outpatient Medications  Medication Sig Dispense Refill   acetaminophen (TYLENOL) 650 MG CR tablet Take 650 mg by mouth at bedtime as needed for pain.     ALPRAZolam (XANAX) 0.25 MG tablet Take 1 tablet (0.25 mg total) by mouth 2 (two) times  daily as needed for anxiety. 30 tablet 5   atorvastatin (LIPITOR) 10 MG tablet Take 1 tablet (10 mg total) by mouth daily. (Patient taking differently: Take 10 mg by mouth at bedtime.) 30 tablet 11   cyclobenzaprine (FLEXERIL) 10 MG tablet Take 0.5-1 tablets (5-10 mg total) by mouth at bedtime as needed for muscle spasms. 30 tablet 0   dabigatran ([name]) 150 MG CAPS capsule Take 1 capsule (150 mg total) by mouth 2 (two) times daily. 60 capsule 5   glipiZIDE (GLUCOTROL XL) 10 MG 24 hr tablet Take 1 tablet (10 mg  total) by mouth daily. 30 tablet 11   traMADol (ULTRAM) 50 MG tablet Take 1 tablet (50 mg total) by mouth every 12 (twelve) hours as needed. 30 tablet 0   dofetilide (TIKOSYN) 250 MCG capsule Take 1 capsule (250 mcg total) by mouth 2 (two) times daily. 180 capsule 2   No current facility-administered medications for this encounter.     ROS:  Please see the history of present illness.   All other systems are reviewed and negative.   Vital Signs Blood pressure 122/76, pulse 70, height 5\' 8"  (1.727 m), weight 105.5 kg.    GEN: Well nourished, well developed in no acute distress NECK: No JVD; No carotid bruits CARDIAC: Regular rate and rhythm, no murmurs, rubs, gallops RESPIRATORY:  Clear to auscultation without rales, wheezing or rhonchi  ABDOMEN: Soft, non-tender, non-distended EXTREMITIES:  No edema; No deformity    Wt Readings from Last 3 Encounters:  10/25/22 105.5 kg  10/15/22 107.5 kg  10/15/22 107 kg    ECG today demonstrates SR Vent. rate 70 BPM PR interval 168 ms QRS duration 66 ms QT/QTcB 416/449 ms   ECHO 07/26/14: - Left ventricle: The cavity size was normal. Wall thickness was    normal. Systolic function was normal. The estimated ejection    fraction was in the range of 55% to 60%. Wall motion was normal;    there were no regional wall motion abnormalities. Doppler    parameters are consistent with abnormal left ventricular    relaxation (grade 1 diastolic dysfunction).  - Aortic valve: There was no stenosis.  - Mitral valve: Mildly calcified annulus. There was trivial    regurgitation.  - Left atrium: The atrium was moderately dilated.  - Right ventricle: The cavity size was normal. Systolic function    was normal.  - Right atrium: The atrium was mildly dilated.  - Pulmonary arteries: No complete TR doppler jet so unable to    estimate PA systolic pressure.  - Inferior vena cava: The vessel was normal in size. The    respirophasic diameter changes were  in the normal range (>= 50%),    consistent with normal central venous pressure.    CHA2DS2-VASc Score = 4  The patient's score is based upon: CHF History: 0 HTN History: 1 Diabetes History: 1 Stroke History: 0 Vascular Disease History: 0 Age Score: 1 Gender Score: 1       ASSESSMENT AND PLAN: Persistent Atrial Fibrillation/atrial flutter The patient's CHA2DS2-VASc score is 4, indicating a 4.8% annual risk of stroke.   S/P PVI 07/25/14 S/p dofetilide loading 7/16-7/19/24 Patient appears to be maintaining SR Continue dofetilide 250 mcg BID, QT stable Check bmet/mag today Continue [name] 150 mg BID  Secondary Hypercoagulable State (ICD10:  D68.69) The patient is at significant risk for stroke/thromboembolism based upon her CHA2DS2-VASc Score of 4.  Continue Dabigatran ([name]).   HTN Stable on current regimen  Follow up with Otilio Saber as scheduled.    Sherrie Mustache, [place]  10/25/2022 2:59 PM

## 2022-10-28 ENCOUNTER — Telehealth: Payer: Self-pay | Admitting: Internal Medicine

## 2022-10-28 NOTE — Telephone Encounter (Signed)
Pt states she was in the hospital this past weekend and she is till having afib. She denies any SOB or CP. She wants to know if she should start back on dilitiazem. Please advise.

## 2022-10-28 NOTE — Telephone Encounter (Signed)
Spoke with the patient who reports that yesterday evening she went back into Afib. She states that she was previously doing well and has not missed any doses of her Tikosyn. She states that her heart rate got up to 150. She rested and tooks meds per usual and heart rate was able to come down however she could still tell rhythm was irregular. She states that she was able to sleep fine last night but woke up this morning and walking to the bathroom her heart rate got up to 135. She states that she feels fatigue like she did previously. No chest pain, shortness of breath or dizziness. She states that when she was in the hospital for Tikosyn admission that they stopped her diltiazem. She states that Luster Landsberg mentioned possible getting back on diltiazem if heart rates got high again and she would like to know if she should restart it.

## 2022-10-28 NOTE — Telephone Encounter (Signed)
Patient wants call back on next step regarding diltiazem.

## 2022-10-29 ENCOUNTER — Other Ambulatory Visit (HOSPITAL_COMMUNITY): Payer: Self-pay

## 2022-10-29 ENCOUNTER — Other Ambulatory Visit: Payer: Self-pay

## 2022-10-29 ENCOUNTER — Telehealth (HOSPITAL_COMMUNITY): Payer: Self-pay

## 2022-10-29 MED ORDER — DILTIAZEM HCL 120 MG PO TABS
120.0000 mg | ORAL_TABLET | Freq: Every day | ORAL | 3 refills | Status: DC
  Filled 2022-10-29: qty 30, 30d supply, fill #0

## 2022-10-29 MED ORDER — DILTIAZEM HCL 30 MG PO TABS
30.0000 mg | ORAL_TABLET | Freq: Four times a day (QID) | ORAL | 1 refills | Status: AC | PRN
  Filled 2022-10-29: qty 120, 30d supply, fill #0

## 2022-10-29 NOTE — Telephone Encounter (Signed)
Returned call to patient and shared [name]'s response.  Per [name], PA-C: I would use PRN dilt.   Start with 30mg  Q6 for persistent HRs > 120  Please see AFib clinic again if she remains in AFib, especially if faster rates    Patient states she took 300mg  of diltiazem last night when her heart rate was in the 150's because she hadn't heard from [place]. She states it did not work, she is still getting in the 120's (irregular) when she moves. She reports HR of 70's-80's when lying down, with an irregular rate.  She states she will contact A-Fib clinic for further management.  Diltiazem 30mg  Q6H PRN for persistent HR >120 sent to [facility] as requested.  Patient became tearful and shared how exhausting it is to be in a-fib and she was hoping the Tikosyn would have lasted longer to keep her out of it.  Patient expressed dissatisfaction with not receiving a call for 9 hours yesterday, nor anyone returning her call when she called in at 4:35pm to update her on whether or not a response was received from the providers. 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 She states she has been out of work for this and does not have any PAL, and this makes things difficult for her job and for her. Will forward to nursing supervisor.

## 2022-10-29 NOTE — Telephone Encounter (Signed)
Patient is having A-fib. HR yesterday was 153. She is having shortness of breath. Patient ended up taking her Diltiazem 300 mg tablet last night for her heart rate. HR around 70-100 today. She feels like she is going in and out.  She wanted to know what she needed to do to help with her heart rates. Per Tawny Hopping he wants her to start taking Diltiazem 120 mg once daily and she should continue taking her Diltiazem 30 mg as needed for HR greater than 100 and top number blood pressure needs to be above 100. Sent in prescription Diltiazem 120 mg tablet to her pharmacy. Consulted with patient and she verbalized understanding.

## 2022-10-29 NOTE — Telephone Encounter (Signed)
Pt calling for an update.

## 2022-10-30 ENCOUNTER — Encounter (INDEPENDENT_AMBULATORY_CARE_PROVIDER_SITE_OTHER): Payer: Self-pay

## 2022-11-01 ENCOUNTER — Ambulatory Visit
Admission: RE | Admit: 2022-11-01 | Discharge: 2022-11-01 | Disposition: A | Discharge status: Home / Self Care | Payer: Commercial Managed Care - PPO | Source: Ambulatory Visit | Attending: Family Medicine | Admitting: Family Medicine

## 2022-11-01 DIAGNOSIS — Z1231 Encounter for screening mammogram for malignant neoplasm of breast: Secondary | ICD-10-CM

## 2022-11-05 ENCOUNTER — Encounter (HOSPITAL_COMMUNITY): Payer: Self-pay

## 2022-11-05 ENCOUNTER — Ambulatory Visit (HOSPITAL_COMMUNITY): Payer: Commercial Managed Care - PPO | Admitting: Internal Medicine

## 2022-11-05 NOTE — Telephone Encounter (Signed)
Patient has been back in NSR since Saturday. Feeling much improved back in NSR. Will keep scheduled follow up for 8/20 call if issues prior to this.

## 2022-11-08 ENCOUNTER — Other Ambulatory Visit: Payer: Self-pay

## 2022-11-08 ENCOUNTER — Telehealth: Payer: Commercial Managed Care - PPO | Admitting: Family Medicine

## 2022-11-08 ENCOUNTER — Encounter: Payer: Self-pay | Admitting: Family Medicine

## 2022-11-08 ENCOUNTER — Telehealth (HOSPITAL_COMMUNITY): Payer: Self-pay

## 2022-11-08 VITALS — Ht 66.5 in

## 2022-11-08 DIAGNOSIS — U071 COVID-19: Secondary | ICD-10-CM | POA: Diagnosis not present

## 2022-11-08 MED ORDER — MOLNUPIRAVIR EUA 200MG CAPSULE
4.0000 | ORAL_CAPSULE | Freq: Two times a day (BID) | ORAL | 0 refills | Status: DC
  Filled 2022-11-08: qty 40, 5d supply, fill #0

## 2022-11-08 NOTE — Telephone Encounter (Signed)
Patient called to see if it is okay to take Robitussin, Molnupiravir and Imodium with her Tikosyn. She has been diagnosed with Covid. Symptoms: dry cough and some diarrhea. Consulted with Landry Mellow -PA and he checked with the pharmacist- Megan Supple. Patient was told it is okay to take Molnupiravir, Robitussin and she can only take Imodium sparingly. She is scheduled for a video visit today with her pcp. Communicated with patient and she verbalized understanding.

## 2022-11-08 NOTE — Assessment & Plan Note (Signed)
COVID19  Infection < 5 days from onset of symptoms in  vaccinated overweight individual with history of atrial fibrillation and obesity  No clear sign of bacterial infection at this time.   No SOB.  No red flags/need for ER visit or in-person exam at respiratory clinic at this time..    Pt higher risk for COVID complications given heart disease, age and obesity. Paxlovid is contraindicated given she is on Tikosyn.  Start molnupiravir 5 day course. Reviewed course of medication and side effect profile with patient in detail.   Symptomatic care with mucinex and cough suppressant at night. If SOB begins symptoms worsening.. have low threshold for in-person exam, if severe shortness of breath ER visit recommended.  Can monitor Oxygen saturation at home with home monitor if able to obtain.  Go to ER if O2 sat < 90% on room air.   Reviewed home care and provided information through MyChart.  Recommended quarantine 5 days isolation recommended. Return to work day 6 and wear mask for 4 more days to complete 10 days. Provided info about prevention of spread of COVID 19.

## 2022-11-08 NOTE — Progress Notes (Signed)
VIRTUAL VISIT A virtual visit is felt to be most appropriate for this patient at this time.   I connected with the patient on 11/08/22 at 12:00 PM EDT by virtual telehealth platform and verified that I am speaking with the correct person using two identifiers.   I discussed the limitations, risks, security and privacy concerns of performing an evaluation and management service by  virtual telehealth platform and the availability of in person appointments. I also discussed with the patient that there may be a patient responsible charge related to this service. The patient expressed understanding and agreed to proceed.  Patient location: Home Provider Location: [facility] Participants: Kerby Nora and Patience Musca   Chief Complaint  Patient presents with   Cough    Non-Productive-Started Monday Night   Headache   Fatigue   Covid Positive    Tested with [facility] [employer] on Tuesday   Fever   Nasal Congestion    History of Present Illness:  72 y.o. female patient of Jacklyn Branan E, MD presents with  COVID 19 infection   Date of onset:  8/5 Initial symptoms included  head congestion, cough Symptoms progressed to  raspy voice, dry cough,  clear nasal discharge. Body aches, chills  Diarrhea x 2, none today  Fever 102.73F on 8/6, today 1073F  Feeling weak. No SOB, no wheeze, no chest paIN. Dizziness better today. Cough keeping her up some at night.  HR 60s, pulse ox 95%  Blood sugar this AM 177.. has not been taking glipizide given not eating...but started back today.   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 COVID testing:   positive test on 8/6     She has tried to treat with tylenol  Trying to drink fluids ( has had 32 oz water today and cup of pepsi) , but minimal PO intake given no appetite     History of  Afib, HTN, obesity, DM.Marland Kitchen on Tikosyn Non-smoker.  Lab Results  Component Value Date   HGBA1C 8.8 (H) 10/16/2022      COVID 19 screen No  recent travel or known exposure to COVID19 The patient denies respiratory symptoms of COVID 19 at this time.  The importance of social distancing was discussed today.   Review of Systems  Constitutional:  Positive for malaise/fatigue.  HENT:  Positive for congestion.   Respiratory:  Positive for cough. Negative for shortness of breath and wheezing.       Past Medical History:  Diagnosis Date   Diabetes mellitus    pt is unaware and is on no medicine for this (A1C 5.9 2012)   Dyslipidemia    Hypertension    Overweight    Paroxysmal atrial fibrillation (HCC)    chads2vasc score of at least 2   Snoring    cancelled prior sleep study due to costs   Typical atrial flutter (HCC)     reports that she has never smoked. She has never used smokeless tobacco. She reports that she does not drink alcohol and does not use drugs.   Current Outpatient Medications:    acetaminophen (TYLENOL) 650 MG CR tablet, Take 650 mg by mouth at bedtime as needed for pain., Disp: , Rfl:    ALPRAZolam (XANAX) 0.25 MG tablet, Take 1 tablet (0.25 mg total) by mouth 2 (two) times daily as needed for anxiety., Disp: 30 tablet, Rfl: 5   atorvastatin (LIPITOR) 10 MG tablet, Take 1 tablet (10 mg total) by  mouth daily., Disp: 30 tablet, Rfl: 11   cyclobenzaprine (FLEXERIL) 10 MG tablet, Take 0.5-1 tablets (5-10 mg total) by mouth at bedtime as needed for muscle spasms., Disp: 30 tablet, Rfl: 0   dabigatran ([name]) 150 MG CAPS capsule, Take 1 capsule (150 mg total) by mouth 2 (two) times daily., Disp: 60 capsule, Rfl: 5   diltiazem (CARDIZEM) 120 MG tablet, Take 1 tablet (120 mg total) by mouth daily., Disp: 30 tablet, Rfl: 3   diltiazem (CARDIZEM) 30 MG tablet, Take 1 tablet (30 mg total) by mouth every 6 (six) hours as needed (for persistant heart rate greater than 120)., Disp: 120 tablet, Rfl: 1   dofetilide (TIKOSYN) 250 MCG capsule, Take 1 capsule (250 mcg total) by mouth 2 (two) times daily., Disp: 180 capsule, Rfl:  2   glipiZIDE (GLUCOTROL XL) 10 MG 24 hr tablet, Take 1 tablet (10 mg total) by mouth daily., Disp: 30 tablet, Rfl: 11   molnupiravir EUA (LAGEVRIO) 200 mg CAPS capsule, Take 4 capsules (800 mg total) by mouth 2 (two) times daily for 5 days., Disp: 40 capsule, Rfl: 0   traMADol (ULTRAM) 50 MG tablet, Take 1 tablet (50 mg total) by mouth every 12 (twelve) hours as needed., Disp: 30 tablet, Rfl: 0   Observations/Objective: Height 5' 6.5" (1.689 m).  Physical Exam Constitutional:      General: The patient is not in acute distress.  Ill-appearing but nontoxic Pulmonary:     Effort: Pulmonary effort is normal. No respiratory distress.  Neurological:     Mental Status: The patient is alert and oriented to person, place, and time.  Psychiatric:        Mood and Affect: Mood normal.        Behavior: Behavior normal.    Assessment and Plan COVID-19 Assessment & Plan: COVID19  Infection < 5 days from onset of symptoms in  vaccinated overweight individual with history of atrial fibrillation and obesity  No clear sign of bacterial infection at this time.   No SOB.  No red flags/need for ER visit or in-person exam at respiratory clinic at this time..    Pt higher risk for COVID complications given heart disease, age and obesity. Paxlovid is contraindicated given she is on Tikosyn.  Start molnupiravir 5 day course. Reviewed course of medication and side effect profile with patient in detail.   Symptomatic care with mucinex and cough suppressant at night. If SOB begins symptoms worsening.. have low threshold for in-person exam, if severe shortness of breath ER visit recommended.  Can monitor Oxygen saturation at home with home monitor if able to obtain.  Go to ER if O2 sat < 90% on room air.   Reviewed home care and provided information through MyChart.  Recommended quarantine 5 days isolation recommended. Return to work day 6 and wear mask for 4 more days to complete 10 days. Provided info  about prevention of spread of COVID 19.    Other orders -     molnupiravir EUA; Take 4 capsules (800 mg total) by mouth 2 (two) times daily for 5 days.  Dispense: 40 capsule; Refill: 0      I discussed the assessment and treatment plan with the patient. The patient was provided an opportunity to ask questions and all were answered. The patient agreed with the plan and demonstrated an understanding of the instructions.   The patient was advised to call back or seek an in-person evaluation if the symptoms worsen or if the condition  fails to improve as anticipated.     Kerby Nora, MD

## 2022-11-08 NOTE — Patient Instructions (Signed)
Start antiviral medication.  Push fluids , try to advance diet.  Continue glipizide if eating.

## 2022-11-11 ENCOUNTER — Other Ambulatory Visit: Payer: Self-pay

## 2022-11-11 ENCOUNTER — Telehealth: Payer: Self-pay | Admitting: Nurse Practitioner

## 2022-11-11 ENCOUNTER — Ambulatory Visit (INDEPENDENT_AMBULATORY_CARE_PROVIDER_SITE_OTHER): Payer: Commercial Managed Care - PPO | Admitting: Nurse Practitioner

## 2022-11-11 ENCOUNTER — Encounter: Payer: Self-pay | Admitting: Nurse Practitioner

## 2022-11-11 ENCOUNTER — Ambulatory Visit
Admission: RE | Admit: 2022-11-11 | Discharge: 2022-11-11 | Disposition: A | Discharge status: Home / Self Care | Payer: Commercial Managed Care - PPO | Source: Ambulatory Visit | Attending: Nurse Practitioner | Admitting: Nurse Practitioner

## 2022-11-11 VITALS — BP 132/80 | HR 69 | Temp 97.6°F | Ht 66.5 in | Wt 226.6 lb

## 2022-11-11 DIAGNOSIS — R0602 Shortness of breath: Secondary | ICD-10-CM | POA: Diagnosis not present

## 2022-11-11 DIAGNOSIS — R918 Other nonspecific abnormal finding of lung field: Secondary | ICD-10-CM | POA: Diagnosis not present

## 2022-11-11 DIAGNOSIS — R051 Acute cough: Secondary | ICD-10-CM

## 2022-11-11 DIAGNOSIS — R5383 Other fatigue: Secondary | ICD-10-CM | POA: Insufficient documentation

## 2022-11-11 DIAGNOSIS — U071 COVID-19: Secondary | ICD-10-CM

## 2022-11-11 DIAGNOSIS — R059 Cough, unspecified: Secondary | ICD-10-CM | POA: Diagnosis not present

## 2022-11-11 MED ORDER — GUAIFENESIN-CODEINE 100-10 MG/5ML PO SOLN
5.0000 mL | Freq: Three times a day (TID) | ORAL | 0 refills | Status: DC | PRN
Start: 2022-11-11 — End: 2023-04-09
  Filled 2022-11-11: qty 120, 8d supply, fill #0

## 2022-11-11 NOTE — Telephone Encounter (Signed)
Called and discussed xray with patinet we will hold off on antibiotics and try prescribed cough medication

## 2022-11-11 NOTE — Progress Notes (Signed)
Acute Office Visit  Subjective:     Patient ID: [name] Strickland, female    DOB: March 22, 1951, 72 y.o.   MRN: [id number]  Chief Complaint  Patient presents with   Covid Positive    Pt stated tested pos last Tuesday. Pt states of shortness of breath, dry coughing, painful headache/congestion, fever.     Chest Pain    Pt states of chest tightness this morning and not being able to catch her breath. States that it is not pain just tight. Complains of wheezing.    Medication Problem     Patient is in today for sick visit with a history of Afib, HTN, DM2, HLD,  Patient was seen virtaully on 11/08/2022 and started on molnupiravir. States that she took it Friday night states that within the hour she was dizzy for approx an hour. States that she took another dose on Saturday am and g ot very dizzy again. State that after that her heart became irregular and she did not take any more dose of it. States that she coverted back on Sunday. States that she does have prn diltazem but could not take it because her blood pressure was borderline.   States that she has not had an appeitie but is trying to eat to get her strength back  Has been taking tyleonol for the fever and body aches. Has tired robitussien without    States that she has been taking the glipizide over the weekend and has not been checking her sugars  Review of Systems  Constitutional:  Positive for malaise/fatigue. Fever: saturday. Respiratory:  Positive for cough and shortness of breath.   Cardiovascular:  Positive for chest pain (tightness).  Gastrointestinal:  Positive for diarrhea. Negative for abdominal pain, nausea and vomiting.  Musculoskeletal:  Negative for myalgias.  Neurological:  Positive for headaches.        Objective:    BP 132/80   Pulse 69   Temp 97.6 F (36.4 C) (Temporal)   Ht 5' 6.5" (1.689 m)   Wt 226 lb 9.6 oz (102.8 kg)   SpO2 98%   BMI 36.03 kg/m  BP Readings from Last 3 Encounters:  11/11/22 132/80   10/25/22 122/76  10/18/22 (!) 118/55   Wt Readings from Last 3 Encounters:  11/11/22 226 lb 9.6 oz (102.8 kg)  10/25/22 232 lb 9.6 oz (105.5 kg)  10/15/22 237 lb 1.6 oz (107.5 kg)      Physical Exam Vitals and nursing note reviewed.  Constitutional:      Appearance: Normal appearance.  HENT:     Right Ear: Ear canal and external ear normal. There is impacted cerumen.     Left Ear: Ear canal and external ear normal. There is impacted cerumen.     Mouth/Throat:     Mouth: Mucous membranes are moist.     Pharynx: No posterior oropharyngeal erythema.  Cardiovascular:     Rate and Rhythm: Normal rate and regular rhythm.     Heart sounds: Normal heart sounds.  Pulmonary:     Effort: Pulmonary effort is normal.     Breath sounds: Normal breath sounds.  Abdominal:     General: Bowel sounds are normal.  Lymphadenopathy:     Cervical: No cervical adenopathy.  Neurological:     Mental Status: She is alert.     No results found for any visits on 11/11/22.      Assessment & Plan:   Problem List Items Addressed This Visit  Other   COVID-19 - Primary    History of the same.  Was treated with molnupiravir but could not tolerate medication so stopped approximately 2 days then.  Patient symptoms are improving albeit slowly.  States that the color of her nasal discharge has changed from clear to yellow color.  We are pending chest x-ray today      Relevant Orders   DG Chest 2 View (Completed)   Other fatigue    Secondary to COVID-19 infection.  Did encourage patient to at least sit on the side of the bed and cannot make small walks with deep breathing exercises to prevent a secondary pneumonia developing.      Acute cough    Will check chest x-ray today pending results rule out secondary pneumonia.  Patient can continue Robitussin over-the-counter.  Can consider using guaifenesin-codeine cough medication.  If needed      Relevant Orders   DG Chest 2 View (Completed)     No orders of the defined types were placed in this encounter.   Return if symptoms worsen or fail to improve.  Audria Nine, NP

## 2022-11-11 NOTE — Assessment & Plan Note (Signed)
Secondary to COVID-19 infection.  Did encourage patient to at least sit on the side of the bed and cannot make small walks with deep breathing exercises to prevent a secondary pneumonia developing.

## 2022-11-11 NOTE — Assessment & Plan Note (Addendum)
Will check chest x-ray today pending results rule out secondary pneumonia.  Patient can continue Robitussin over-the-counter.  Can consider using guaifenesin-codeine cough medication.  If needed

## 2022-11-11 NOTE — Assessment & Plan Note (Signed)
History of the same.  Was treated with molnupiravir but could not tolerate medication so stopped approximately 2 days then.  Patient symptoms are improving albeit slowly.  States that the color of her nasal discharge has changed from clear to yellow color.  We are pending chest x-ray today

## 2022-11-11 NOTE — Patient Instructions (Signed)
Nice to see you today I will be in touch with the xray once I have the result Follow up if you do not continue to improve

## 2022-11-12 ENCOUNTER — Telehealth: Payer: Self-pay | Admitting: Family Medicine

## 2022-11-12 ENCOUNTER — Other Ambulatory Visit: Payer: Self-pay

## 2022-11-12 MED ORDER — DOXYCYCLINE HYCLATE 100 MG PO TABS
100.0000 mg | ORAL_TABLET | Freq: Two times a day (BID) | ORAL | 0 refills | Status: AC
  Filled 2022-11-12: qty 14, 7d supply, fill #0

## 2022-11-12 NOTE — Telephone Encounter (Signed)
Noted. Given co-morbidities.. I would have low threshold for in person exam... if not improving in 48 hours after antibiotics... make appt to be seen. Severe SOB.Marland Kitchen go to ER.

## 2022-11-12 NOTE — Telephone Encounter (Signed)
Noted. Doxycycline sent into the pharmacy

## 2022-11-12 NOTE — Telephone Encounter (Signed)
Called pt to inform of script sent in. Pt states she does not feel any better.

## 2022-11-12 NOTE — Telephone Encounter (Signed)
Patient contacted the office to give an update, says temp was 99.6 this morning. Yellow mucus when blowing nose, cough is dry and ongoing, states she will pick up cough meds today. Wants to know if she could possibly try an antibiotic? Please advise patient if needed. Sent to provider that patient saw yesterday and pcp pool for review

## 2022-11-13 ENCOUNTER — Other Ambulatory Visit: Payer: Self-pay

## 2022-11-13 MED ORDER — DILTIAZEM HCL ER COATED BEADS 120 MG PO CP24
120.0000 mg | ORAL_CAPSULE | Freq: Every day | ORAL | 6 refills | Status: DC
  Filled 2022-11-13: qty 30, 30d supply, fill #0
  Filled 2023-01-06: qty 30, 30d supply, fill #1
  Filled 2023-02-10: qty 30, 30d supply, fill #2
  Filled 2023-03-17: qty 30, 30d supply, fill #3
  Filled 2023-04-14 – 2023-04-30 (×2): qty 30, 30d supply, fill #4
  Filled 2023-05-26: qty 30, 30d supply, fill #5
  Filled 2023-06-24: qty 30, 30d supply, fill #6

## 2022-11-13 NOTE — Telephone Encounter (Signed)
Syniah did have an office with Audria Nine on 11/11/22.  Do I still need to reach out to her?

## 2022-11-13 NOTE — Telephone Encounter (Signed)
No need to call , I am sorry.. I though he just called it in.

## 2022-11-13 NOTE — Addendum Note (Signed)
Addended by: Hyman Bower F on: 11/13/2022 10:46 AM   Modules accepted: Orders

## 2022-11-14 ENCOUNTER — Other Ambulatory Visit (HOSPITAL_COMMUNITY): Payer: Self-pay

## 2022-11-14 ENCOUNTER — Other Ambulatory Visit: Payer: Self-pay

## 2022-11-19 ENCOUNTER — Ambulatory Visit: Payer: Commercial Managed Care - PPO | Admitting: Student

## 2022-11-20 ENCOUNTER — Telehealth: Payer: Self-pay | Admitting: Family Medicine

## 2022-11-20 NOTE — Telephone Encounter (Signed)
Received fax from [facility] for STD form on [name] Strickland.  Forms placed in Dr. Daphine Deutscher office in box to complete.

## 2022-11-22 NOTE — Telephone Encounter (Signed)
Completed forms faxed to [facility] at [phone].  Copy given to Jaeleah.  Original sent to be scanned.

## 2022-11-27 ENCOUNTER — Other Ambulatory Visit: Payer: Self-pay

## 2022-11-27 ENCOUNTER — Telehealth: Payer: Self-pay | Admitting: Family Medicine

## 2022-11-27 MED ORDER — FLUCONAZOLE 150 MG PO TABS
150.0000 mg | ORAL_TABLET | Freq: Once | ORAL | 0 refills | Status: DC
  Filled 2022-11-27: qty 2, 2d supply, fill #0

## 2022-11-27 NOTE — Telephone Encounter (Signed)
Sent in prescription for fluconazole to take 1 dose x 1, may repeat in 2 days if not fully resolved.

## 2022-11-27 NOTE — Addendum Note (Signed)
Addended by: Kerby Nora E on: 11/27/2022 09:57 AM   Modules accepted: Orders

## 2022-11-27 NOTE — Progress Notes (Deleted)
Cardiology Office Note:  .   Date:  11/27/2022  ID:  [name] Strickland, DOB May 07, 1950, MRN [id number] PCP: [name], MD  [facility] Providers Cardiologist:  None Electrophysiologist:  Lewayne Bunting, MD {  History of Present Illness: .   [name] Strickland is a 72 y.o. female w/PMHx of HTN, DM, AFib/AFlutter   She saw Dr. Ladona Ridgel 05/17/22, some AFib, though not much, tolerating meds OK, no changes were made.  Saw the Afib clinic in may after an ER visit with RVR, felt to be a flecainide failure at that juncture. Planned for Tikosyn  Admitted 10/15/22 - 10/18/22, converted with drug SB prompted d/c her diltiazem  Had her 1 week AFib clinic visit, feeling well, stable QTc  Called with recurrent AFib, palpitations, fast rates >> I advised PRN diltiazem rather then daily given her bradycardia in SR Eventually she did take a 300mg  dose (her prior dose) with improvement though remained in/out by symptoms Advised via AFib clinic 120mg  daily  11/08/22, called with COVID + inquiring about meds, addressed with pharmacy team  Comes today for Tikosyn visit  *** Tikosyn labs, meds, EKG, teaching *** pradaxa, bleeding, labs, dose *** burden?  ROS: ***   AFib/AAD hx Diagnosed 2012 07/26/2014 PVI and CTI ablation (Noted dual AV node physiology, though without clinical AVNRT, not ablated) Flecainide discontinued May 2024 with failure to maintain SR Tikosyn started July 2024   Studies Reviewed: .    07/26/2014: TTE Left ventricle: Systolic function was normal. The estimated    ejection fraction was in the range of 55% to 60%. Wall motion was    normal; there were no regional wall motion abnormalities.  - Aortic valve: No evidence of vegetation.  - Mitral valve: No evidence of vegetation. There was mild    regurgitation.  - Left atrium: The atrium was mildly dilated. No evidence of    thrombus in the atrial cavity or appendage.  - Right atrium: The atrium was mildly dilated. No  evidence of    thrombus in the atrial cavity or appendage.  - Atrial septum: No defect or patent foramen ovale was identified.    There was an atrial septal aneurysm.  - Tricuspid valve: No evidence of vegetation.  - Pulmonic valve: No evidence of vegetation.      07/26/2014: EPS/ablation CONCLUSIONS: 1. Sinus rhythm upon presentation.   2. Rotational Angiography reveals a moderate sized left atrium with four separate pulmonary veins without evidence of pulmonary vein stenosis. 3. Successful electrical isolation and anatomical encircling of all four pulmonary veins with radiofrequency current.    4. Cavo-tricuspid isthmus ablation was performed with complete bidirectional isthmus block achieved.  5. Atrial fibrillation induced with aggressive atrial pacing, successfully cardioverted to sinus rhythm 6. 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  Slow pathway ablation was therefore not performed today 7. No early apparent complications. Risk Assessment/Calculations:   {Does this patient have ATRIAL FIBRILLATION?:[phone]} No BP recorded.  {Refresh Note OR Click here to enter BP  :1}***       Physical Exam:   VS:  There were no vitals taken for this visit.   Wt Readings from Last 3 Encounters:  11/11/22 226 lb 9.6 oz (102.8 kg)  10/25/22 232 lb 9.6 oz (105.5 kg)  10/15/22 237 lb 1.6 oz (107.5 kg)    GEN: Well nourished, well developed in no acute distress NECK: No JVD; No carotid bruits CARDIAC: ***RRR, no murmurs, rubs, gallops RESPIRATORY:  *** CTA b/l,  wheezing or rhonchi  ABDOMEN: Soft, non-tender, non-distended EXTREMITIES:  *** No edema; No deformity   ASSESSMENT AND PLAN: .   Paroxysmal Afib CHA2DS2Vasc is 3, on [name], *** appropriately dosed Tikosyn w/*** QTc *** meds *** teaching  HTN ***   {Are you ordering a CV Procedure (e.g. stress test, cath, DCCV, TEE, etc)?   Press F2        :[id number]}    Dispo: ***  Signed, Sheilah Pigeon, PA-C

## 2022-11-27 NOTE — Telephone Encounter (Signed)
Patient just finished her antibiotic and have a yeast infection with vaginal and perineal itching with no discharge. Would like something to be sent in to  [facility]  Thank you!

## 2022-11-27 NOTE — Telephone Encounter (Signed)
Ayan notified that Dr. Ermalene Searing has sent her in a Rx for fluconazole.

## 2022-11-29 ENCOUNTER — Encounter: Payer: Self-pay | Admitting: Physician Assistant

## 2022-11-29 ENCOUNTER — Ambulatory Visit (INDEPENDENT_AMBULATORY_CARE_PROVIDER_SITE_OTHER): Payer: Commercial Managed Care - PPO | Admitting: Family Medicine

## 2022-11-29 ENCOUNTER — Other Ambulatory Visit: Payer: Self-pay | Admitting: Family Medicine

## 2022-11-29 ENCOUNTER — Ambulatory Visit: Payer: Commercial Managed Care - PPO | Attending: Student | Admitting: Physician Assistant

## 2022-11-29 ENCOUNTER — Other Ambulatory Visit: Payer: Self-pay

## 2022-11-29 ENCOUNTER — Ambulatory Visit: Payer: Commercial Managed Care - PPO | Admitting: Physician Assistant

## 2022-11-29 ENCOUNTER — Encounter: Payer: Self-pay | Admitting: Family Medicine

## 2022-11-29 VITALS — BP 124/62 | HR 58 | Ht 68.0 in | Wt 229.6 lb

## 2022-11-29 VITALS — BP 132/72 | HR 70 | Temp 97.6°F | Ht 66.5 in | Wt 230.0 lb

## 2022-11-29 DIAGNOSIS — Z1211 Encounter for screening for malignant neoplasm of colon: Secondary | ICD-10-CM | POA: Diagnosis not present

## 2022-11-29 DIAGNOSIS — E1165 Type 2 diabetes mellitus with hyperglycemia: Secondary | ICD-10-CM | POA: Diagnosis not present

## 2022-11-29 DIAGNOSIS — I480 Paroxysmal atrial fibrillation: Secondary | ICD-10-CM

## 2022-11-29 DIAGNOSIS — Z5181 Encounter for therapeutic drug level monitoring: Secondary | ICD-10-CM

## 2022-11-29 DIAGNOSIS — D6869 Other thrombophilia: Secondary | ICD-10-CM | POA: Diagnosis not present

## 2022-11-29 DIAGNOSIS — I10 Essential (primary) hypertension: Secondary | ICD-10-CM | POA: Diagnosis not present

## 2022-11-29 DIAGNOSIS — E1169 Type 2 diabetes mellitus with other specified complication: Secondary | ICD-10-CM | POA: Diagnosis not present

## 2022-11-29 DIAGNOSIS — Z6838 Body mass index (BMI) 38.0-38.9, adult: Secondary | ICD-10-CM | POA: Diagnosis not present

## 2022-11-29 DIAGNOSIS — E785 Hyperlipidemia, unspecified: Secondary | ICD-10-CM

## 2022-11-29 DIAGNOSIS — Z7984 Long term (current) use of oral hypoglycemic drugs: Secondary | ICD-10-CM

## 2022-11-29 DIAGNOSIS — Z23 Encounter for immunization: Secondary | ICD-10-CM | POA: Diagnosis not present

## 2022-11-29 DIAGNOSIS — Z0000 Encounter for general adult medical examination without abnormal findings: Secondary | ICD-10-CM | POA: Diagnosis not present

## 2022-11-29 DIAGNOSIS — Z79899 Other long term (current) drug therapy: Secondary | ICD-10-CM | POA: Diagnosis not present

## 2022-11-29 LAB — HM DIABETES FOOT EXAM

## 2022-11-29 LAB — MICROALBUMIN / CREATININE URINE RATIO
Creatinine,U: 108.2 mg/dL
Microalb Creat Ratio: 0.6 mg/g (ref 0.0–30.0)
Microalb, Ur: <0.7 mg/dL (ref 0.0–1.9)

## 2022-11-29 MED ORDER — TIRZEPATIDE 2.5 MG/0.5ML ~~LOC~~ SOAJ
2.5000 mg | SUBCUTANEOUS | 11 refills | Status: DC
  Filled 2022-11-29 (×2): qty 2, 28d supply, fill #0

## 2022-11-29 MED ORDER — ATORVASTATIN CALCIUM 10 MG PO TABS
10.0000 mg | ORAL_TABLET | Freq: Every day | ORAL | 3 refills | Status: DC
  Filled 2022-11-29: qty 90, 90d supply, fill #0
  Filled 2023-01-24 – 2023-03-17 (×2): qty 90, 90d supply, fill #1
  Filled 2023-06-24: qty 90, 90d supply, fill #2
  Filled 2023-09-29: qty 90, 90d supply, fill #3

## 2022-11-29 MED ORDER — TIRZEPATIDE 2.5 MG/0.5ML ~~LOC~~ SOAJ
2.5000 mg | SUBCUTANEOUS | 11 refills | Status: DC
Start: 2022-11-29 — End: 2024-01-26
  Filled 2022-11-29 (×2): qty 2, 28d supply, fill #0
  Filled 2022-12-25: qty 2, 28d supply, fill #1
  Filled 2023-01-24: qty 2, 28d supply, fill #2
  Filled 2023-02-17: qty 2, 28d supply, fill #3
  Filled 2023-03-17: qty 2, 28d supply, fill #4
  Filled 2023-04-14 – 2023-04-30 (×2): qty 2, 28d supply, fill #5
  Filled 2023-05-23: qty 2, 28d supply, fill #6
  Filled 2023-06-05 (×2): qty 6, 84d supply, fill #6
  Filled 2023-09-01: qty 6, 84d supply, fill #7

## 2022-11-29 NOTE — Assessment & Plan Note (Signed)
Chronic,  on tiokosyn.  [name] for anticoagulation.  Followed by cardiology.

## 2022-11-29 NOTE — Addendum Note (Signed)
Addended by: Kerby Nora E on: 11/29/2022 01:17 PM   Modules accepted: Orders

## 2022-11-29 NOTE — Patient Instructions (Addendum)
Medication Instructions:   STOP TAKING : DIFLUCAN  *If you need a refill on your cardiac medications before your next appointment, please call your pharmacy*   Lab Work: CMET MAG LIPIDS AND CBC TODAY    If you have labs (blood work) drawn today and your tests are completely normal, you will receive your results only by: MyChart Message (if you have MyChart) OR A paper copy in the mail If you have any lab test that is abnormal or we need to change your treatment, we will call you to review the results.   Testing/Procedures: NONE ORDERED  TODAY       Follow-Up: At [facility], you and your health needs are our priority.  As part of our continuing mission to provide you with exceptional heart care, we have created designated Provider Care Teams.  These Care Teams include your primary Cardiologist (physician) and Advanced Practice Providers (APPs -  Physician Assistants and Nurse Practitioners) who all work together to provide you with the care you need, when you need it.  We recommend signing up for the patient portal called "MyChart".  Sign up information is provided on this After Visit Summary.  MyChart is used to connect with patients for Virtual Visits (Telemedicine).  Patients are able to view lab/test results, encounter notes, upcoming appointments, etc.  Non-urgent messages can be sent to your provider as well.   To learn more about what you can do with MyChart, go to [email or web address].    Your next appointment:   4 month(s)  Provider:   [name], PA-C    Other Instructions

## 2022-11-29 NOTE — Patient Instructions (Addendum)
Please stop at the lab to have labs drawn. Set up yearly eye exam for diabetes and have the opthalmologist send [place] a copy of the evaluation for the chart.  Start mounjaro weekly.

## 2022-11-29 NOTE — Progress Notes (Signed)
Patient ID: [name] Strickland, female    DOB: 1950/10/07, 72 y.o.   MRN: [id number]  This visit was conducted in person.  BP 132/72 (BP Location: Right Arm, Patient Position: Sitting, Cuff Size: Large)   Pulse 70   Temp 97.6 F (36.4 C) (Temporal)   Ht 5' 6.5" (1.689 m)   Wt 230 lb (104.3 kg)   SpO2 98%   BMI 36.57 kg/m    CC:  Chief Complaint  Patient presents with   Annual Exam             Subjective:   HPI: [name] Strickland is a 72 y.o. female presenting on 11/29/2022 for Annual Exam (     )   Paroxsysmal Atrial fibrillation:  Reviewed OV from  cardiology earlier today. Followed by cardiology on tikosyn and Cardizem CD 120 mg p.o. daily for rate control.   On [name] 150 mg p.o. twice daily for anticoagulation.  Recent COVID infection in August now resolved... except mild cough and fatigue.  Hypertension:   Good control on  diltiazem . BP Readings from Last 3 Encounters:  11/29/22 132/72  11/29/22 124/62  11/11/22 132/80  Using medication without problems or lightheadedness:  none Chest pain with exertion: none Edema: none Short of breath: none Average home BPs: not checking recently Other issues:  Diabetes: Inadequate control on Glucotrol XL 10 mg p.o. daily  SE to metformin in past. Lab Results  Component Value Date   HGBA1C 8.8 (H) 10/16/2022  Using medications without difficulties: Hypoglycemic episodes: none Hyperglycemic episodes: Feet problems: FBS 170-180 Blood Sugars averaging: eye exam within last year: planning to schedule  Elevated Cholesterol:  On atorvastatin 10 mg daily  Due for re-eval. Using medications without problems: Muscle aches:  Diet compliance:  working on lower carb diet.Marland Kitchen occ skipping meals and decreasing portion size. Exercise: minimal Other complaints:      Wt Readings from Last 3 Encounters:  11/29/22 230 lb (104.3 kg)  11/29/22 229 lb 9.6 oz (104.1 kg)  11/11/22 226 lb 9.6 oz (102.8 kg)     Relevant past medical,  surgical, family and social history reviewed and updated as indicated. Interim medical history since our last visit reviewed. Allergies and medications reviewed and updated. Outpatient Medications Prior to Visit  Medication Sig Dispense Refill   acetaminophen (TYLENOL) 650 MG CR tablet Take 650 mg by mouth at bedtime as needed for pain.     ALPRAZolam (XANAX) 0.25 MG tablet Take 1 tablet (0.25 mg total) by mouth 2 (two) times daily as needed for anxiety. 30 tablet 5   atorvastatin (LIPITOR) 10 MG tablet Take 1 tablet (10 mg total) by mouth daily. 30 tablet 11   cyclobenzaprine (FLEXERIL) 10 MG tablet Take 0.5-1 tablets (5-10 mg total) by mouth at bedtime as needed for muscle spasms. 30 tablet 0   dabigatran ([name]) 150 MG CAPS capsule Take 1 capsule (150 mg total) by mouth 2 (two) times daily. 60 capsule 5   diltiazem (CARDIZEM CD) 120 MG 24 hr capsule Take 1 capsule (120 mg total) by mouth daily. 30 capsule 6   diltiazem (CARDIZEM) 30 MG tablet Take 1 tablet (30 mg total) by mouth every 6 (six) hours as needed (for persistant heart rate greater than 120). 120 tablet 1   dofetilide (TIKOSYN) 250 MCG capsule Take 1 capsule (250 mcg total) by mouth 2 (two) times daily. 180 capsule 2   glipiZIDE (GLUCOTROL XL) 10 MG 24 hr tablet Take 1 tablet (  10 mg total) by mouth daily. 30 tablet 11   guaiFENesin-codeine 100-10 MG/5ML syrup Take 5 mLs by mouth 3 (three) times daily as needed for cough. 120 mL 0   traMADol (ULTRAM) 50 MG tablet Take 1 tablet (50 mg total) by mouth every 12 (twelve) hours as needed. 30 tablet 0   No facility-administered medications prior to visit.      Per HPI unless specifically indicated in ROS section below Review of Systems  Constitutional:  Negative for fatigue and fever.  HENT:  Negative for congestion.   Eyes:  Negative for pain.  Respiratory:  Negative for cough and shortness of breath.   Cardiovascular:  Negative for chest pain, palpitations and leg swelling.   Gastrointestinal:  Negative for abdominal pain.  Genitourinary:  Negative for dysuria and vaginal bleeding.  Musculoskeletal:  Negative for back pain.  Neurological:  Negative for syncope, light-headedness and headaches.  Psychiatric/Behavioral:  Negative for dysphoric mood.    Objective:  BP 132/72 (BP Location: Right Arm, Patient Position: Sitting, Cuff Size: Large)   Pulse 70   Temp 97.6 F (36.4 C) (Temporal)   Ht 5' 6.5" (1.689 m)   Wt 230 lb (104.3 kg)   SpO2 98%   BMI 36.57 kg/m   Wt Readings from Last 3 Encounters:  11/29/22 230 lb (104.3 kg)  11/29/22 229 lb 9.6 oz (104.1 kg)  11/11/22 226 lb 9.6 oz (102.8 kg)      Physical Exam Vitals and nursing note reviewed.  Constitutional:      General: She is not in acute distress.    Appearance: Normal appearance. She is well-developed. She is obese. She is not ill-appearing or toxic-appearing.  HENT:     Head: Normocephalic.     Right Ear: Hearing, tympanic membrane, ear canal and external ear normal.     Left Ear: Hearing, tympanic membrane, ear canal and external ear normal.     Nose: Nose normal.  Eyes:     General: Lids are normal. Lids are everted, no foreign bodies appreciated.     Conjunctiva/sclera: Conjunctivae normal.     Pupils: Pupils are equal, round, and reactive to light.  Neck:     Thyroid: No thyroid mass or thyromegaly.     Vascular: No carotid bruit.     Trachea: Trachea normal.  Cardiovascular:     Rate and Rhythm: Normal rate and regular rhythm.     Heart sounds: Normal heart sounds, S1 normal and S2 normal. No murmur heard.    No gallop.  Pulmonary:     Effort: Pulmonary effort is normal. No respiratory distress.     Breath sounds: Normal breath sounds. No wheezing, rhonchi or rales.  Abdominal:     General: Bowel sounds are normal. There is no distension or abdominal bruit.     Palpations: Abdomen is soft. There is no fluid wave or mass.     Tenderness: There is no abdominal tenderness.  There is no guarding or rebound.     Hernia: No hernia is present.  Musculoskeletal:     Cervical back: Normal range of motion and neck supple.  Lymphadenopathy:     Cervical: No cervical adenopathy.  Skin:    General: Skin is warm and dry.     Findings: No rash.  Neurological:     Mental Status: She is alert.     Cranial Nerves: No cranial nerve deficit.     Sensory: No sensory deficit.  Psychiatric:  Mood and Affect: Mood is not anxious or depressed.        Speech: Speech normal.        Behavior: Behavior normal. Behavior is cooperative.        Judgment: Judgment normal.     Diabetic foot exam: Normal inspection No skin breakdown No calluses  Normal DP pulses Normal sensation to light touch and monofilament Nails normal     Results for orders placed or performed in visit on 11/29/22  HM DIABETES FOOT EXAM  Result Value Ref Range   HM Diabetic Foot Exam done      COVID 19 screen:  No recent travel or known exposure to COVID19 The patient denies respiratory symptoms of COVID 19 at this time. The importance of social distancing was discussed today.   Assessment and Plan   The patient's preventative maintenance and recommended screening tests for an annual wellness exam were reviewed in full today. Brought up to date unless services declined.  Counselled on the importance of diet, exercise, and its role in overall health and mortality. The patient's FH and SH was reviewed, including their home life, tobacco status, and drug and alcohol status.   Due for yearly eye exam Vaccines: Recommended Tdap, Shingrix series, pneumonia vaccine and high dose flu (only wants to do regualr dose, will do at work) Pap/DVE: Not indicated Mammo: 8/2/024 nml Bone Density: Overdue Colon: hemeoccult cards negative  Smoking Status:none ETOH/ drug GMW:NUUV/OZDG  Hep C: done  Problem List Items Addressed This Visit     Atrial fibrillation (HCC)    Chronic,  on tiokosyn.   [name] for anticoagulation.  Followed by cardiology.      Class 2 severe obesity due to excess calories with serious comorbidity and body mass index (BMI) of 38.0 to 38.9 in adult [facility])    Encouraged exercise, weight loss, healthy eating habits.       Relevant Medications   tirzepatide (MOUNJARO) 2.5 MG/0.5ML Pen   Essential hypertension    Stable, chronic.  Continue current medication.        Hyperlipidemia associated with type 2 diabetes mellitus (HCC)    Due for reevaluation On atorvastatin 10 mg daily      Relevant Medications   tirzepatide Avala) 2.5 MG/0.5ML Pen   Type 2 diabetes mellitus with hyperglycemia, without long-term current use of insulin (HCC)    Chronic, inadequate control at last check in July.   Recent fasting blood sugars at home despite work on weight loss and low carbohydrate diet continue to be between 1 70-1 80.  Recommend trial of Mounjaro 2.5 mg daily. Continue Glucotrol XL 10 mg daily with plan to wean off as able.  Due for reevaluation of A1c in  October.      Relevant Medications   tirzepatide (MOUNJARO) 2.5 MG/0.5ML Pen   Other Relevant Orders   Microalbumin / creatinine urine ratio   Other Visit Diagnoses     Routine general medical examination at a health care facility    -  Primary   Colon cancer screening       Relevant Orders   Fecal occult blood, imunochemical   Need for Tdap vaccination       Relevant Orders   Tdap vaccine greater than or equal to 7yo IM (Completed)         Kerby Nora, MD

## 2022-11-29 NOTE — Assessment & Plan Note (Signed)
Due for reevaluation On atorvastatin 10 mg daily

## 2022-11-29 NOTE — Assessment & Plan Note (Signed)
Encouraged exercise, weight loss, healthy eating habits. ? ?

## 2022-11-29 NOTE — Progress Notes (Signed)
Cardiology Office Note:  .   Date:  11/29/2022  ID:  [name] Strickland, [name] Strickland 15-Oct-1950, MRN [id number] PCP: [name], MD  [facility] Providers Cardiologist:  None Electrophysiologist:  Lewayne Bunting, MD {  History of Present Illness: .   [name] Strickland is a 72 y.o. female w/PMHx of HTN, DM, AFib/AFlutter   She saw Dr. Ladona Ridgel 05/17/22, some AFib, though not much, tolerating meds OK, no changes were made.  Saw the Afib clinic in may after an ER visit with RVR, felt to be a flecainide failure at that juncture. Planned for Tikosyn  Admitted 10/15/22 - 10/18/22, converted with drug SB prompted d/c her diltiazem  Had her 1 week AFib clinic visit, feeling well, stable QTc  Called with recurrent AFib, palpitations, fast rates >> I advised PRN diltiazem rather then daily given her bradycardia in SR Eventually she did take a 300mg  dose (her prior dose) with improvement though remained in/out by symptoms Advised via AFib clinic 120mg  daily  11/08/22, called with COVID + inquiring about meds, addressed with pharmacy team  Comes today for Tikosyn visit  ROS:  She is doing well Recovered from her COVID illness, a slight lingering cough only She did have some breakthrough palpitations/Afib, stopped the antivirals and it stopped as well. No CP No near syncope or syncope No bleeding or signs of bleeding Excellent medication compliance    AFib/AAD hx Diagnosed 2012 07/26/2014 PVI and CTI ablation (Noted dual AV node physiology, though without clinical AVNRT, not ablated) Flecainide discontinued May 2024 with failure to maintain SR Tikosyn started July 2024   Studies Reviewed: Marland Kitchen     EKG done today and reviewed by myself SB 58bpm, PAC, QTc  07/26/2014: TTE Left ventricle: Systolic function was normal. The estimated    ejection fraction was in the range of 55% to 60%. Wall motion was    normal; there were no regional wall motion abnormalities.  - Aortic valve: No evidence  of vegetation.  - Mitral valve: No evidence of vegetation. There was mild    regurgitation.  - Left atrium: The atrium was mildly dilated. No evidence of    thrombus in the atrial cavity or appendage.  - Right atrium: The atrium was mildly dilated. No evidence of    thrombus in the atrial cavity or appendage.  - Atrial septum: No defect or patent foramen ovale was identified.    There was an atrial septal aneurysm.  - Tricuspid valve: No evidence of vegetation.  - Pulmonic valve: No evidence of vegetation.      07/26/2014: EPS/ablation CONCLUSIONS: 1. Sinus rhythm upon presentation.   2. Rotational Angiography reveals a moderate sized left atrium with four separate pulmonary veins without evidence of pulmonary vein stenosis. 3. Successful electrical isolation and anatomical encircling of all four pulmonary veins with radiofrequency current.    4. Cavo-tricuspid isthmus ablation was performed with complete bidirectional isthmus block achieved.  5. Atrial fibrillation induced with aggressive atrial pacing, successfully cardioverted to sinus rhythm 6. 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  Slow pathway ablation was therefore not performed today 7. No early apparent complications. Risk Assessment/Calculations:            Physical Exam:   VS:  BP 124/62 (BP Location: Right Arm, Patient Position: Sitting, Cuff Size: Large)   Pulse (!) 58   Ht 5\' 8"  (1.727 m)   Wt 229 lb 9.6 oz (104.1 kg)   SpO2 98%   BMI 34.91  kg/m    Wt Readings from Last 3 Encounters:  11/29/22 229 lb 9.6 oz (104.1 kg)  11/11/22 226 lb 9.6 oz (102.8 kg)  10/25/22 232 lb 9.6 oz (105.5 kg)    GEN: Well nourished, well developed in no acute distress NECK: No JVD; No carotid bruits CARDIAC: RRR, no murmurs, rubs, gallops RESPIRATORY:   CTA b/l, wheezing or rhonchi  ABDOMEN: Soft, non-tender, non-distended EXTREMITIES:   No edema; No deformity   ASSESSMENT AND PLAN: .   Paroxysmal Afib CHA2DS2Vasc is  3, on [name], appropriately dosed Tikosyn w/stable QTc Reviewed meds, she never took diflucan, did look it up and found it can prolong QT so she did not take any, and never really got any vaginal symptoms (given with concerns of developing infection with antibiotics) Tikosyn teaching re-enforced, she does an excellent job Discussed topical agent would be OK like Monistat if needed going forward  Labs today, will get lipids as well with her pending annual labs due  HTN Looks good  3. Secondary hypercoagulable state     Dispo: back in 4 mo, sooner if needed  Signed, Sheilah Pigeon, PA-C

## 2022-11-29 NOTE — Assessment & Plan Note (Signed)
Stable, chronic.  Continue current medication.    

## 2022-11-29 NOTE — Assessment & Plan Note (Signed)
Chronic, inadequate control at last check in July.   Recent fasting blood sugars at home despite work on weight loss and low carbohydrate diet continue to be between 1 70-1 80.  Recommend trial of Mounjaro 2.5 mg daily. Continue Glucotrol XL 10 mg daily with plan to wean off as able.  Due for reevaluation of A1c in  October.

## 2022-11-30 LAB — COMPREHENSIVE METABOLIC PANEL
ALT: 9 IU/L (ref 0–32)
AST: 13 IU/L (ref 0–40)
Albumin: 4.1 g/dL (ref 3.9–4.9)
Alkaline Phosphatase: 62 IU/L (ref 44–121)
BUN/Creatinine Ratio: 10 — ABNORMAL LOW (ref 12–28)
BUN: 8 mg/dL (ref 8–27)
Bilirubin Total: 0.5 mg/dL (ref 0.0–1.2)
CO2: 27 mmol/L (ref 20–29)
Calcium: 9.4 mg/dL (ref 8.7–10.3)
Chloride: 103 mmol/L (ref 96–106)
Creatinine, Ser: 0.77 mg/dL (ref 0.57–1.00)
Globulin, Total: 2.9 g/dL (ref 1.5–4.5)
Glucose: 175 mg/dL — ABNORMAL HIGH (ref 70–99)
Potassium: 4.3 mmol/L (ref 3.5–5.2)
Sodium: 138 mmol/L (ref 134–144)
Total Protein: 7 g/dL (ref 6.0–8.5)
eGFR: 83 mL/min/{1.73_m2} (ref >59)

## 2022-11-30 LAB — LIPID PANEL
Chol/HDL Ratio: 4.6 ratio — ABNORMAL HIGH (ref 0.0–4.4)
Cholesterol, Total: 243 mg/dL — ABNORMAL HIGH (ref 100–199)
HDL: 53 mg/dL (ref >39)
LDL Chol Calc (NIH): 162 mg/dL — ABNORMAL HIGH (ref 0–99)
Triglycerides: 155 mg/dL — ABNORMAL HIGH (ref 0–149)
VLDL Cholesterol Cal: 28 mg/dL (ref 5–40)

## 2022-11-30 LAB — CBC
Hematocrit: 40.8 % (ref 34.0–46.6)
Hemoglobin: 13.3 g/dL (ref 11.1–15.9)
MCH: 27.5 pg (ref 26.6–33.0)
MCHC: 32.6 g/dL (ref 31.5–35.7)
MCV: 84 fL (ref 79–97)
Platelets: 360 10*3/uL (ref 150–450)
RBC: 4.84 x10E6/uL (ref 3.77–5.28)
RDW: 13.2 % (ref 11.7–15.4)
WBC: 6.7 10*3/uL (ref 3.4–10.8)

## 2022-11-30 LAB — MAGNESIUM: Magnesium: 2.1 mg/dL (ref 1.6–2.3)

## 2022-12-11 ENCOUNTER — Other Ambulatory Visit (INDEPENDENT_AMBULATORY_CARE_PROVIDER_SITE_OTHER): Payer: Self-pay

## 2022-12-11 DIAGNOSIS — Z1211 Encounter for screening for malignant neoplasm of colon: Secondary | ICD-10-CM

## 2022-12-12 LAB — FECAL OCCULT BLOOD, IMMUNOCHEMICAL: Fecal Occult Bld: NEGATIVE

## 2022-12-25 ENCOUNTER — Other Ambulatory Visit: Payer: Self-pay

## 2022-12-27 ENCOUNTER — Other Ambulatory Visit: Payer: Self-pay

## 2023-01-24 ENCOUNTER — Other Ambulatory Visit: Payer: Self-pay

## 2023-02-10 ENCOUNTER — Other Ambulatory Visit: Payer: Self-pay | Admitting: Family Medicine

## 2023-02-10 ENCOUNTER — Other Ambulatory Visit: Payer: Self-pay

## 2023-02-10 ENCOUNTER — Other Ambulatory Visit: Payer: Self-pay | Admitting: Internal Medicine

## 2023-02-10 MED ORDER — GLIPIZIDE ER 10 MG PO TB24
10.0000 mg | ORAL_TABLET | Freq: Every day | ORAL | 11 refills | Status: DC
  Filled 2023-02-10: qty 30, 30d supply, fill #0

## 2023-02-10 MED ORDER — DABIGATRAN ETEXILATE MESYLATE 150 MG PO CAPS
150.0000 mg | ORAL_CAPSULE | Freq: Two times a day (BID) | ORAL | 3 refills | Status: DC
  Filled 2023-02-10: qty 180, 90d supply, fill #0
  Filled 2023-05-23: qty 180, 90d supply, fill #1
  Filled 2023-09-03: qty 180, 90d supply, fill #2
  Filled 2023-11-27: qty 180, 90d supply, fill #3

## 2023-02-10 NOTE — Telephone Encounter (Signed)
Previous AVS states [name] Strickland is due for reevaluation of A1c in October.  Does she need an office visit with you or just a lab appointment for an A1c?

## 2023-02-11 NOTE — Telephone Encounter (Signed)
I intended for it to be a follow up with A1C but if she cannot do this  we can at least just do the A1C.

## 2023-02-11 NOTE — Telephone Encounter (Signed)
Noted.  [name] Strickland notified.

## 2023-02-17 ENCOUNTER — Other Ambulatory Visit: Payer: Self-pay

## 2023-02-21 ENCOUNTER — Other Ambulatory Visit: Payer: Self-pay

## 2023-02-21 ENCOUNTER — Ambulatory Visit: Payer: Commercial Managed Care - PPO | Admitting: Family Medicine

## 2023-02-21 VITALS — BP 130/80 | HR 78 | Temp 97.4°F | Ht 66.5 in | Wt 229.0 lb

## 2023-02-21 DIAGNOSIS — E1165 Type 2 diabetes mellitus with hyperglycemia: Secondary | ICD-10-CM | POA: Diagnosis not present

## 2023-02-21 DIAGNOSIS — Z6838 Body mass index (BMI) 38.0-38.9, adult: Secondary | ICD-10-CM

## 2023-02-21 DIAGNOSIS — E66812 Obesity, class 2: Secondary | ICD-10-CM | POA: Diagnosis not present

## 2023-02-21 DIAGNOSIS — I10 Essential (primary) hypertension: Secondary | ICD-10-CM | POA: Diagnosis not present

## 2023-02-21 DIAGNOSIS — Z7985 Long-term (current) use of injectable non-insulin antidiabetic drugs: Secondary | ICD-10-CM

## 2023-02-21 LAB — POCT GLYCOSYLATED HEMOGLOBIN (HGB A1C): Hemoglobin A1C: 6.3 % — AB (ref 4.0–5.6)

## 2023-02-21 MED ORDER — GLIPIZIDE ER 5 MG PO TB24
10.0000 mg | ORAL_TABLET | Freq: Every day | ORAL | 3 refills | Status: DC
Start: 2023-02-21 — End: 2023-10-14
  Filled 2023-02-21 – 2023-03-17 (×2): qty 90, 45d supply, fill #0
  Filled 2023-04-28: qty 90, 45d supply, fill #1
  Filled 2023-04-28: qty 60, 30d supply, fill #1
  Filled 2023-06-09: qty 60, 30d supply, fill #2
  Filled 2023-07-08: qty 60, 30d supply, fill #3
  Filled 2023-08-21 – 2023-09-01 (×2): qty 60, 30d supply, fill #4
  Filled 2023-09-29: qty 30, 15d supply, fill #5

## 2023-02-21 NOTE — Patient Instructions (Addendum)
Can use Miralax 17 grams daily for constipation.  Continue current mounjarno 2.5 mg weekly.  Decrease Glucotrol to 5 mg daily.  Set up yearly eye exam for diabetes and have the opthalmologist send [place] a copy of the evaluation for the chart.

## 2023-02-21 NOTE — Progress Notes (Signed)
Patient ID: [name] Strickland, female    DOB: Sep 04, 1950, 72 y.o.   MRN: [id number]  This visit was conducted in person.  BP 130/80 (BP Location: Left Arm, Patient Position: Sitting, Cuff Size: Large)   Pulse 78   Temp (!) 97.4 F (36.3 C) (Temporal)   Ht 5' 6.5" (1.689 m)   Wt 229 lb (103.9 kg)   SpO2 98%   BMI 36.41 kg/m    CC:  Chief Complaint  Patient presents with   Diabetes    Subjective:   HPI: [name] Strickland is a 72 y.o. female presenting on 02/21/2023 for Diabetes  Hypertension:   Good control on  diltiazem . BP Readings from Last 3 Encounters:  11/29/22 132/72  11/29/22 124/62  11/11/22 132/80  Using medication without problems or lightheadedness:  none Chest pain with exertion: none Edema: none Short of breath: none Average home BPs: not checking recently Other issues:  Diabetes: On Glucotrol XL 10 mg p.o. daily  SE to metformin in past.  On mounjarno 2.5 mg weekly.. has some nausea associated... on since 10/2022  Some constipation  as well. Lab Results  Component Value Date   HGBA1C 6.3 (A) 02/21/2023  Using medications without difficulties: Hypoglycemic episodes:   3 times in last 5 weeks ,  5-8 PM. Hyperglycemic episodes: Feet problems: FBS 100-120 Blood Sugars averaging: eye exam within last year: planning to schedule    Wt Readings from Last 3 Encounters:  02/21/23 229 lb (103.9 kg)  11/29/22 230 lb (104.3 kg)  11/29/22 229 lb 9.6 oz (104.1 kg)    Walking 15-20 min 3-4 days a week.  Relevant past medical, surgical, family and social history reviewed and updated as indicated. Interim medical history since our last visit reviewed. Allergies and medications reviewed and updated. Outpatient Medications Prior to Visit  Medication Sig Dispense Refill   acetaminophen (TYLENOL) 650 MG CR tablet Take 650 mg by mouth at bedtime as needed for pain.     ALPRAZolam (XANAX) 0.25 MG tablet Take 1 tablet (0.25 mg total) by mouth 2 (two) times daily as needed  for anxiety. 30 tablet 5   atorvastatin (LIPITOR) 10 MG tablet Take 1 tablet (10 mg total) by mouth daily. 90 tablet 3   cyclobenzaprine (FLEXERIL) 10 MG tablet Take 0.5-1 tablets (5-10 mg total) by mouth at bedtime as needed for muscle spasms. 30 tablet 0   dabigatran ([name]) 150 MG CAPS capsule Take 1 capsule (150 mg total) by mouth 2 (two) times daily. 180 capsule 3   diltiazem (CARDIZEM CD) 120 MG 24 hr capsule Take 1 capsule (120 mg total) by mouth daily. 30 capsule 6   diltiazem (CARDIZEM) 30 MG tablet Take 1 tablet (30 mg total) by mouth every 6 (six) hours as needed (for persistant heart rate greater than 120). 120 tablet 1   dofetilide (TIKOSYN) 250 MCG capsule Take 1 capsule (250 mcg total) by mouth 2 (two) times daily. 180 capsule 2   guaiFENesin-codeine 100-10 MG/5ML syrup Take 5 mLs by mouth 3 (three) times daily as needed for cough. 120 mL 0   tirzepatide (MOUNJARO) 2.5 MG/0.5ML Pen Inject 2.5 mg into the skin once a week. 2 mL 11   traMADol (ULTRAM) 50 MG tablet Take 1 tablet (50 mg total) by mouth every 12 (twelve) hours as needed. 30 tablet 0   glipiZIDE (GLUCOTROL XL) 10 MG 24 hr tablet Take 1 tablet (10 mg total) by mouth daily. 30 tablet 11  No facility-administered medications prior to visit.     Per HPI unless specifically indicated in ROS section below Review of Systems  Constitutional:  Negative for fatigue and fever.  HENT:  Negative for congestion.   Eyes:  Negative for pain.  Respiratory:  Negative for cough and shortness of breath.   Cardiovascular:  Negative for chest pain, palpitations and leg swelling.  Gastrointestinal:  Negative for abdominal pain.  Genitourinary:  Negative for dysuria and vaginal bleeding.  Musculoskeletal:  Negative for back pain.  Neurological:  Negative for syncope, light-headedness and headaches.  Psychiatric/Behavioral:  Negative for dysphoric mood.    Objective:  BP 130/80 (BP Location: Left Arm, Patient Position: Sitting, Cuff  Size: Large)   Pulse 78   Temp (!) 97.4 F (36.3 C) (Temporal)   Ht 5' 6.5" (1.689 m)   Wt 229 lb (103.9 kg)   SpO2 98%   BMI 36.41 kg/m   Wt Readings from Last 3 Encounters:  02/21/23 229 lb (103.9 kg)  11/29/22 230 lb (104.3 kg)  11/29/22 229 lb 9.6 oz (104.1 kg)      Physical Exam Constitutional:      General: She is not in acute distress.    Appearance: Normal appearance. She is well-developed. She is obese. She is not ill-appearing or toxic-appearing.  HENT:     Head: Normocephalic.     Right Ear: Hearing, tympanic membrane, ear canal and external ear normal. Tympanic membrane is not erythematous, retracted or bulging.     Left Ear: Hearing, tympanic membrane, ear canal and external ear normal. Tympanic membrane is not erythematous, retracted or bulging.     Nose: No mucosal edema or rhinorrhea.     Right Sinus: No maxillary sinus tenderness or frontal sinus tenderness.     Left Sinus: No maxillary sinus tenderness or frontal sinus tenderness.     Mouth/Throat:     Mouth: Oropharynx is clear and moist and mucous membranes are normal.     Pharynx: Uvula midline.  Eyes:     General: Lids are normal. Lids are everted, no foreign bodies appreciated.     Extraocular Movements: EOM normal.     Conjunctiva/sclera: Conjunctivae normal.     Pupils: Pupils are equal, round, and reactive to light.  Neck:     Thyroid: No thyroid mass or thyromegaly.     Vascular: No carotid bruit.     Trachea: Trachea normal.  Cardiovascular:     Rate and Rhythm: Normal rate and regular rhythm.     Pulses: Normal pulses.     Heart sounds: Normal heart sounds, S1 normal and S2 normal. No murmur heard.    No friction rub. No gallop.  Pulmonary:     Effort: Pulmonary effort is normal. No tachypnea or respiratory distress.     Breath sounds: Normal breath sounds. No decreased breath sounds, wheezing, rhonchi or rales.  Abdominal:     General: Bowel sounds are normal.     Palpations: Abdomen is  soft.     Tenderness: There is no abdominal tenderness.  Musculoskeletal:     Cervical back: Normal range of motion and neck supple.  Skin:    General: Skin is warm, dry and intact.     Findings: No rash.  Neurological:     Mental Status: She is alert.  Psychiatric:        Mood and Affect: Mood is not anxious or depressed.        Speech: Speech normal.  Behavior: Behavior normal. Behavior is cooperative.        Thought Content: Thought content normal.        Cognition and Memory: Cognition and memory normal.        Judgment: Judgment normal.       Results for orders placed or performed in visit on 02/21/23  POCT glycosylated hemoglobin (Hb A1C)  Result Value Ref Range   Hemoglobin A1C 6.3 (A) 4.0 - 5.6 %   HbA1c POC (<> result, manual entry)     HbA1c, POC (prediabetic range)     HbA1c, POC (controlled diabetic range)      Assessment and Plan  Type 2 diabetes mellitus with hyperglycemia, without long-term current use of insulin (HCC) Assessment & Plan: Chronic, improved control  Continue Glucotrol XL 10 mg daily  Tolerating Mounjaro 2.5 mg weekly  Orders: -     POCT glycosylated hemoglobin (Hb A1C)  Essential hypertension Assessment & Plan: Stable, chronic.  Continue current medication.     Class 2 severe obesity due to excess calories with serious comorbidity and body mass index (BMI) of 38.0 to 38.9 in adult [facility]) Assessment & Plan: Encouraged exercise, weight loss, healthy eating habits.    Other orders -     glipiZIDE ER; Take 2 tablets (10 mg total) by mouth daily.  Dispense: 90 tablet; Refill: 3    Return in about 3 months (around 05/24/2023) for diabetes follow up with fasting labs prior cholesterol and A1C.   Kerby Nora, MD

## 2023-03-10 NOTE — Assessment & Plan Note (Signed)
Encouraged exercise, weight loss, healthy eating habits. ? ?

## 2023-03-10 NOTE — Assessment & Plan Note (Signed)
Stable, chronic.  Continue current medication.    

## 2023-03-10 NOTE — Assessment & Plan Note (Signed)
Chronic, improved control  Continue Glucotrol XL 10 mg daily  Tolerating Mounjaro 2.5 mg weekly

## 2023-03-17 ENCOUNTER — Other Ambulatory Visit: Payer: Self-pay

## 2023-03-17 ENCOUNTER — Ambulatory Visit: Payer: Commercial Managed Care - PPO | Admitting: Physician Assistant

## 2023-04-09 ENCOUNTER — Ambulatory Visit: Payer: Commercial Managed Care - PPO | Attending: Physician Assistant | Admitting: Physician Assistant

## 2023-04-09 ENCOUNTER — Encounter: Payer: Self-pay | Admitting: Physician Assistant

## 2023-04-09 VITALS — BP 128/60 | HR 70 | Ht 68.0 in | Wt 234.2 lb

## 2023-04-09 DIAGNOSIS — I10 Essential (primary) hypertension: Secondary | ICD-10-CM

## 2023-04-09 DIAGNOSIS — D6869 Other thrombophilia: Secondary | ICD-10-CM

## 2023-04-09 DIAGNOSIS — Z5181 Encounter for therapeutic drug level monitoring: Secondary | ICD-10-CM | POA: Diagnosis not present

## 2023-04-09 DIAGNOSIS — I480 Paroxysmal atrial fibrillation: Secondary | ICD-10-CM

## 2023-04-09 DIAGNOSIS — Z79899 Other long term (current) drug therapy: Secondary | ICD-10-CM

## 2023-04-09 NOTE — Patient Instructions (Signed)
 Medication Instructions:   Your physician recommends that you continue on your current medications as directed. Please refer to the Current Medication list given to you today.   *If you need a refill on your cardiac medications before your next appointment, please call your pharmacy*   Lab Work:   PLEASE GO DOWN STAIRS FIRST FLOOR  [address] :   BMET AND MAG  TODAY    If you have labs (blood work) drawn today and your tests are completely normal, you will receive your results only by: MyChart Message (if you have MyChart) OR A paper copy in the mail If you have any lab test that is abnormal or we need to change your treatment, we will call you to review the results.   Testing/Procedures: NONE ORDERED  TODAY    Follow-Up: At [facility], you and your health needs are our priority.  As part of our continuing mission to provide you with exceptional heart care, we have created designated Provider Care Teams.  These Care Teams include your primary Cardiologist (physician) and Advanced Practice Providers (APPs -  Physician Assistants and Nurse Practitioners) who all work together to provide you with the care you need, when you need it.  We recommend signing up for the patient portal called MyChart.  Sign up information is provided on this After Visit Summary.  MyChart is used to connect with patients for Virtual Visits (Telemedicine).  Patients are able to view lab/test results, encounter notes, upcoming appointments, etc.  Non-urgent messages can be sent to your provider as well.   To learn more about what you can do with MyChart, go to [email or web address].    Your next appointment:   4 month(s) ( CONTACT  CASSIE HALL/ ANGELINE HAMMER FOR EP SCHEDULING ISSUES )   Provider:   You may see Danelle Birmingham, MD or one of the following Advanced Practice Providers on your designated Care Team:   Charlies Arthur, PA-C Michael Andy Tillery, PA-C  Daphne Barrack, NP   Other  Instructions

## 2023-04-09 NOTE — Progress Notes (Signed)
 Cardiology Office Note:  .   Date:  04/09/2023  ID:  [name] Strickland, DOB 1950-05-28, MRN [id number] PCP: Avelina Greig BRAVO, MD  [facility] Providers Cardiologist:  None Electrophysiologist:  Danelle Birmingham, MD {  History of Present Illness: .   [name] Strickland is a 73 y.o. female w/PMHx of HTN, DM, AFib/AFlutter   She saw Dr. Birmingham 05/17/22, some AFib, though not much, tolerating meds OK, no changes were made.  Saw the Afib clinic in may after an ER visit with RVR, felt to be a flecainide  failure at that juncture. Planned for Tikosyn   Admitted 10/15/22 - 10/18/22, converted with drug SB prompted d/c her diltiazem   Had her 1 week AFib clinic visit, feeling well, stable QTc  Called with recurrent AFib, palpitations, fast rates >> I advised PRN diltiazem  rather then daily given her bradycardia in SR Eventually she did take a 300mg  dose (her prior dose) with improvement though remained in/out by symptoms Advised via AFib clinic 120mg  daily  11/08/22, called with COVID + inquiring about meds, addressed with pharmacy team  I saw her 11/29/22 She is doing well Recovered from her COVID illness, a slight lingering cough only She did have some breakthrough palpitations/Afib, stopped the antivirals and it stopped as well. No CP No near syncope or syncope No bleeding or signs of bleeding Excellent medication compliance Stable QT, planned for labs  Comes today for Tikosyn  visit  ROS:   She continues to do and feel quite well Still working at [employer]. No CP, palpitations, SOB No near syncope or syncope She is very good with her meds No bleeding or signs of bleeding  AFib/AAD hx Diagnosed 2012 07/26/2014 PVI and CTI ablation (Noted dual AV node physiology, though without clinical AVNRT, not ablated) Flecainide  discontinued May 2024 with failure to maintain SR Tikosyn  started July 2024   Studies Reviewed: SABRA     EKG done today and reviewed by myself SR 70bpm, QTc   11/29/22: SB 58bpm, PAC, QTc  07/26/2014: TTE Left ventricle: Systolic function was normal. The estimated    ejection fraction was in the range of 55% to 60%. Wall motion was    normal; there were no regional wall motion abnormalities.  - Aortic valve: No evidence of vegetation.  - Mitral valve: No evidence of vegetation. There was mild    regurgitation.  - Left atrium: The atrium was mildly dilated. No evidence of    thrombus in the atrial cavity or appendage.  - Right atrium: The atrium was mildly dilated. No evidence of    thrombus in the atrial cavity or appendage.  - Atrial septum: No defect or patent foramen ovale was identified.    There was an atrial septal aneurysm.  - Tricuspid valve: No evidence of vegetation.  - Pulmonic valve: No evidence of vegetation.      07/26/2014: EPS/ablation CONCLUSIONS: 1. Sinus rhythm upon presentation.   2. Rotational Angiography reveals a moderate sized left atrium with four separate pulmonary veins without evidence of pulmonary vein stenosis. 3. Successful electrical isolation and anatomical encircling of all four pulmonary veins with radiofrequency current.    4. Cavo-tricuspid isthmus ablation was performed with complete bidirectional isthmus block achieved.  5. Atrial fibrillation induced with aggressive atrial pacing, successfully cardioverted to sinus rhythm 6. 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  Slow pathway ablation was therefore not performed today 7. No early apparent complications. Risk Assessment/Calculations:  Physical Exam:   VS:  BP 128/60   Pulse 70   Ht 5' 8 (1.727 m)   Wt 234 lb 3.2 oz (106.2 kg)   SpO2 98%   BMI 35.61 kg/m    Wt Readings from Last 3 Encounters:  04/09/23 234 lb 3.2 oz (106.2 kg)  02/21/23 229 lb (103.9 kg)  11/29/22 230 lb (104.3 kg)    GEN: Well nourished, well developed in no acute distress NECK: No JVD; No carotid bruits CARDIAC: RRR, no murmurs, rubs,  gallops RESPIRATORY:  CTA b/l, wheezing or rhonchi  ABDOMEN: Soft, non-tender, non-distended EXTREMITIES:  No edema; No deformity   ASSESSMENT AND PLAN: .     Paroxysmal Afib CHA2DS2Vasc is 3, on [name] , appropriately dosed Tikosyn  w/stable QTc Reviewed meds Tikosyn  teaching re-enforced, she does an excellent job labs  2     HTN Looks good  3. Secondary hypercoagulable state     Dispo:   back in 4 mo, sooner if needed  Signed, Charlies Macario Arthur, PA-C

## 2023-04-10 LAB — BASIC METABOLIC PANEL
BUN/Creatinine Ratio: 15 (ref 12–28)
BUN: 11 mg/dL (ref 8–27)
CO2: 18 mmol/L — ABNORMAL LOW (ref 20–29)
Calcium: 9.9 mg/dL (ref 8.7–10.3)
Chloride: 104 mmol/L (ref 96–106)
Creatinine, Ser: 0.74 mg/dL (ref 0.57–1.00)
Glucose: 100 mg/dL — ABNORMAL HIGH (ref 70–99)
Potassium: 4.9 mmol/L (ref 3.5–5.2)
Sodium: 141 mmol/L (ref 134–144)
eGFR: 87 mL/min/{1.73_m2} (ref >59)

## 2023-04-10 LAB — MAGNESIUM: Magnesium: 2.4 mg/dL — ABNORMAL HIGH (ref 1.6–2.3)

## 2023-04-14 ENCOUNTER — Other Ambulatory Visit: Payer: Self-pay

## 2023-04-28 ENCOUNTER — Other Ambulatory Visit: Payer: Self-pay

## 2023-04-29 ENCOUNTER — Other Ambulatory Visit: Payer: Self-pay

## 2023-04-30 ENCOUNTER — Other Ambulatory Visit: Payer: Self-pay

## 2023-05-05 ENCOUNTER — Other Ambulatory Visit: Payer: Self-pay

## 2023-05-23 ENCOUNTER — Other Ambulatory Visit: Payer: Self-pay

## 2023-05-24 DIAGNOSIS — H5202 Hypermetropia, left eye: Secondary | ICD-10-CM | POA: Diagnosis not present

## 2023-05-24 DIAGNOSIS — H5211 Myopia, right eye: Secondary | ICD-10-CM | POA: Diagnosis not present

## 2023-05-24 DIAGNOSIS — E119 Type 2 diabetes mellitus without complications: Secondary | ICD-10-CM | POA: Diagnosis not present

## 2023-05-24 DIAGNOSIS — Z7984 Long term (current) use of oral hypoglycemic drugs: Secondary | ICD-10-CM | POA: Diagnosis not present

## 2023-05-24 DIAGNOSIS — H524 Presbyopia: Secondary | ICD-10-CM | POA: Diagnosis not present

## 2023-05-24 DIAGNOSIS — H52223 Regular astigmatism, bilateral: Secondary | ICD-10-CM | POA: Diagnosis not present

## 2023-05-24 LAB — HM DIABETES EYE EXAM

## 2023-05-26 ENCOUNTER — Other Ambulatory Visit: Payer: Self-pay

## 2023-06-05 ENCOUNTER — Other Ambulatory Visit: Payer: Self-pay

## 2023-06-13 ENCOUNTER — Telehealth: Payer: Self-pay | Admitting: Internal Medicine

## 2023-06-13 DIAGNOSIS — Z0279 Encounter for issue of other medical certificate: Secondary | ICD-10-CM

## 2023-06-13 NOTE — Telephone Encounter (Signed)
 Patient came in and signed and paid.  [employer] form in Dr. Lubertha Basque box.

## 2023-06-13 NOTE — Telephone Encounter (Signed)
 Received [employer] F[employer] form.  I called patient who said that she would come in today or next week to pay the $29 form fee and sign the release of information. R.O.I.'s at the front desk.

## 2023-06-18 NOTE — Telephone Encounter (Signed)
 Noted.

## 2023-06-23 NOTE — Telephone Encounter (Signed)
 Forms filed out and turned in.

## 2023-06-23 NOTE — Telephone Encounter (Signed)
 Completed [employer] form faxed to insurance and scanned into chart.  Billing notified.

## 2023-07-08 ENCOUNTER — Other Ambulatory Visit: Payer: Self-pay

## 2023-07-08 ENCOUNTER — Other Ambulatory Visit: Payer: Self-pay | Admitting: Family Medicine

## 2023-07-08 MED ORDER — CYCLOBENZAPRINE HCL 10 MG PO TABS
5.0000 mg | ORAL_TABLET | Freq: Every evening | ORAL | 0 refills | Status: DC | PRN
  Filled 2023-07-08: qty 30, 30d supply, fill #0

## 2023-07-08 MED ORDER — TRAMADOL HCL 50 MG PO TABS
50.0000 mg | ORAL_TABLET | Freq: Two times a day (BID) | ORAL | 0 refills | Status: DC | PRN
  Filled 2023-07-08: qty 30, 15d supply, fill #0

## 2023-07-08 NOTE — Telephone Encounter (Signed)
 Cathy came by desk asking to send refill request to Dr. Ermalene Searing for cyclobenzaprine and Tramadol for pain in Left Buttocks that radiates down leg.   Last office visit cyclobenzaprine 11/17/2020 for #30 with no refills.  Tramadol 03/19/2022 for #30 with no refills.   Next Appt: No future appointments with PCP.

## 2023-07-20 ENCOUNTER — Emergency Department

## 2023-07-20 ENCOUNTER — Other Ambulatory Visit: Payer: Self-pay

## 2023-07-20 ENCOUNTER — Inpatient Hospital Stay
Admission: EM | Admit: 2023-07-20 | Discharge: 2023-07-22 | DRG: 308 | Disposition: A | Discharge status: Home / Self Care | Attending: Internal Medicine | Admitting: Internal Medicine

## 2023-07-20 DIAGNOSIS — Z7985 Long-term (current) use of injectable non-insulin antidiabetic drugs: Secondary | ICD-10-CM

## 2023-07-20 DIAGNOSIS — G4700 Insomnia, unspecified: Secondary | ICD-10-CM | POA: Diagnosis present

## 2023-07-20 DIAGNOSIS — I110 Hypertensive heart disease with heart failure: Secondary | ICD-10-CM | POA: Diagnosis present

## 2023-07-20 DIAGNOSIS — I5031 Acute diastolic (congestive) heart failure: Secondary | ICD-10-CM | POA: Diagnosis present

## 2023-07-20 DIAGNOSIS — E785 Hyperlipidemia, unspecified: Secondary | ICD-10-CM | POA: Diagnosis not present

## 2023-07-20 DIAGNOSIS — R079 Chest pain, unspecified: Secondary | ICD-10-CM | POA: Diagnosis not present

## 2023-07-20 DIAGNOSIS — Z8249 Family history of ischemic heart disease and other diseases of the circulatory system: Secondary | ICD-10-CM | POA: Diagnosis not present

## 2023-07-20 DIAGNOSIS — E669 Obesity, unspecified: Secondary | ICD-10-CM | POA: Diagnosis not present

## 2023-07-20 DIAGNOSIS — Z882 Allergy status to sulfonamides status: Secondary | ICD-10-CM | POA: Diagnosis not present

## 2023-07-20 DIAGNOSIS — Z6835 Body mass index (BMI) 35.0-35.9, adult: Secondary | ICD-10-CM

## 2023-07-20 DIAGNOSIS — E1165 Type 2 diabetes mellitus with hyperglycemia: Secondary | ICD-10-CM

## 2023-07-20 DIAGNOSIS — Z7902 Long term (current) use of antithrombotics/antiplatelets: Secondary | ICD-10-CM

## 2023-07-20 DIAGNOSIS — I4819 Other persistent atrial fibrillation: Secondary | ICD-10-CM | POA: Diagnosis not present

## 2023-07-20 DIAGNOSIS — Z823 Family history of stroke: Secondary | ICD-10-CM | POA: Diagnosis not present

## 2023-07-20 DIAGNOSIS — I4891 Unspecified atrial fibrillation: Principal | ICD-10-CM | POA: Diagnosis present

## 2023-07-20 DIAGNOSIS — D6869 Other thrombophilia: Secondary | ICD-10-CM | POA: Diagnosis present

## 2023-07-20 DIAGNOSIS — Z79899 Other long term (current) drug therapy: Secondary | ICD-10-CM

## 2023-07-20 DIAGNOSIS — Z91013 Allergy to seafood: Secondary | ICD-10-CM

## 2023-07-20 DIAGNOSIS — Z7984 Long term (current) use of oral hypoglycemic drugs: Secondary | ICD-10-CM

## 2023-07-20 DIAGNOSIS — E119 Type 2 diabetes mellitus without complications: Secondary | ICD-10-CM | POA: Diagnosis not present

## 2023-07-20 DIAGNOSIS — Z91041 Radiographic dye allergy status: Secondary | ICD-10-CM

## 2023-07-20 DIAGNOSIS — Z96653 Presence of artificial knee joint, bilateral: Secondary | ICD-10-CM | POA: Diagnosis present

## 2023-07-20 DIAGNOSIS — R0602 Shortness of breath: Secondary | ICD-10-CM

## 2023-07-20 DIAGNOSIS — I10 Essential (primary) hypertension: Secondary | ICD-10-CM | POA: Diagnosis not present

## 2023-07-20 LAB — CBC WITH DIFFERENTIAL/PLATELET
Abs Immature Granulocytes: 0.03 10*3/uL (ref 0.00–0.07)
Basophils Absolute: 0.1 10*3/uL (ref 0.0–0.1)
Basophils Relative: 1 %
Eosinophils Absolute: 0.4 10*3/uL (ref 0.0–0.5)
Eosinophils Relative: 4 %
HCT: 49.3 % — ABNORMAL HIGH (ref 36.0–46.0)
Hemoglobin: 16.4 g/dL — ABNORMAL HIGH (ref 12.0–15.0)
Immature Granulocytes: 0 %
Lymphocytes Relative: 39 %
Lymphs Abs: 3.6 10*3/uL (ref 0.7–4.0)
MCH: 27.6 pg (ref 26.0–34.0)
MCHC: 33.3 g/dL (ref 30.0–36.0)
MCV: 82.9 fL (ref 80.0–100.0)
Monocytes Absolute: 0.7 10*3/uL (ref 0.1–1.0)
Monocytes Relative: 7 %
Neutro Abs: 4.5 10*3/uL (ref 1.7–7.7)
Neutrophils Relative %: 49 %
Platelets: 371 10*3/uL (ref 150–400)
RBC: 5.95 MIL/uL — ABNORMAL HIGH (ref 3.87–5.11)
RDW: 13.6 % (ref 11.5–15.5)
WBC: 9.2 10*3/uL (ref 4.0–10.5)
nRBC: 0 % (ref 0.0–0.2)

## 2023-07-20 LAB — TROPONIN I (HIGH SENSITIVITY)
Troponin I (High Sensitivity): 7 ng/L (ref <18)
Troponin I (High Sensitivity): 6 ng/L (ref <18)

## 2023-07-20 LAB — COMPREHENSIVE METABOLIC PANEL WITH GFR
ALT: 14 U/L (ref 0–44)
AST: 20 U/L (ref 15–41)
Albumin: 4.2 g/dL (ref 3.5–5.0)
Alkaline Phosphatase: 54 U/L (ref 38–126)
Anion gap: 11 (ref 5–15)
BUN: 13 mg/dL (ref 8–23)
CO2: 23 mmol/L (ref 22–32)
Calcium: 9.6 mg/dL (ref 8.9–10.3)
Chloride: 104 mmol/L (ref 98–111)
Creatinine, Ser: 0.71 mg/dL (ref 0.44–1.00)
GFR, Estimated: >60 mL/min (ref >60)
Glucose, Bld: 140 mg/dL — ABNORMAL HIGH (ref 70–99)
Potassium: 3.7 mmol/L (ref 3.5–5.1)
Sodium: 138 mmol/L (ref 135–145)
Total Bilirubin: 0.6 mg/dL (ref 0.0–1.2)
Total Protein: 8.1 g/dL (ref 6.5–8.1)

## 2023-07-20 LAB — MAGNESIUM: Magnesium: 2.1 mg/dL (ref 1.7–2.4)

## 2023-07-20 MED ORDER — ATORVASTATIN CALCIUM 10 MG PO TABS
10.0000 mg | ORAL_TABLET | Freq: Every day | ORAL | Status: DC
  Administered 2023-07-21 – 2023-07-22 (×2): 10 mg via ORAL
  Filled 2023-07-20 (×2): qty 1

## 2023-07-20 MED ORDER — ONDANSETRON HCL 4 MG PO TABS: 4.0000 mg | ORAL_TABLET | Freq: Four times a day (QID) | ORAL | Status: DC | PRN

## 2023-07-20 MED ORDER — FUROSEMIDE 10 MG/ML IJ SOLN
20.0000 mg | Freq: Once | INTRAMUSCULAR | Status: AC
Start: 2023-07-20 — End: 2023-07-21
  Administered 2023-07-21: 20 mg via INTRAVENOUS
  Filled 2023-07-20: qty 2

## 2023-07-20 MED ORDER — ALPRAZOLAM 0.25 MG PO TABS: 0.2500 mg | ORAL_TABLET | Freq: Two times a day (BID) | ORAL | Status: DC | PRN

## 2023-07-20 MED ORDER — ACETAMINOPHEN 650 MG RE SUPP: 650.0000 mg | Freq: Four times a day (QID) | RECTAL | Status: DC | PRN

## 2023-07-20 MED ORDER — DABIGATRAN ETEXILATE MESYLATE 150 MG PO CAPS: 150.0000 mg | ORAL_CAPSULE | Freq: Two times a day (BID) | ORAL | Status: DC

## 2023-07-20 MED ORDER — ALBUTEROL SULFATE (2.5 MG/3ML) 0.083% IN NEBU: 2.5000 mg | INHALATION_SOLUTION | RESPIRATORY_TRACT | Status: DC | PRN

## 2023-07-20 MED ORDER — DILTIAZEM HCL 25 MG/5ML IV SOLN
10.0000 mg | Freq: Once | INTRAVENOUS | Status: AC
  Administered 2023-07-20: 10 mg via INTRAVENOUS
  Filled 2023-07-20: qty 5

## 2023-07-20 MED ORDER — ACETAMINOPHEN 325 MG PO TABS: 650.0000 mg | ORAL_TABLET | Freq: Four times a day (QID) | ORAL | Status: DC | PRN

## 2023-07-20 MED ORDER — ONDANSETRON HCL 4 MG/2ML IJ SOLN: 4.0000 mg | Freq: Four times a day (QID) | INTRAMUSCULAR | Status: DC | PRN

## 2023-07-20 MED ORDER — DILTIAZEM HCL-DEXTROSE 125-5 MG/125ML-% IV SOLN (PREMIX)
5.0000 mg/h | INTRAVENOUS | Status: DC
  Administered 2023-07-20 – 2023-07-21 (×2): 5 mg/h via INTRAVENOUS
  Filled 2023-07-20 (×2): qty 125

## 2023-07-20 MED ORDER — BISACODYL 5 MG PO TBEC: 5.0000 mg | DELAYED_RELEASE_TABLET | Freq: Every day | ORAL | Status: DC | PRN

## 2023-07-20 MED ORDER — POLYETHYLENE GLYCOL 3350 17 G PO PACK: 17.0000 g | PACK | Freq: Every day | ORAL | Status: DC

## 2023-07-20 MED ORDER — POTASSIUM CHLORIDE CRYS ER 20 MEQ PO TBCR
40.0000 meq | EXTENDED_RELEASE_TABLET | Freq: Once | ORAL | Status: AC
  Administered 2023-07-21: 40 meq via ORAL
  Filled 2023-07-20: qty 2

## 2023-07-20 MED ORDER — CYCLOBENZAPRINE HCL 10 MG PO TABS: 5.0000 mg | ORAL_TABLET | Freq: Every evening | ORAL | Status: DC | PRN

## 2023-07-20 NOTE — ED Notes (Signed)
 Blue top sent down.

## 2023-07-20 NOTE — ED Notes (Signed)
 CCMD called and pt was added to cardiac monitoring

## 2023-07-20 NOTE — H&P (Addendum)
 History and Physical  [name] Strickland BJY:[id number] DOB: 1950-10-08 DOA: 07/20/2023 PCP: Judithann Novas, MD  Chief Complaint: weak, SOB Historian: patient  HPI:  [name] Strickland is a 73 y.o. female with a PMH significant for PAF, HTN, obesity, type 2 diabetes without insulin  use, HLD. At baseline, they live at home with her daughter and self-sufficient with their ADLs.  They presented from home to the ED on 07/20/2023 with due to recognizing onset of acute A-fib RVR similar to past episodes.  Patient describes inability to sleep last night and was up until about 3 AM.  She thinks possibly she was worrying about today being a holiday and her birthday because in her family everyone who has passed away has done so on a holiday and she may have been anticipating a bad outcome today.  She otherwise felt in her normal state of health when she went to bed yesterday.  This morning, when she got up to go to the bathroom she felt lightheaded, shortness of breath.  She took her morning medications including antiarrhythmic and [name]  and laid back down which resulted in her heart rate slowing.  Again later in the day she got up to go to the bathroom with similar symptoms and took a as needed diltiazem  dose as instructed by her cardiologist when in acute A-fib episode however, her heart rate did not slow with rest the second time so she instructed her daughter to bring her to the emergency department.  Denies chest pain but has achy feeling in her left shoulder.  At the time of my interview, she has already been started on the diltiazem  drip with heart rate in the 90s still in atrial fibrillation.  Her symptoms have improved but still feels tired. She states that she is strongly adherent with all of her medications has not missed any doses recently.  Denies any recent illnesses.  Last time she was in rapid rhythm was July of last year and converted spontaneously on diltiazem  drip.  She has multiple cardioversions in her  past as well as 1 failed attempt at ablation in her remote past. She follows with Dr. Pete Brand, EP outpatient. She endorses having 3 weeks of lower extremity swelling which she attributes to getting new shoes.  In the ED, it was found that they had heart rate irregular and in the 160s, blood pressure 150/90, oxygen saturations 95% on room air, temperature 98 F.  Heart rate improved to 90s on diltiazem  drip.  Significant findings included: Hemoglobin 16.4, WBC 9.2, platelets 371.  Unremarkable metabolic panel.  K+ 3.7.  Troponin 7.  Magnesium  2.1 No active disease on chest x-ray.  They were initially treated with diltiazem  bolus followed by drip.   Patient was admitted to medicine service for further workup and management of A-fib RVR as outlined in detail below.  Assessment/Plan Principal Problem:   Atrial fibrillation, rapid (HCC)   Atrial fibrillation RVR-heart rate 160s on presentation resistant to home as needed medications.  Heart rate improved to 80s to 90s while on diltiazem  drip and her symptoms have greatly improved.  Endorses good adherence with her home medications.  Thinks that there could be possible stress-induced contribution to episode.  K+ 3.7 on presentation.  Goal of 4 - Potassium supplementation. - Continue diltiazem  drip - Cardiology consulted, would likely benefit from EP evaluation and possible repeat ablation.  She has been adherent with her anticoagulation. - continue home Tikosyn   Lower extremity edema-last echo evaluation was TEE in 2016  showed essentially normal cardiac function.  Currently has pitting lower extremity edema for 3 weeks as well as longstanding history of hypertension and now describing shortness of breath with exertion.  Certainly, partially these acute symptoms can be attributed to A-fib RVR but cannot rule out acute CHF exacerbation compounding the issue. - TTE ordered - One-time IV Lasix  trial - Follow-up cardiology recommendations - Daily  weights, strict I's/O - BMP a.m.  HTN-well-controlled and not currently taking antihypertensives  HLD - Continue statin  Type 2 diabetes melitis-medication controlled with glipizide  and tirzepatide .  Glucose 140 on presentation - Add on hemoglobin A1c and can consider adding on sliding scale if elevated.  Past Medical History:  Diagnosis Date   Diabetes mellitus    pt is unaware and is on no medicine for this (A1C 5.9 2012)   Dyslipidemia    Hypertension    Overweight    Paroxysmal atrial fibrillation (HCC)    chads2vasc score of at least 2   Snoring    cancelled prior sleep study due to costs   Typical atrial flutter [facility])     Past Surgical History:  Procedure Laterality Date   APPENDECTOMY     ATRIAL FIBRILLATION ABLATION N/A 07/26/2014   Procedure: ATRIAL FIBRILLATION ABLATION;  Surgeon: Jolly Needle, MD;  Location: [facility] CATH LAB;  Service: Cardiovascular;  Laterality: N/A;   BREAST LUMPECTOMY     benign   CARDIOVERSION N/A 07/03/2014   Procedure: CARDIOVERSION;  Surgeon: Deena Farrier, MD;  Location: [facility] OR;  Service: Cardiovascular;  Laterality: N/A;   KNEE SURGERY     REPLACEMENT TOTAL KNEE     bilateral, different times   TEE WITHOUT CARDIOVERSION N/A 07/26/2014   Procedure: TRANSESOPHAGEAL ECHOCARDIOGRAM (TEE);  Surgeon: Lenise Quince, MD;  Location: [facility] ENDOSCOPY;  Service: Cardiovascular;  Laterality: N/A;   TONSILLECTOMY AND ADENOIDECTOMY       reports that she has never smoked. She has never used smokeless tobacco. She reports that she does not drink alcohol and does not use drugs.  Allergies  Allergen Reactions   Shellfish-Derived Products Itching and Swelling        Fish Allergy Itching and Swelling   Iodine Other (See Comments)    Unknown childhood reaction to topical iodine   Contrast Media [Iodinated Contrast Media] Other (See Comments)    Unknown reaction to topical iodine as a child   Sulfonamide Derivatives Itching and Rash    Family History   Problem Relation Age of Onset   Heart attack Mother    Heart disease Father        cabg x 7   Heart attack Father    Stroke Father    Breast cancer Neg Hx     Prior to Admission medications   Medication Sig Start Date End Date Taking? Authorizing Provider  acetaminophen  (TYLENOL ) 650 MG CR tablet Take 650 mg by mouth at bedtime as needed for pain.    [provider]  ALPRAZolam  (XANAX ) 0.25 MG tablet Take 1 tablet (0.25 mg total) by mouth 2 (two) times daily as needed for anxiety. 08/30/22   Bedsole, Amy E, MD  atorvastatin  (LIPITOR) 10 MG tablet Take 1 tablet (10 mg total) by mouth daily. 11/29/22   Bedsole, Amy E, MD  cyclobenzaprine  (FLEXERIL ) 10 MG tablet Take 0.5-1 tablets (5-10 mg total) by mouth at bedtime as needed for muscle spasms. 07/08/23   Bedsole, Amy E, MD  dabigatran  ([name] ) 150 MG CAPS capsule Take 1 capsule (150 mg  total) by mouth 2 (two) times daily. 02/10/23   Tammie Fall, MD  diltiazem  (CARDIZEM  CD) 120 MG 24 hr capsule Take 1 capsule (120 mg total) by mouth daily. 11/13/22   Nathanel Bal, PA-C  diltiazem  (CARDIZEM ) 30 MG tablet Take 1 tablet (30 mg total) by mouth every 6 (six) hours as needed (for persistant heart rate greater than 120). 10/29/22   Ursuy, [name] Lynn, PA-C  dofetilide  (TIKOSYN ) 250 MCG capsule Take 1 capsule (250 mcg total) by mouth 2 (two) times daily. 10/25/22   Fenton, Clint R, PA  glipiZIDE  (GLUCOTROL  XL) 5 MG 24 hr tablet Take 2 tablets (10 mg total) by mouth daily. 02/21/23   Bedsole, Amy E, MD  tirzepatide  (MOUNJARO ) 2.5 MG/0.5ML Pen Inject 2.5 mg into the skin once a week. 11/29/22   Bedsole, Amy E, MD  traMADol  (ULTRAM ) 50 MG tablet Take 1 tablet (50 mg total) by mouth every 12 (twelve) hours as needed. 07/08/23   Judithann Novas, MD   I have personally, briefly reviewed patient's prior medical records in [facility]  Objective: Blood pressure (!) 150/90, pulse (!) 160, temperature 98 F (36.7 C), temperature source Oral, resp.  rate (!) 26, SpO2 100%.   Constitutional: NAD, calm, comfortable HEENT: lids and conjunctivae normal. MMM. Poor dentition  Neck: normal, supple, no masses, no thyromegaly Respiratory: CTAB, no wheezing, no crackles. Normal respiratory effort. No accessory muscle use.  Cardiovascular: Rapid and irregular rate, no murmurs / rubs / gallops. Bilateral 2+ pitting lower extremity edema to knees. 2+ pedal pulses. no clubbing / cyanosis.  Abdomen: soft, NT, ND, no masses or HSM palpated. Musculoskeletal: No joint deformity upper and lower extremities. Normal muscle tone. Post-surgical scars on knees Skin: dry, intact, normal color, normal temperature on exposed skin Neurologic: Alert and oriented x 3. Normal speech. Grossly non-focal exam. PERRL Psychiatric: Normal mood. Congruent affect.  Labs on Admission: I have personally reviewed admission labs and imaging studies  CBC    Component Value Date/Time   WBC 9.2 07/20/2023 2109   RBC 5.95 (H) 07/20/2023 2109   HGB 16.4 (H) 07/20/2023 2109   HGB 13.3 11/29/2022 1020   HCT 49.3 (H) 07/20/2023 2109   HCT 40.8 11/29/2022 1020   PLT 371 07/20/2023 2109   PLT 360 11/29/2022 1020   MCV 82.9 07/20/2023 2109   MCV 84 11/29/2022 1020   MCV 83 07/02/2014 0526   MCH 27.6 07/20/2023 2109   MCHC 33.3 07/20/2023 2109   RDW 13.6 07/20/2023 2109   RDW 13.2 11/29/2022 1020   RDW 14.5 07/02/2014 0526   LYMPHSABS 3.6 07/20/2023 2109   MONOABS 0.7 07/20/2023 2109   EOSABS 0.4 07/20/2023 2109   BASOSABS 0.1 07/20/2023 2109   CMP     Component Value Date/Time   NA 138 07/20/2023 2109   NA 141 04/09/2023 1613   NA 137 07/02/2014 0526   K 3.7 07/20/2023 2109   K 3.8 07/02/2014 0526   CL 104 07/20/2023 2109   CL 105 07/02/2014 0526   CO2 23 07/20/2023 2109   CO2 26 07/02/2014 0526   GLUCOSE 140 (H) 07/20/2023 2109   GLUCOSE 175 (H) 07/02/2014 0526   BUN 13 07/20/2023 2109   BUN 11 04/09/2023 1613   BUN 12 07/02/2014 0526   CREATININE 0.71  07/20/2023 2109   CREATININE 0.86 07/02/2014 0526   CALCIUM  9.6 07/20/2023 2109   CALCIUM  8.9 07/02/2014 0526   PROT 8.1 07/20/2023 2109   PROT 7.0  11/29/2022 1020   ALBUMIN 4.2 07/20/2023 2109   ALBUMIN 4.1 11/29/2022 1020   AST 20 07/20/2023 2109   ALT 14 07/20/2023 2109   ALKPHOS 54 07/20/2023 2109   BILITOT 0.6 07/20/2023 2109   BILITOT 0.5 11/29/2022 1020   GFRNONAA >60 07/20/2023 2109   GFRNONAA >60 07/02/2014 0526   GFRAA >90 07/27/2014 0346   GFRAA >60 07/02/2014 0526   Radiological Exams on Admission: DG Chest Portable 1 View Result Date: 07/20/2023 CLINICAL DATA:  Chest pain EXAM: PORTABLE CHEST 1 VIEW COMPARISON:  11/11/2022 FINDINGS: The heart size and mediastinal contours are within normal limits. Both lungs are clear. The visualized skeletal structures are unremarkable. IMPRESSION: No active disease. Electronically Signed   By: Esmeralda Hedge M.D.   On: 07/20/2023 22:32   EKG: Independently reviewed. Afib HR 92  DVT prophylaxis: pradaxa   Code Status: full  Family Communication: daughter at bedside  Disposition Plan: cardiac tele admit   Consults called: Dr. Gollan, cardiology   Ree Candy, DO [facility]  07/20/2023, 10:40 PM    To contact the appropriate [facility] Attending or Consulting provider: Check [email or web address] for coverage from 7pm-7am

## 2023-07-20 NOTE — ED Provider Notes (Signed)
 [facility] Provider Note    Event Date/Time   First MD Initiated Contact with Patient 07/20/23 2107     (approximate)   History   Chief Complaint Palpitations   HPI  [name] Strickland is a 73 y.o. female with past medical history of hypertension, hyperlipidemia, diabetes, and atrial fibrillation on [name]  who presents to the ED complaining of palpitations.  Patient reports that since last night around 11:30 PM, she has been feeling like her heart has been racing with some shortness of breath.  She reports occasional lightheadedness as well as occasional discomfort in her chest, but has not have any chest pain currently.  Symptoms have been similar to prior episodes of atrial fibrillation, she reports her heart rate getting as high as the 160s at home.  She has been compliant with her diltiazem  and [name]  dosing.  She denies any fevers, cough, nausea, vomiting, diarrhea, or dysuria.      Physical Exam   Triage Vital Signs: ED Triage Vitals  Encounter Vitals Group     BP      Systolic BP Percentile      Diastolic BP Percentile      Pulse      Resp      Temp      Temp src      SpO2      Weight      Height      Head Circumference      Peak Flow      Pain Score      Pain Loc      Pain Education      Exclude from Growth Chart     Most recent vital signs: Vitals:   07/20/23 2108 07/20/23 2113  BP: (!) 150/90   Pulse: (!) 160   Resp: (!) 26   Temp: 98 F (36.7 C)   SpO2: 95% 100%    Constitutional: Alert and oriented. Eyes: Conjunctivae are normal. Head: Atraumatic. Nose: No congestion/rhinnorhea. Mouth/Throat: Mucous membranes are moist.  Cardiovascular: Tachycardic, irregularly irregular rhythm. Grossly normal heart sounds.  2+ radial pulses bilaterally. Respiratory: Normal respiratory effort.  No retractions. Lungs CTAB. Gastrointestinal: Soft and nontender. No distention. Musculoskeletal: No lower extremity tenderness nor edema.   Neurologic:  Normal speech and language. No gross focal neurologic deficits are appreciated.    ED Results / Procedures / Treatments   Labs (all labs ordered are listed, but only abnormal results are displayed) Labs Reviewed  CBC WITH DIFFERENTIAL/PLATELET - Abnormal; Notable for the following components:      Result Value   RBC 5.95 (*)    Hemoglobin 16.4 (*)    HCT 49.3 (*)    All other components within normal limits  COMPREHENSIVE METABOLIC PANEL WITH GFR - Abnormal; Notable for the following components:   Glucose, Bld 140 (*)    All other components within normal limits  MAGNESIUM   TROPONIN I (HIGH SENSITIVITY)     EKG  ED ECG REPORT I, Twilla Galea, the attending physician, personally viewed and interpreted this ECG.   Date: 07/20/2023  EKG Time: 21:05  Rate: 150  Rhythm: atrial fibrillation  Axis: LAD  Intervals:none  ST&T Change: None  RADIOLOGY Chest x-ray reviewed and interpreted by me with no infiltrate, edema, or effusion.  PROCEDURES:  Critical Care performed: Yes, see critical care procedure note(s)  .Critical Care  Performed by: Twilla Galea, MD Authorized by: Twilla Galea, MD   Critical care provider statement:  Critical care time (minutes):  30   Critical care time was exclusive of:  Separately billable procedures and treating other patients and teaching time   Critical care was necessary to treat or prevent imminent or life-threatening deterioration of the following conditions:  Cardiac failure   Critical care was time spent personally by me on the following activities:  Development of treatment plan with patient or surrogate, discussions with consultants, evaluation of patient's response to treatment, examination of patient, ordering and review of laboratory studies, ordering and review of radiographic studies, ordering and performing treatments and interventions, pulse oximetry, re-evaluation of patient's condition and review of old  charts   I assumed direction of critical care for this patient from another provider in my specialty: no     Care discussed with: admitting provider      MEDICATIONS ORDERED IN ED: Medications  diltiazem  (CARDIZEM ) 125 mg in dextrose  5% 125 mL (1 mg/mL) infusion (5 mg/hr Intravenous New Bag/Given 07/20/23 2137)  diltiazem  (CARDIZEM ) injection 10 mg (10 mg Intravenous Given 07/20/23 2127)     IMPRESSION / MDM / ASSESSMENT AND PLAN / ED COURSE  I reviewed the triage vital signs and the nursing notes.                              73 y.o. female with past medical history of hypertension, hyperlipidemia, diabetes, and atrial fibrillation on [name]  who presents to the ED complaining of about 24 hours of palpitations, shortness of breath, and chest pain.  Patient's presentation is most consistent with acute presentation with potential threat to life or bodily function.  Differential diagnosis includes, but is not limited to, arrhythmia, ACS, PE, CHF, pneumonia, pneumothorax, anemia, electrolyte abnormality, AKI.  Patient nontoxic-appearing and in no acute distress, vital signs remarkable for tachycardia but otherwise reassuring.  She is not in any respiratory distress and maintaining oxygen saturations at 100% on room air.  EKG shows atrial fibrillation with RVR, no ischemic changes noted.  Patient was started on IV diltiazem  bolus with drip, heart rate now improved into the 100s.  Labs are reassuring with no significant anemia, leukocytosis, electrolyte abnormality, or AKI.  Chest x-ray unremarkable by my read.  Case discussed with hospitalist for admission.      FINAL CLINICAL IMPRESSION(S) / ED DIAGNOSES   Final diagnoses:  Atrial fibrillation with RVR (HCC)     Rx / DC Orders   ED Discharge Orders     None        Note:  This document was prepared using Dragon voice recognition software and may include unintentional dictation errors.   Twilla Galea, MD 07/20/23 2210

## 2023-07-20 NOTE — ED Triage Notes (Signed)
 Pt to ed from home via POV for uncontrolled AFIB, CP and SOB. Pt advised she has been feeling bad all afternoon and has had to be cardioverted previously.

## 2023-07-21 ENCOUNTER — Inpatient Hospital Stay (HOSPITAL_COMMUNITY)
Admit: 2023-07-21 | Discharge: 2023-07-21 | Disposition: A | Discharge status: Home / Self Care | Attending: Student in an Organized Health Care Education/Training Program | Admitting: Student in an Organized Health Care Education/Training Program

## 2023-07-21 DIAGNOSIS — I5031 Acute diastolic (congestive) heart failure: Secondary | ICD-10-CM

## 2023-07-21 DIAGNOSIS — I4891 Unspecified atrial fibrillation: Secondary | ICD-10-CM | POA: Diagnosis not present

## 2023-07-21 LAB — BRAIN NATRIURETIC PEPTIDE: B Natriuretic Peptide: 269.7 pg/mL — ABNORMAL HIGH (ref 0.0–100.0)

## 2023-07-21 LAB — CBC
HCT: 45.6 % (ref 36.0–46.0)
Hemoglobin: 15.2 g/dL — ABNORMAL HIGH (ref 12.0–15.0)
MCH: 27.4 pg (ref 26.0–34.0)
MCHC: 33.3 g/dL (ref 30.0–36.0)
MCV: 82.3 fL (ref 80.0–100.0)
Platelets: 303 10*3/uL (ref 150–400)
RBC: 5.54 MIL/uL — ABNORMAL HIGH (ref 3.87–5.11)
RDW: 13.6 % (ref 11.5–15.5)
WBC: 7.9 10*3/uL (ref 4.0–10.5)
nRBC: 0 % (ref 0.0–0.2)

## 2023-07-21 LAB — BASIC METABOLIC PANEL WITH GFR
Anion gap: 10 (ref 5–15)
BUN: 12 mg/dL (ref 8–23)
CO2: 22 mmol/L (ref 22–32)
Calcium: 9.1 mg/dL (ref 8.9–10.3)
Chloride: 107 mmol/L (ref 98–111)
Creatinine, Ser: 0.72 mg/dL (ref 0.44–1.00)
GFR, Estimated: >60 mL/min (ref >60)
Glucose, Bld: 144 mg/dL — ABNORMAL HIGH (ref 70–99)
Potassium: 3.7 mmol/L (ref 3.5–5.1)
Sodium: 139 mmol/L (ref 135–145)

## 2023-07-21 LAB — HEMOGLOBIN A1C
Hgb A1c MFr Bld: 6.5 % — ABNORMAL HIGH (ref 4.8–5.6)
Mean Plasma Glucose: 139.85 mg/dL

## 2023-07-21 LAB — TSH: TSH: 4.624 u[IU]/mL — ABNORMAL HIGH (ref 0.350–4.500)

## 2023-07-21 LAB — GLUCOSE, CAPILLARY: Glucose-Capillary: 151 mg/dL — ABNORMAL HIGH (ref 70–99)

## 2023-07-21 MED ORDER — ORAL CARE MOUTH RINSE: 15.0000 mL | OROMUCOSAL | Status: DC | PRN

## 2023-07-21 MED ORDER — DOFETILIDE 250 MCG PO CAPS
250.0000 ug | ORAL_CAPSULE | Freq: Two times a day (BID) | ORAL | Status: DC
  Administered 2023-07-21 – 2023-07-22 (×3): 250 ug via ORAL
  Filled 2023-07-21 (×4): qty 1

## 2023-07-21 MED ORDER — SODIUM CHLORIDE 0.9 % IV SOLN: INTRAVENOUS | Status: DC

## 2023-07-21 MED ORDER — DABIGATRAN ETEXILATE MESYLATE 150 MG PO CAPS
150.0000 mg | ORAL_CAPSULE | Freq: Two times a day (BID) | ORAL | Status: DC
  Administered 2023-07-21 – 2023-07-22 (×4): 150 mg via ORAL
  Filled 2023-07-21 (×4): qty 1

## 2023-07-21 NOTE — Progress Notes (Signed)
 Pt is transferred fro ED to [id number]. She is alert and fully oriented x 4, pleasant, afebrile, Afib on the monitor, HR under controlled, Hemodynamically stable, on room air , no obvious acute distress at arrival. Continue on Cardizem  5 mg/hr gtt.   She is able to ambulate independently to the bathroom. Well tolerated, no chest pain or SOB.   Pt requests MD to verify her Tikosyn  which is her current medication from home. She is very anxious to miss any dose of her routine home meds for her Afib. Dr.Anderson is aware and reviewing her current home medications.   Pt has her medications from home and refuses to deposit at our main pharmacy per [facility] policy. She will let her daughter take them back home by tomorrow. We will continue to monitor.    Brain Cahill, RN

## 2023-07-21 NOTE — Progress Notes (Addendum)
 Afib, HR 137 when Pt ambulates to the bathroom frequently due to getting Lasix  tonight. At rest HR 80s-90s. BP remains stable. Denies SOB, chest pain or heart palpitations. We will monitor.  Brain Cahill, RN

## 2023-07-21 NOTE — Consult Note (Signed)
 Cardiology Consultation   Patient ID: [name] Strickland MRN: [id number]; DOB: January 11, 1951  Admit date: 07/20/2023 Date of Consult: 07/21/2023  PCP:  Judithann Novas, MD   [facility] Providers Cardiologist:  None  Electrophysiologist:  Manya Sells, MD       Patient Profile:   [name] Strickland is a 73 y.o. female with a hx of paroxysmal atrial fibrillation on [name] , hypertension, hyperlipidemia, and T2DM who is being seen 07/21/2023 for the evaluation of atrial fibrillation with RVR at the request of Dr. Alva Jewels.  History of Present Illness:   [name] Strickland has a longstanding history of paroxysmal atrial fibrillation diagnosed in 2012. She follows with Dr. Pete Brand for EP. PVI and CTI ablation 07/2014. Flecanide discontinued 07/2022 with failure to maintain sinus rhythm.  Admitted 09/2022 with afib, converted with drug SB, which prompted d/c of her diltiazem . Recurrent afib as outpatient, recommended PRN diltiazem  given bradycardia in sinus rhythm. Remained in/out of afib by symptoms. Transitioned by afib clinic to diltiazem  120 mg daily. Started on Tikosyn  09/2022. She was most recently seen by PA Mertha Abrahams for follow up on afib and was doing well. Remained in sinus rhythm at that time.   Patient reports that yesterday morning she started to feel as if she was back in atrial fibrillation with symptoms of heart racing, dyspnea on exertion, and lightheadedness. She is strictly compliant with her medications and has not missed any doses of her CCB, antiarrhythmic or DOAC. She denies chest pain, bleeding, orthopnea, and lower extremity swelling. She endorses some stress surrounding the day, as it was her birthday and a holiday. She tells me she has had several family members pass away on holidays which causes some anxiety on these days. She was otherwise in her normal state of health without cough, fever, nausea, vomiting, and diarrhea. She took her morning medications and started to feel better with  improvements in HR. However, later in the day she got up to use the bathroom and again felt lightheaded and short of breath. She took a PRN diltiazem  which did not improve her rate. She used her pulse oximeter which read her pulse as 155 bpm and decided to report to the ED. In the ED, BP 150/90, HR 160 bpm, RR 26 with otherwise normal vital signs. Labs significant for K 3.7, largely unremarkable CBC and CMP. Troponin negative. TSH elevated at 4.6. CXR without acute process. EKG shows atrial fibrillation with RVR, no ischemic changes. She was started on a diltiazem  drip with HR improving into the 100s.   Past Medical History:  Diagnosis Date   Diabetes mellitus    pt is unaware and is on no medicine for this (A1C 5.9 2012)   Dyslipidemia    Hypertension    Overweight    Paroxysmal atrial fibrillation (HCC)    chads2vasc score of at least 2   Snoring    cancelled prior sleep study due to costs   Typical atrial flutter [facility])     Past Surgical History:  Procedure Laterality Date   APPENDECTOMY     ATRIAL FIBRILLATION ABLATION N/A 07/26/2014   Procedure: ATRIAL FIBRILLATION ABLATION;  Surgeon: Jolly Needle, MD;  Location: [facility] CATH LAB;  Service: Cardiovascular;  Laterality: N/A;   BREAST LUMPECTOMY     benign   CARDIOVERSION N/A 07/03/2014   Procedure: CARDIOVERSION;  Surgeon: Deena Farrier, MD;  Location: [facility] OR;  Service: Cardiovascular;  Laterality: N/A;   KNEE SURGERY     REPLACEMENT TOTAL  KNEE     bilateral, different times   TEE WITHOUT CARDIOVERSION N/A 07/26/2014   Procedure: TRANSESOPHAGEAL ECHOCARDIOGRAM (TEE);  Surgeon: Lenise Quince, MD;  Location: [facility] ENDOSCOPY;  Service: Cardiovascular;  Laterality: N/A;   TONSILLECTOMY AND ADENOIDECTOMY         Inpatient Medications: Scheduled Meds:  atorvastatin   10 mg Oral Daily   dabigatran   150 mg Oral BID   dofetilide   250 mcg Oral BID   polyethylene glycol  17 g Oral Daily   Continuous Infusions:  diltiazem  (CARDIZEM ) infusion  5 mg/hr (07/20/23 2137)   PRN Meds: acetaminophen  **OR** acetaminophen , albuterol , ALPRAZolam , bisacodyl , cyclobenzaprine , ondansetron  **OR** ondansetron  (ZOFRAN ) IV, mouth rinse  Allergies:    Allergies  Allergen Reactions   Shellfish-Derived Products Itching and Swelling        Fish Allergy Itching and Swelling   Iodine Other (See Comments)    Unknown childhood reaction to topical iodine   Contrast Media [Iodinated Contrast Media] Other (See Comments)    Unknown reaction to topical iodine as a child   Sulfonamide Derivatives Itching and Rash    Social History:   Social History   Socioeconomic History   Marital status: Widowed    Spouse name: Not on file   Number of children: Not on file   Years of education: Not on file   Highest education level: Associate degree: occupational, Scientist, product/process development, or vocational program  Occupational History   Not on file  Tobacco Use   Smoking status: Never   Smokeless tobacco: Never  Vaping Use   Vaping status: Never Used  Substance and Sexual Activity   Alcohol use: No   Drug use: No   Sexual activity: Not on file  Other Topics Concern   Not on file  Social History Narrative   Pt lives in [place] with husband daughter.  Her husband is chronically ill with CHF and morbid obesity (420 lbs). She provides total care for him.   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   Social Drivers of Corporate investment banker Strain: Low Risk  (02/21/2023)   Overall Financial Resource Strain (CARDIA)    Difficulty of Paying Living Expenses: Not very hard  Food Insecurity: No Food Insecurity (07/21/2023)   Hunger Vital Sign    Worried About Running Out of Food in the Last Year: Never true    Ran Out of Food in the Last Year: Never true  Transportation Needs: No Transportation Needs (07/21/2023)   PRAPARE - Administrator, Civil Service (Medical): No    Lack of Transportation (Non-Medical): No  Physical Activity: Insufficiently  Active (02/21/2023)   Exercise Vital Sign    Days of Exercise per Week: 3 days    Minutes of Exercise per Session: 20 min  Stress: No Stress Concern Present (02/21/2023)   [employer] of Occupational Health - Occupational Stress Questionnaire    Feeling of Stress : Not at all  Social Connections: Moderately Isolated (07/21/2023)   Social Connection and Isolation Panel [NHANES]    Frequency of Communication with Friends and Family: More than three times a week    Frequency of Social Gatherings with Friends and Family: More than three times a week    Attends Religious Services: More than 4 times per year    Active Member of [employer] or Organizations: No    Attends Banker Meetings: Never    Marital Status: Widowed  Intimate Partner Violence: Not At Risk (07/21/2023)  Humiliation, Afraid, Rape, and Kick questionnaire    Fear of Current or Ex-Partner: No    Emotionally Abused: No    Physically Abused: No    Sexually Abused: No    Family History:    Family History  Problem Relation Age of Onset   Heart attack Mother    Heart disease Father        cabg x 7   Heart attack Father    Stroke Father    Breast cancer Neg Hx      ROS:  Please see the history of present illness.   Physical Exam/Data:   Vitals:   07/21/23 0259 07/21/23 0526 07/21/23 0554 07/21/23 0731  BP: (!) 124/94 127/76  126/72  Pulse: 87 94  93  Resp: 18 18 19 13   Temp: 97.8 F (36.6 C) 97.7 F (36.5 C)  98.4 F (36.9 C)  TempSrc: Oral     SpO2: 99% 96%  98%  Weight:        Intake/Output Summary (Last 24 hours) at 07/21/2023 0908 Last data filed at 07/21/2023 0500 Gross per 24 hour  Intake 286.6 ml  Output --  Net 286.6 ml      07/21/2023    1:01 AM 04/09/2023    3:36 PM 02/21/2023    2:04 PM  Last 3 Weights  Weight (lbs) 236 lb 234 lb 3.2 oz 229 lb  Weight (kg) 107.049 kg 106.232 kg 103.874 kg     Body mass index is 35.88 kg/m.  General:  Well nourished, well developed, in no  acute distress HEENT: normal Neck: no JVD Vascular: No carotid bruits; Distal pulses 2+ bilaterally Cardiac:  normal S1, S2; IRIR; no murmur  Lungs:  clear to auscultation bilaterally, no wheezing, rhonchi or rales  Abd: soft, nontender, no hepatomegaly  Ext: no edema Skin: warm and dry  Psych:  Normal affect   EKG:  The EKG was personally reviewed and demonstrates:  atrial fibrillation, rate 150 bpm Telemetry:  Telemetry was personally reviewed and demonstrates:  atrial fibrillation with rate 80-100 bpm on average, up to 130s bpm when patient is up and moving  Relevant CV Studies:  07/12/2014 Echo complete - Left ventricle: The cavity size was normal. Wall thickness was    normal. Systolic function was normal. The estimated ejection    fraction was in the range of 55% to 60%. Wall motion was normal;    there were no regional wall motion abnormalities. Doppler    parameters are consistent with abnormal left ventricular    relaxation (grade 1 diastolic dysfunction).  - Aortic valve: There was no stenosis.  - Mitral valve: Mildly calcified annulus. There was trivial    regurgitation.  - Left atrium: The atrium was moderately dilated.  - Right ventricle: The cavity size was normal. Systolic function    was normal.  - Right atrium: The atrium was mildly dilated.  - Pulmonary arteries: No complete TR doppler jet so unable to    estimate PA systolic pressure.  - Inferior vena cava: The vessel was normal in size. The    respirophasic diameter changes were in the normal range (>= 50%),    consistent with normal central venous pressure.   Laboratory Data:  High Sensitivity Troponin:   Recent Labs  Lab 07/20/23 2109 07/20/23 2247  TROPONINIHS 7 6     Chemistry Recent Labs  Lab 07/20/23 2109 07/21/23 0529  NA 138 139  K 3.7 3.7  CL 104 107  CO2  23 22  GLUCOSE 140* 144*  BUN 13 12  CREATININE 0.71 0.72  CALCIUM  9.6 9.1  MG 2.1  --   GFRNONAA >60 >60  ANIONGAP 11 10     Recent Labs  Lab 07/20/23 2109  PROT 8.1  ALBUMIN 4.2  AST 20  ALT 14  ALKPHOS 54  BILITOT 0.6   Lipids No results for input(s): "CHOL", "TRIG", "HDL", "LABVLDL", "LDLCALC", "CHOLHDL" in the last 168 hours.  Hematology Recent Labs  Lab 07/20/23 2109 07/21/23 0529  WBC 9.2 7.9  RBC 5.95* 5.54*  HGB 16.4* 15.2*  HCT 49.3* 45.6  MCV 82.9 82.3  MCH 27.6 27.4  MCHC 33.3 33.3  RDW 13.6 13.6  PLT 371 303   Thyroid  Recent Labs  Lab 07/20/23 2247  TSH 4.624*    BNPNo results for input(s): "BNP", "PROBNP" in the last 168 hours.  DDimer No results for input(s): "DDIMER" in the last 168 hours.   Radiology/Studies:  DG Chest Portable 1 View Result Date: 07/20/2023 CLINICAL DATA:  Chest pain EXAM: PORTABLE CHEST 1 VIEW COMPARISON:  11/11/2022 FINDINGS: The heart size and mediastinal contours are within normal limits. Both lungs are clear. The visualized skeletal structures are unremarkable. IMPRESSION: No active disease. Electronically Signed   By: Esmeralda Hedge M.D.   On: 07/20/2023 22:32     Assessment and Plan:   Atrial fibrillation with RVR - Presented 4/20 with DOE, lightheadedness, and palpitations. Took PRN diltiazem  at home without improvement.  - Initial EKG shows atrial fibrillation with rate 150 bpm - Given diltiazem  bolus and started on drip with improvement in rates - Remains in atrial fibrillation with rates 80-100 bpm on average, up to 130s bpm when patient is up and moving - CHA2DS2VASc of at least 4 (age, sex category, HTN, DM) - Continue [name]  150 mg twice daily, has not missed any doses - Continue Tikosyn  for now, missed yesterday evening dose despite patient requesting it several times from nursing staff - Continue diltiazem  drip for rate control - Plan for DCCV tomorrow if patient remains in atrial fibrillation, NPO after midnight - Will reach out to EP for consult to weigh in on antiarrhythmic options  Lower extremity edema - Patient reportedly  had pitting edema on presentation yesterday, although she denies this - Received IV Lasix  20 mg overnight with no output recorded. Patient reports good UOP.  - BNP mildly elevated at 269 - Appears euvolemic on my exam - Echo ordered - Will hold off on further diuresis  Hypertension - BP elevated on presentation, now well controlled - Continue diltiazem  as above  Hyperlipidemia - Continue atorvastatin   T2DM - A1C 6.5, management per IM  Informed Consent   Shared Decision Making/Informed Consent The risks (stroke, cardiac arrhythmias rarely resulting in the need for a temporary or permanent pacemaker, skin irritation or burns and complications associated with conscious sedation including aspiration, arrhythmia, respiratory failure and death), benefits (restoration of normal sinus rhythm) and alternatives of a direct current cardioversion were explained in detail to Ms. Schreur and she agrees to proceed.       For questions or updates, please contact [facility] Please consult [email or web address] for contact info under    Signed, Brodie Cannon, PA-C  07/21/2023 9:08 AM

## 2023-07-21 NOTE — Progress Notes (Signed)
 PROGRESS NOTE    [name] Strickland  EAV:[id number] DOB: 1951/01/14 DOA: 07/20/2023 PCP: Judithann Novas, MD   Assessment & Plan:   Principal Problem:   Atrial fibrillation, rapid (HCC)  Assessment and Plan:  Likely PAF: w/ RVR. Continue on IV diltiazem  drip, tikosyn  & pradaxa . Hx of ablation w/o any improvement of a. fib. Continue on tele. Cardio consulted    Lower extremity edema: etiology unclear, CHF vs chronic venous insufficiency. Echo ordered. S/p IV lasix  x 1   HLD: continue on statin    DM2: well controlled, HbA1c 6.5 Diet controlled      DVT prophylaxis: pradaxa   Code Status: full  Family Communication:  Disposition Plan: likely d/c back home.  Level of care: Telemetry Cardiac  Status is: Inpatient Remains inpatient appropriate because: severity of illness    Consultants:  Cardio   Procedures:   Antimicrobials:    Subjective: Pt c/o fatigue   Objective: Vitals:   07/21/23 0259 07/21/23 0526 07/21/23 0554 07/21/23 0731  BP: (!) 124/94 127/76  126/72  Pulse: 87 94  93  Resp: 18 18 19 13   Temp: 97.8 F (36.6 C) 97.7 F (36.5 C)  98.4 F (36.9 C)  TempSrc: Oral     SpO2: 99% 96%  98%  Weight:        Intake/Output Summary (Last 24 hours) at 07/21/2023 0833 Last data filed at 07/21/2023 0500 Gross per 24 hour  Intake 286.6 ml  Output --  Net 286.6 ml   Filed Weights   07/21/23 0101  Weight: 107 kg    Examination:  General exam: Appears calm and comfortable  Respiratory system: Clear to auscultation. Respiratory effort normal. Cardiovascular system: irregularly irregular. No rubs, gallops or clicks.  Gastrointestinal system: Abdomen is obese, soft and nontender. Normal bowel sounds heard. Central nervous system: Alert and oriented. Moves all extremities  Psychiatry: Judgement and insight appear normal. Mood & affect appropriate.     Data Reviewed: I have personally reviewed following labs and imaging studies  CBC: Recent Labs  Lab  07/20/23 2109 07/21/23 0529  WBC 9.2 7.9  NEUTROABS 4.5  --   HGB 16.4* 15.2*  HCT 49.3* 45.6  MCV 82.9 82.3  PLT 371 303   Basic Metabolic Panel: Recent Labs  Lab 07/20/23 2109 07/21/23 0529  NA 138 139  K 3.7 3.7  CL 104 107  CO2 23 22  GLUCOSE 140* 144*  BUN 13 12  CREATININE 0.71 0.72  CALCIUM  9.6 9.1  MG 2.1  --    GFR: Estimated Creatinine Clearance: 82.6 mL/min (by C-G formula based on SCr of 0.72 mg/dL). Liver Function Tests: Recent Labs  Lab 07/20/23 2109  AST 20  ALT 14  ALKPHOS 54  BILITOT 0.6  PROT 8.1  ALBUMIN 4.2   No results for input(s): "LIPASE", "AMYLASE" in the last 168 hours. No results for input(s): "AMMONIA" in the last 168 hours. Coagulation Profile: No results for input(s): "INR", "PROTIME" in the last 168 hours. Cardiac Enzymes: No results for input(s): "CKTOTAL", "CKMB", "CKMBINDEX", "TROPONINI" in the last 168 hours. BNP (last 3 results) No results for input(s): "PROBNP" in the last 8760 hours. HbA1C: Recent Labs    07/20/23 2109  HGBA1C 6.5*   CBG: No results for input(s): "GLUCAP" in the last 168 hours. Lipid Profile: No results for input(s): "CHOL", "HDL", "LDLCALC", "TRIG", "CHOLHDL", "LDLDIRECT" in the last 72 hours. Thyroid Function Tests: Recent Labs    07/20/23 2247  TSH 4.624*   Anemia  Panel: No results for input(s): "VITAMINB12", "FOLATE", "FERRITIN", "TIBC", "IRON", "RETICCTPCT" in the last 72 hours. Sepsis Labs: No results for input(s): "PROCALCITON", "LATICACIDVEN" in the last 168 hours.  No results found for this or any previous visit (from the past 240 hours).       Radiology Studies: DG Chest Portable 1 View Result Date: 07/20/2023 CLINICAL DATA:  Chest pain EXAM: PORTABLE CHEST 1 VIEW COMPARISON:  11/11/2022 FINDINGS: The heart size and mediastinal contours are within normal limits. Both lungs are clear. The visualized skeletal structures are unremarkable. IMPRESSION: No active disease.  Electronically Signed   By: Esmeralda Hedge M.D.   On: 07/20/2023 22:32        Scheduled Meds:  atorvastatin   10 mg Oral Daily   dabigatran   150 mg Oral BID   dofetilide   250 mcg Oral BID   polyethylene glycol  17 g Oral Daily   Continuous Infusions:  diltiazem  (CARDIZEM ) infusion 5 mg/hr (07/20/23 2137)     LOS: 1 day       Alphonsus Jeans, MD [facility] Pager [phone]  If 7PM-7AM, please contact night-coverage [email or web address] 07/21/2023, 8:33 AM

## 2023-07-21 NOTE — TOC Initial Note (Signed)
 Transition of Care [facility]) - Initial/Assessment Note    Patient Details  Name: [name] Strickland MRN: [id number] Date of Birth: 1951-03-04  Transition of Care [facility]) CM/SW Contact:    Arvetta Araque C Namine Beahm, RN Phone Number: 07/21/2023, 11:37 AM  Clinical Narrative:                 TOC continuing to follow patient's progress throughout discharge planning.        Patient Goals and CMS Choice            Expected Discharge Plan and Services                                              Prior Living Arrangements/Services                       Activities of Daily Living   ADL Screening (condition at time of admission) Independently performs ADLs?: Yes (appropriate for developmental age) Is the patient deaf or have difficulty hearing?: No Does the patient have difficulty seeing, even when wearing glasses/contacts?: No Does the patient have difficulty concentrating, remembering, or making decisions?: No  Permission Sought/Granted                  Emotional Assessment              Admission diagnosis:  Atrial fibrillation with RVR (HCC) [I48.91] Atrial fibrillation, rapid (HCC) [I48.91] Patient Active Problem List   Diagnosis Date Noted   Atrial fibrillation, rapid (HCC) 07/20/2023   Paroxysmal atrial fibrillation (HCC) 10/15/2022   Hypercoagulable state due to atrial fibrillation (HCC) 08/28/2022   Hyperlipidemia associated with type 2 diabetes mellitus (HCC) 11/27/2021   Type 2 diabetes mellitus with hyperglycemia, without long-term current use of insulin  (HCC) 11/30/2020   Essential hypertension 07/05/2014   Class 2 severe obesity due to excess calories with serious comorbidity and body mass index (BMI) of 38.0 to 38.9 in adult [facility]) 07/05/2014   Atrial fibrillation (HCC) 01/02/2009   PCP:  Judithann Novas, MD Pharmacy:   [facility] [address] [place] [address] Phone: [phone] Fax:  [phone]  [facility] [address] [place] [address] Phone: [phone] Fax: [phone]     Social Drivers of Health (SDOH) Social History: SDOH Screenings   Food Insecurity: No Food Insecurity (07/21/2023)  Housing: Low Risk  (07/21/2023)  Transportation Needs: No Transportation Needs (07/21/2023)  Utilities: Not At Risk (07/21/2023)  Depression (PHQ2-9): Low Risk  (11/11/2022)  Financial Resource Strain: Low Risk  (02/21/2023)  Physical Activity: Insufficiently Active (02/21/2023)  Social Connections: Moderately Isolated (07/21/2023)  Stress: No Stress Concern Present (02/21/2023)  Tobacco Use: Low Risk  (07/20/2023)   SDOH Interventions:     Readmission Risk Interventions     No data to display

## 2023-07-21 NOTE — Progress Notes (Signed)
 Pharmacy: Dofetilide  (Tikosyn ) - Follow Up Assessment and Electrolyte Replacement  73 y.o. female admitted on 07/20/2023 undergoing dofetilide  on 250mcg BID PTA. Therapy resumed on admission to acute care.   Labs:    Component Value Date/Time   K 3.7 07/21/2023 0529   K 3.8 07/02/2014 0526   MG 2.1 07/20/2023 2109   MG 2.0 07/02/2014 0526     Plan: Potassium: K 3.8-3.9:  Give KCl 40 mEq po x1  - already given per MD orders  Magnesium : Mg > 2: No additional supplementation needed   Will follow MA labs and re-assess replacement needs  Thank you for allowing pharmacy to participate in this patient's care   Laloni Rowton Rodriguez-Guzman PharmD, BCPS 07/21/2023 12:48 PM

## 2023-07-22 ENCOUNTER — Encounter
Admission: EM | Disposition: A | Discharge status: Home / Self Care | Payer: Self-pay | Source: Home / Self Care | Attending: Internal Medicine

## 2023-07-22 ENCOUNTER — Other Ambulatory Visit: Payer: Self-pay

## 2023-07-22 ENCOUNTER — Inpatient Hospital Stay: Admitting: Anesthesiology

## 2023-07-22 DIAGNOSIS — R0602 Shortness of breath: Secondary | ICD-10-CM

## 2023-07-22 DIAGNOSIS — I4891 Unspecified atrial fibrillation: Secondary | ICD-10-CM | POA: Diagnosis not present

## 2023-07-22 HISTORY — PX: CARDIOVERSION: SHX1299

## 2023-07-22 LAB — BASIC METABOLIC PANEL WITH GFR
Anion gap: 10 (ref 5–15)
BUN: 20 mg/dL (ref 8–23)
CO2: 22 mmol/L (ref 22–32)
Calcium: 9 mg/dL (ref 8.9–10.3)
Chloride: 106 mmol/L (ref 98–111)
Creatinine, Ser: 0.84 mg/dL (ref 0.44–1.00)
GFR, Estimated: >60 mL/min (ref >60)
Glucose, Bld: 182 mg/dL — ABNORMAL HIGH (ref 70–99)
Potassium: 3.8 mmol/L (ref 3.5–5.1)
Sodium: 138 mmol/L (ref 135–145)

## 2023-07-22 LAB — ECHOCARDIOGRAM COMPLETE
AR max vel: 1.67 cm2
AV Area VTI: 1.54 cm2
AV Area mean vel: 1.57 cm2
AV Mean grad: 3.5 mmHg
AV Peak grad: 6.4 mmHg
Ao pk vel: 1.27 m/s
S' Lateral: 2.6 cm
Weight: 3776 [oz_av]

## 2023-07-22 LAB — GLUCOSE, CAPILLARY
Glucose-Capillary: 186 mg/dL — ABNORMAL HIGH (ref 70–99)
Glucose-Capillary: 230 mg/dL — ABNORMAL HIGH (ref 70–99)

## 2023-07-22 SURGERY — CARDIOVERSION
Anesthesia: General

## 2023-07-22 MED ORDER — POTASSIUM CHLORIDE CRYS ER 20 MEQ PO TBCR
40.0000 meq | EXTENDED_RELEASE_TABLET | Freq: Once | ORAL | Status: AC
  Administered 2023-07-22: 40 meq via ORAL
  Filled 2023-07-22: qty 2

## 2023-07-22 MED ORDER — INSULIN ASPART 100 UNIT/ML IJ SOLN
0.0000 [IU] | Freq: Three times a day (TID) | INTRAMUSCULAR | Status: DC
  Administered 2023-07-22: 3 [IU] via SUBCUTANEOUS
  Filled 2023-07-22: qty 1

## 2023-07-22 MED ORDER — INSULIN ASPART 100 UNIT/ML IJ SOLN: 0.0000 [IU] | Freq: Three times a day (TID) | INTRAMUSCULAR | Status: DC

## 2023-07-22 MED ORDER — LIDOCAINE HCL (CARDIAC) PF 100 MG/5ML IV SOSY
PREFILLED_SYRINGE | INTRAVENOUS | Status: DC | PRN
  Administered 2023-07-22: 100 mg via INTRAVENOUS

## 2023-07-22 MED ORDER — INSULIN ASPART 100 UNIT/ML IJ SOLN: 0.0000 [IU] | Freq: Every day | INTRAMUSCULAR | Status: DC

## 2023-07-22 MED ORDER — PROPOFOL 10 MG/ML IV BOLUS
INTRAVENOUS | Status: DC | PRN
Start: 2023-07-22 — End: 2023-07-22
  Administered 2023-07-22: 50 mg via INTRAVENOUS

## 2023-07-22 NOTE — Progress Notes (Signed)
 AVS gone over with the pt and her daughter. All questions answered. Pt taken to the exit via wheelchair. Pt got into a private vehicle with her daughter.

## 2023-07-22 NOTE — Anesthesia Preprocedure Evaluation (Addendum)
 Anesthesia Evaluation  Patient identified by MRN, date of birth, ID band Patient awake    Reviewed: Allergy & Precautions, NPO status , Patient's Chart, lab work & pertinent test results  History of Anesthesia Complications Negative for: history of anesthetic complications  Airway Mallampati: III   Neck ROM: Full    Dental  (+) Missing   Pulmonary sleep apnea    Pulmonary exam normal breath sounds clear to auscultation       Cardiovascular hypertension, + dysrhythmias Atrial Fibrillation  Rhythm:Irregular Rate:Normal     Neuro/Psych negative neurological ROS     GI/Hepatic negative GI ROS,,,  Endo/Other  diabetes, Type 2  Obesity   Renal/GU negative Renal ROS     Musculoskeletal   Abdominal   Peds  Hematology negative hematology ROS (+)   Anesthesia Other Findings   Reproductive/Obstetrics                             Anesthesia Physical Anesthesia Plan  ASA: 3  Anesthesia Plan: General   Post-op Pain Management:    Induction: Intravenous  PONV Risk Score and Plan: 3 and Propofol  infusion, TIVA and Treatment may vary due to age or medical condition  Airway Management Planned: Natural Airway  Additional Equipment:   Intra-op Plan:   Post-operative Plan:   Informed Consent: I have reviewed the patients History and Physical, chart, labs and discussed the procedure including the risks, benefits and alternatives for the proposed anesthesia with the patient or authorized representative who has indicated his/her understanding and acceptance.       Plan Discussed with: CRNA  Anesthesia Plan Comments: (LMA/GETA backup discussed.  Patient consented for risks of anesthesia including but not limited to:  - adverse reactions to medications - damage to eyes, teeth, lips or other oral mucosa - nerve damage due to positioning  - sore throat or hoarseness - damage to heart, brain,  nerves, lungs, other parts of body or loss of life  Informed patient about role of CRNA in peri- and intra-operative care.  Patient voiced understanding.)       Anesthesia Quick Evaluation

## 2023-07-22 NOTE — CV Procedure (Signed)
 Cardioversion procedure note For atrial fibrillation, persistent.  Procedure Details:  Consent: Risks of procedure as well as the alternatives and risks of each were explained to the (patient/caregiver).  Consent for procedure obtained.  Time Out: Verified patient identification, verified procedure, site/side was marked, verified correct patient position, special equipment/implants available, medications/allergies/relevent history reviewed, required imaging and test results available.  Performed  Patient placed on cardiac monitor, pulse oximetry, supplemental oxygen as necessary.   Sedation given: propofol IV, Dr. Ronni Rumble Pacer pads placed anterior and posterior chest.   Cardioverted 1 time(s).   Cardioverted at  150 J. Synchronized biphasic Converted to NSR   Evaluation: Findings: Post procedure EKG shows: NSR Complications: None Patient did tolerate procedure well.  Time Spent Directly with the Patient:  45 minutes   [name] Strickland, M.D., Ph.D.

## 2023-07-22 NOTE — Discharge Summary (Addendum)
 Physician Discharge Summary  [name] Strickland:[id number] DOB: 1950/09/26 DOA: 07/20/2023  PCP: Judithann Novas, MD  Admit date: 07/20/2023 Discharge date: 07/22/2023  Admitted From: home  Disposition:  home   Recommendations for Outpatient Follow-up:  Follow up with PCP in 1-2 weeks F/u w/ EP, Dr. Daneil Dunker, in 2 weeks   Home Health: no  Equipment/Devices:  Discharge Condition: stable CODE STATUS: full Diet recommendation: Heart Healthy / Carb Modified  Brief/Interim Summary: HPI was taken from Dr. Alva Jewels: [name] Strickland is a 73 y.o. female with a PMH significant for PAF, HTN, obesity, type 2 diabetes without insulin  use, HLD. At baseline, they live at home with her daughter and self-sufficient with their ADLs.   They presented from home to the ED on 07/20/2023 with due to recognizing onset of acute A-fib RVR similar to past episodes.  Patient describes inability to sleep last night and was up until about 3 AM.  She thinks possibly she was worrying about today being a holiday and her birthday because in her family everyone who has passed away has done so on a holiday and she may have been anticipating a bad outcome today.  She otherwise felt in her normal state of health when she went to bed yesterday.  This morning, when she got up to go to the bathroom she felt lightheaded, shortness of breath.  She took her morning medications including antiarrhythmic and [name]  and laid back down which resulted in her heart rate slowing.  Again later in the day she got up to go to the bathroom with similar symptoms and took a as needed diltiazem  dose as instructed by her cardiologist when in acute A-fib episode however, her heart rate did not slow with rest the second time so she instructed her daughter to bring her to the emergency department.  Denies chest pain but has achy feeling in her left shoulder.  At the time of my interview, she has already been started on the diltiazem  drip with heart rate in the 90s  still in atrial fibrillation.  Her symptoms have improved but still feels tired. She states that she is strongly adherent with all of her medications has not missed any doses recently.  Denies any recent illnesses.  Last time she was in rapid rhythm was July of last year and converted spontaneously on diltiazem  drip.  She has multiple cardioversions in her past as well as 1 failed attempt at ablation in her remote past. She follows with Dr. Pete Brand, EP outpatient. She endorses having 3 weeks of lower extremity swelling which she attributes to getting new shoes.   In the ED, it was found that they had heart rate irregular and in the 160s, blood pressure 150/90, oxygen saturations 95% on room air, temperature 98 F.  Heart rate improved to 90s on diltiazem  drip.  Significant findings included: Hemoglobin 16.4, WBC 9.2, platelets 371.  Unremarkable metabolic panel.  K+ 3.7.  Troponin 7.  Magnesium  2.1 No active disease on chest x-ray.   They were initially treated with diltiazem  bolus followed by drip.    Patient was admitted to medicine service for further workup and management of A-fib RVR as outlined in detail below.  Discharge Diagnoses:  Principal Problem:   Atrial fibrillation, rapid (HCC) Active Problems:   Atrial fibrillation with RVR (HCC)   SOB (shortness of breath)  Likely PAF: w/ RVR. Continue on diltiazem , tikosyn  & pradaxa . IV dilt drip was d/c. Hx of ablation w/o any improvement of a.  fib. Continue on tele. S/p cardioversion w/ conversion to sinus rhythm as per cardio. F/u w/ EP, Dr. Daneil Dunker, in 2 weeks    Lower extremity edema: etiology unclear, CHF vs chronic venous insufficiency. Echo show EF 55-50%, diastolic function is indeterminate, no regional wall motion abnormalities, trivial MR. S/p IV lasix  x 1   HLD: continue on statin    DM2: well controlled, HbA1c 6.5. Continue on glipizide , mounjaro  at d/c   Discharge Instructions  Discharge Instructions     Diet - low sodium  heart healthy   Complete by: As directed    Diet Carb Modified   Complete by: As directed    Discharge instructions   Complete by: As directed    F/u w/ EP, Dr. Daneil Dunker, in 2 weeks. F/u w/ PCP in 1-2 weeks   Increase activity slowly   Complete by: As directed       Allergies as of 07/22/2023       Reactions   Shellfish-derived Products Itching, Swelling      Fish Allergy Itching, Swelling   Iodine Other (See Comments)   Unknown childhood reaction to topical iodine   Contrast Media [iodinated Contrast Media] Other (See Comments)   Unknown reaction to topical iodine as a child   Sulfonamide Derivatives Itching, Rash        Medication List     TAKE these medications    acetaminophen  650 MG CR tablet Commonly known as: TYLENOL  Take 650 mg by mouth at bedtime as needed for pain.   ALPRAZolam  0.25 MG tablet Commonly known as: XANAX  Take 1 tablet (0.25 mg total) by mouth 2 (two) times daily as needed for anxiety.   atorvastatin  10 MG tablet Commonly known as: LIPITOR Take 1 tablet (10 mg total) by mouth daily.   cyclobenzaprine  10 MG tablet Commonly known as: FLEXERIL  Take 0.5-1 tablets (5-10 mg total) by mouth at bedtime as needed for muscle spasms.   dabigatran  150 MG Caps capsule Commonly known as: [name]  Take 1 capsule (150 mg total) by mouth 2 (two) times daily.   diltiazem  120 MG 24 hr capsule Commonly known as: Cardizem  CD Take 1 capsule (120 mg total) by mouth daily.   diltiazem  30 MG tablet Commonly known as: CARDIZEM  Take 1 tablet (30 mg total) by mouth every 6 (six) hours as needed (for persistant heart rate greater than 120).   dofetilide  250 MCG capsule Commonly known as: TIKOSYN  Take 1 capsule (250 mcg total) by mouth 2 (two) times daily.   glipiZIDE  5 MG 24 hr tablet Commonly known as: GLUCOTROL  XL Take 2 tablets (10 mg total) by mouth daily.   Mounjaro  2.5 MG/0.5ML Pen Generic drug: tirzepatide  Inject 2.5 mg into the skin once a week.    traMADol  50 MG tablet Commonly known as: ULTRAM  Take 1 tablet (50 mg total) by mouth every 12 (twelve) hours as needed.        Allergies  Allergen Reactions   Shellfish-Derived Products Itching and Swelling        Fish Allergy Itching and Swelling   Iodine Other (See Comments)    Unknown childhood reaction to topical iodine   Contrast Media [Iodinated Contrast Media] Other (See Comments)    Unknown reaction to topical iodine as a child   Sulfonamide Derivatives Itching and Rash    Consultations: Cardio EP   Procedures/Studies: ECHOCARDIOGRAM COMPLETE Result Date: 07/22/2023    ECHOCARDIOGRAM REPORT   Patient Name:   [name] Strickland Date of Exam: 07/21/2023 Medical  Rec #:  [id number]    Height:       68.0 in Accession #:    [id number]   Weight:       236.0 lb Date of Birth:  1950-08-09    BSA:          2.193 m Patient Age:    73 years     BP:           124/94 mmHg Patient Gender: F            HR:           92 bpm. Exam Location:  [facility] Procedure: 2D Echo, Cardiac Doppler and Color Doppler (Both Spectral and Color            Flow Doppler were utilized during procedure). Indications:     I50.31 Acute Diastolic CHF  History:         Patient has prior history of Echocardiogram examinations, most                  recent 07/12/2014. Arrythmias:Atrial Fibrillation and Atrial                  Flutter; Risk Factors:Hypertension, Diabetes and Dyslipidemia.  Sonographer:     Brigid Canada RDCS Referring Phys:  [id number] Ree Candy Diagnosing Phys: Sammy Crisp MD IMPRESSIONS  1. Left ventricular ejection fraction, by estimation, is 55 to 60%. The left ventricle has normal function. The left ventricle has no regional wall motion abnormalities. There is mild left ventricular hypertrophy. Left ventricular diastolic parameters are indeterminate.  2. Right ventricular systolic function is low normal. The right ventricular size is normal.  3. The mitral valve is normal in structure. Trivial  mitral valve regurgitation.  4. The aortic valve is tricuspid. There is moderate calcification of the aortic valve. There is moderate thickening of the aortic valve. Aortic valve regurgitation is not visualized. Aortic valve sclerosis/calcification is present, without any evidence of aortic stenosis.  5. There is mild dilatation of the ascending aorta, measuring 39 mm.  6. The inferior vena cava is normal in size with <50% respiratory variability, suggesting right atrial pressure of 8 mmHg. FINDINGS  Left Ventricle: Left ventricular ejection fraction, by estimation, is 55 to 60%. The left ventricle has normal function. The left ventricle has no regional wall motion abnormalities. The left ventricular internal cavity size was normal in size. There is  mild left ventricular hypertrophy. Left ventricular diastolic parameters are indeterminate. Right Ventricle: The right ventricular size is normal. No increase in right ventricular wall thickness. Right ventricular systolic function is low normal. Left Atrium: Left atrial size was normal in size. Right Atrium: Right atrial size was normal in size. Pericardium: There is no evidence of pericardial effusion. Mitral Valve: The mitral valve is normal in structure. Trivial mitral valve regurgitation. Tricuspid Valve: The tricuspid valve is grossly normal. Tricuspid valve regurgitation is trivial. Aortic Valve: The aortic valve is tricuspid. There is moderate calcification of the aortic valve. There is moderate thickening of the aortic valve. Aortic valve regurgitation is not visualized. Aortic valve sclerosis/calcification is present, without any  evidence of aortic stenosis. Aortic valve mean gradient measures 3.5 mmHg. Aortic valve peak gradient measures 6.4 mmHg. Aortic valve area, by VTI measures 1.54 cm. Pulmonic Valve: The pulmonic valve was grossly normal. Pulmonic valve regurgitation is not visualized. No evidence of pulmonic stenosis. Aorta: The aortic root is normal  in size and structure. There is mild dilatation of the ascending  aorta, measuring 39 mm. Pulmonary Artery: The pulmonary artery is not well seen. Venous: The inferior vena cava is normal in size with less than 50% respiratory variability, suggesting right atrial pressure of 8 mmHg. IAS/Shunts: The interatrial septum was not well visualized.  LEFT VENTRICLE PLAX 2D LVIDd:         4.30 cm LVIDs:         2.60 cm LV PW:         1.20 cm LV IVS:        1.30 cm LVOT diam:     2.00 cm LV SV:         36 LV SV Index:   17 LVOT Area:     3.14 cm  RIGHT VENTRICLE             IVC RV Basal diam:  3.50 cm     IVC diam: 1.60 cm RV S prime:     10.54 cm/s TAPSE (M-mode): 3.1 cm LEFT ATRIUM             Index        RIGHT ATRIUM           Index LA diam:        4.90 cm 2.23 cm/m   RA Area:     18.20 cm LA Vol (A2C):   55.0 ml 25.08 ml/m  RA Volume:   56.00 ml  25.54 ml/m LA Vol (A4C):   52.2 ml 23.81 ml/m LA Biplane Vol: 53.7 ml 24.49 ml/m  AORTIC VALVE AV Area (Vmax):    1.67 cm AV Area (Vmean):   1.57 cm AV Area (VTI):     1.54 cm AV Vmax:           126.50 cm/s AV Vmean:          88.394 cm/s AV VTI:            0.237 m AV Peak Grad:      6.4 mmHg AV Mean Grad:      3.5 mmHg LVOT Vmax:         67.27 cm/s LVOT Vmean:        44.167 cm/s LVOT VTI:          0.116 m LVOT/AV VTI ratio: 0.49  AORTA Ao Root diam: 3.30 cm Ao Asc diam:  3.90 cm MV E velocity: 63.03 cm/s                            SHUNTS                            Systemic VTI:  0.12 m                            Systemic Diam: 2.00 cm Sammy Crisp MD Electronically signed by Sammy Crisp MD Signature Date/Time: 07/22/2023/11:49:37 AM    Final    DG Chest Portable 1 View Result Date: 07/20/2023 CLINICAL DATA:  Chest pain EXAM: PORTABLE CHEST 1 VIEW COMPARISON:  11/11/2022 FINDINGS: The heart size and mediastinal contours are within normal limits. Both lungs are clear. The visualized skeletal structures are unremarkable. IMPRESSION: No active disease.  Electronically Signed   By: Esmeralda Hedge M.D.   On: 07/20/2023 22:32   (Echo, Carotid, EGD, Colonoscopy, ERCP)    Subjective: Pt c/o feeling sleepy    Discharge  Exam: Vitals:   07/22/23 0815 07/22/23 1221  BP: 120/71 (!) 158/68  Pulse: (!) 57 63  Resp: 16 17  Temp: 97.8 F (36.6 C) 98 F (36.7 C)  SpO2: 93% 97%   Vitals:   07/22/23 0750 07/22/23 0800 07/22/23 0815 07/22/23 1221  BP: 116/77 125/73 120/71 (!) 158/68  Pulse: 68 (!) 58 (!) 57 63  Resp: 15 14 16 17   Temp: 97.8 F (36.6 C)  97.8 F (36.6 C) 98 F (36.7 C)  TempSrc: Oral  Oral   SpO2: 95% 93% 93% 97%  Weight:        General: Pt is alert, awake, not in acute distress Cardiovascular: S1/S2 +, no rubs, no gallops Respiratory: CTA bilaterally, no wheezing, no rhonchi Abdominal: Soft, NT,obese, bowel sounds + Extremities: no cyanosis    The results of significant diagnostics from this hospitalization (including imaging, microbiology, ancillary and laboratory) are listed below for reference.     Microbiology: No results found for this or any previous visit (from the past 240 hours).   Labs: BNP (last 3 results) Recent Labs    07/21/23 0529  BNP 269.7*   Basic Metabolic Panel: Recent Labs  Lab 07/20/23 2109 07/21/23 0529 07/22/23 0903  NA 138 139 138  K 3.7 3.7 3.8  CL 104 107 106  CO2 23 22 22   GLUCOSE 140* 144* 182*  BUN 13 12 20   CREATININE 0.71 0.72 0.84  CALCIUM  9.6 9.1 9.0  MG 2.1  --   --    Liver Function Tests: Recent Labs  Lab 07/20/23 2109  AST 20  ALT 14  ALKPHOS 54  BILITOT 0.6  PROT 8.1  ALBUMIN 4.2   No results for input(s): "LIPASE", "AMYLASE" in the last 168 hours. No results for input(s): "AMMONIA" in the last 168 hours. CBC: Recent Labs  Lab 07/20/23 2109 07/21/23 0529  WBC 9.2 7.9  NEUTROABS 4.5  --   HGB 16.4* 15.2*  HCT 49.3* 45.6  MCV 82.9 82.3  PLT 371 303   Cardiac Enzymes: No results for input(s): "CKTOTAL", "CKMB", "CKMBINDEX", "TROPONINI"  in the last 168 hours. BNP: Invalid input(s): "POCBNP" CBG: Recent Labs  Lab 07/21/23 2155 07/22/23 0733 07/22/23 1235  GLUCAP 151* 186* 230*   D-Dimer No results for input(s): "DDIMER" in the last 72 hours. Hgb A1c Recent Labs    07/20/23 2109  HGBA1C 6.5*   Lipid Profile No results for input(s): "CHOL", "HDL", "LDLCALC", "TRIG", "CHOLHDL", "LDLDIRECT" in the last 72 hours. Thyroid function studies Recent Labs    07/20/23 2247  TSH 4.624*   Anemia work up No results for input(s): "VITAMINB12", "FOLATE", "FERRITIN", "TIBC", "IRON", "RETICCTPCT" in the last 72 hours. Urinalysis    Component Value Date/Time   COLORURINE Colorless 07/02/2014 0942   APPEARANCEUR Clear 07/02/2014 0942   LABSPEC 1.005 07/02/2014 0942   PHURINE 5.0 07/02/2014 0942   GLUCOSEU Negative 07/02/2014 0942   HGBUR Negative 07/02/2014 0942   BILIRUBINUR negative 04/29/2016 0943   BILIRUBINUR Negative 07/02/2014 0942   KETONESUR Negative 07/02/2014 0942   PROTEINUR negative 04/29/2016 0943   PROTEINUR Negative 07/02/2014 0942   UROBILINOGEN 0.2 04/29/2016 0943   NITRITE negative 04/29/2016 0943   NITRITE Negative 07/02/2014 0942   LEUKOCYTESUR Negative 04/29/2016 0943   LEUKOCYTESUR Negative 07/02/2014 0942   Sepsis Labs Recent Labs  Lab 07/20/23 2109 07/21/23 0529  WBC 9.2 7.9   Microbiology No results found for this or any previous visit (from the past 240 hours).  Time coordinating discharge: Over 30 minutes  SIGNED:   Alphonsus Jeans, MD  [facility] 07/22/2023, 3:05 PM Pager   If 7PM-7AM, please contact night-coverage [email or web address]

## 2023-07-22 NOTE — Consult Note (Signed)
 Cardiology Consultation   Patient ID: [name] Strickland MRN: [id number]; DOB: [birth year]  Admit date: 07/20/2023 Date of Consult: 07/22/2023  PCP:  Judithann Novas, MD   [facility] Providers Cardiologist:  None  Electrophysiologist:  Manya Sells, MD       History of Present Illness:   [name] Strickland is a 73 y.o. female with a hx of paroxysmal atrial fibrillation s/p PVI and CTI ablation 07/2014 on Tikosyn  and [name] ,  hypertension, hyperlipidemia, and T2DM who presented to the ED on 07/20/2023 with a chief complaint of palpitations.  Her palpitations were accompanied by dyspnea on exertion, fatigue and lightheadedness.  The symptoms are consistent with her prior episodes of atrial fibrillation.  She had been compliant with all of her medications, including her Tikosyn  and [name] .  She successful underwent cardioversion earlier this morning.  Post cardioversion, she reports feeling improved with no new or acute complaints.   Past Medical History:  Diagnosis Date   Diabetes mellitus    pt is unaware and is on no medicine for this (A1C 5.9 2012)   Dyslipidemia    Hypertension    Overweight    Paroxysmal atrial fibrillation (HCC)    chads2vasc score of at least 2   Snoring    cancelled prior sleep study due to costs   Typical atrial flutter [facility])     Past Surgical History:  Procedure Laterality Date   APPENDECTOMY     ATRIAL FIBRILLATION ABLATION N/A 07/26/2014   Procedure: ATRIAL FIBRILLATION ABLATION;  Surgeon: Jolly Needle, MD;  Location: [facility] CATH LAB;  Service: Cardiovascular;  Laterality: N/A;   BREAST LUMPECTOMY     benign   CARDIOVERSION N/A 07/03/2014   Procedure: CARDIOVERSION;  Surgeon: Deena Farrier, MD;  Location: [facility] OR;  Service: Cardiovascular;  Laterality: N/A;   KNEE SURGERY     REPLACEMENT TOTAL KNEE     bilateral, different times   TEE WITHOUT CARDIOVERSION N/A 07/26/2014   Procedure: TRANSESOPHAGEAL ECHOCARDIOGRAM (TEE);  Surgeon: Lenise Quince, MD;   Location: [facility] ENDOSCOPY;  Service: Cardiovascular;  Laterality: N/A;   TONSILLECTOMY AND ADENOIDECTOMY       Inpatient Medications: Scheduled Meds:  [MAR Hold] atorvastatin   10 mg Oral Daily   [MAR Hold] dabigatran   150 mg Oral BID   [MAR Hold] dofetilide   250 mcg Oral BID   Continuous Infusions:  sodium chloride  100 mL/hr at 07/22/23 0739   diltiazem  (CARDIZEM ) infusion 5 mg/hr (07/21/23 2106)   PRN Meds: [WUJ Hold] acetaminophen  **OR** [MAR Hold] acetaminophen , [MAR Hold] albuterol , [MAR Hold] ALPRAZolam , [MAR Hold] bisacodyl , [MAR Hold] cyclobenzaprine , [MAR Hold] ondansetron  **OR** [MAR Hold] ondansetron  (ZOFRAN ) IV, [MAR Hold] mouth rinse  Allergies:    Allergies  Allergen Reactions   Shellfish-Derived Products Itching and Swelling        Fish Allergy Itching and Swelling   Iodine Other (See Comments)    Unknown childhood reaction to topical iodine   Contrast Media [Iodinated Contrast Media] Other (See Comments)    Unknown reaction to topical iodine as a child   Sulfonamide Derivatives Itching and Rash    Social History:   Social History   Socioeconomic History   Marital status: Widowed    Spouse name: Not on file   Number of children: Not on file   Years of education: Not on file   Highest education level: Associate degree: occupational, Scientist, product/process development, or vocational program  Occupational History   Not on file  Tobacco Use   Smoking  status: Never   Smokeless tobacco: Never  Vaping Use   Vaping status: Never Used  Substance and Sexual Activity   Alcohol use: No   Drug use: No   Sexual activity: Not on file  Other Topics Concern   Not on file  Social History Narrative   Pt lives in [place] with husband daughter.  Her husband is chronically ill with CHF and morbid obesity (420 lbs). She provides total care for him.   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   Social Drivers of Corporate investment banker Strain: Low Risk  (02/21/2023)   Overall  Financial Resource Strain (CARDIA)    Difficulty of Paying Living Expenses: Not very hard  Food Insecurity: No Food Insecurity (07/21/2023)   Hunger Vital Sign    Worried About Running Out of Food in the Last Year: Never true    Ran Out of Food in the Last Year: Never true  Transportation Needs: No Transportation Needs (07/21/2023)   PRAPARE - Administrator, Civil Service (Medical): No    Lack of Transportation (Non-Medical): No  Physical Activity: Insufficiently Active (02/21/2023)   Exercise Vital Sign    Days of Exercise per Week: 3 days    Minutes of Exercise per Session: 20 min  Stress: No Stress Concern Present (02/21/2023)   [employer] of Occupational Health - Occupational Stress Questionnaire    Feeling of Stress : Not at all  Social Connections: Moderately Isolated (07/21/2023)   Social Connection and Isolation Panel [NHANES]    Frequency of Communication with Friends and Family: More than three times a week    Frequency of Social Gatherings with Friends and Family: More than three times a week    Attends Religious Services: More than 4 times per year    Active Member of [employer] or Organizations: No    Attends Banker Meetings: Never    Marital Status: Widowed  Intimate Partner Violence: Not At Risk (07/21/2023)   Humiliation, Afraid, Rape, and Kick questionnaire    Fear of Current or Ex-Partner: No    Emotionally Abused: No    Physically Abused: No    Sexually Abused: No    Family History:    Family History  Problem Relation Age of Onset   Heart attack Mother    Heart disease Father        cabg x 7   Heart attack Father    Stroke Father    Breast cancer Neg Hx      ROS:  Please see the history of present illness.   All other ROS reviewed and negative.     Physical Exam/Data:   Vitals:   07/22/23 0551 07/22/23 0727 07/22/23 0750 07/22/23 0800  BP: 130/79 (!) 154/68 116/77 125/73  Pulse: 77  68 (!) 58  Resp: 14 15 15 14   Temp:   97.9 F (36.6 C)    TempSrc:  Oral    SpO2:  94% 95% 93%  Weight:        Intake/Output Summary (Last 24 hours) at 07/22/2023 0815 Last data filed at 07/21/2023 2106 Gross per 24 hour  Intake 600 ml  Output --  Net 600 ml      07/22/2023    5:00 AM 07/21/2023    1:01 AM 04/09/2023    3:36 PM  Last 3 Weights  Weight (lbs) 236 lb 15.9 oz 236 lb 234 lb 3.2 oz  Weight (kg) 107.5 kg 107.049  kg 106.232 kg     Body mass index is 36.03 kg/m.   General: Well developed, in no acute distress.  Neck: No JVD.  Cardiac: Normal rate, regular rhythm.  Resp: Normal work of breathing.  Ext: No edema.  Neuro: No gross focal deficits.  Psych: Normal affect.   EKG:  The EKG was personally reviewed and demonstrates:  Telemetry:  Telemetry was personally reviewed and demonstrates:  Sinus bradycardia since returning from cardioversion.  Relevant CV Studies: Echo 07/22/23:   1. Left ventricular ejection fraction, by estimation, is 55 to 60%. The  left ventricle has normal function. The left ventricle has no regional  wall motion abnormalities. There is mild left ventricular hypertrophy.  Left ventricular diastolic parameters  are indeterminate.   2. Right ventricular systolic function is low normal. The right  ventricular size is normal.   3. The mitral valve is normal in structure. Trivial mitral valve  regurgitation.   4. The aortic valve is tricuspid. There is moderate calcification of the  aortic valve. There is moderate thickening of the aortic valve. Aortic  valve regurgitation is not visualized. Aortic valve  sclerosis/calcification is present, without any evidence  of aortic stenosis.   5. There is mild dilatation of the ascending aorta, measuring 39 mm.   6. The inferior vena cava is normal in size with <50% respiratory  variability, suggesting right atrial pressure of 8 mmHg.    Laboratory Data:  High Sensitivity Troponin:   Recent Labs  Lab 07/20/23 2109 07/20/23 2247   TROPONINIHS 7 6     Chemistry Recent Labs  Lab 07/20/23 2109 07/21/23 0529  NA 138 139  K 3.7 3.7  CL 104 107  CO2 23 22  GLUCOSE 140* 144*  BUN 13 12  CREATININE 0.71 0.72  CALCIUM  9.6 9.1  MG 2.1  --   GFRNONAA >60 >60  ANIONGAP 11 10    Recent Labs  Lab 07/20/23 2109  PROT 8.1  ALBUMIN 4.2  AST 20  ALT 14  ALKPHOS 54  BILITOT 0.6   Lipids No results for input(s): "CHOL", "TRIG", "HDL", "LABVLDL", "LDLCALC", "CHOLHDL" in the last 168 hours.  Hematology Recent Labs  Lab 07/20/23 2109 07/21/23 0529  WBC 9.2 7.9  RBC 5.95* 5.54*  HGB 16.4* 15.2*  HCT 49.3* 45.6  MCV 82.9 82.3  MCH 27.6 27.4  MCHC 33.3 33.3  RDW 13.6 13.6  PLT 371 303   Thyroid  Recent Labs  Lab 07/20/23 2247  TSH 4.624*    BNP Recent Labs  Lab 07/21/23 0529  BNP 269.7*    DDimer No results for input(s): "DDIMER" in the last 168 hours.   Radiology/Studies:  DG Chest Portable 1 View Result Date: 07/20/2023 CLINICAL DATA:  Chest pain EXAM: PORTABLE CHEST 1 VIEW COMPARISON:  11/11/2022 FINDINGS: The heart size and mediastinal contours are within normal limits. Both lungs are clear. The visualized skeletal structures are unremarkable. IMPRESSION: No active disease. Electronically Signed   By: Esmeralda Hedge M.D.   On: 07/20/2023 22:32     Assessment and Plan:   #. Persistent atrial fibrillation, symptomatic: S/p cardioversoin 07/22/23.  #. High risk medication use: Stopped IV diltiazem . Repeated ECG with heart rates improved to 60s and Qtc at .  -We discussed that given recurrence of atrial fibrillation on Tikosyn  her options were to continue Tikosyn  and monitor for recurrence, stop Tikosyn  and transition to amiodarone which may be more effective but comes at cost of side effects, or consider  redo catheter ablation.  Patient is leaning towards the idea of catheter ablation.  I have scheduled her to see me in clinic in 2 weeks to finalize a plan. -Continue Tikosyn  250mcg BID for  now.  -Continue home diltiazem  120mg  daily for now.   #. Secondary hypercoagulable state due to atrial fibrillation:  -Continue [name]  150mg  twice daily.   For questions or updates, please contact [facility] Please consult [email or web address] for contact info under    Signed, Ardeen Kohler, MD  07/22/2023 8:15 AM

## 2023-07-22 NOTE — Inpatient Diabetes Management (Signed)
 Inpatient Diabetes Program Recommendations  AACE/ADA: New Consensus Statement on Inpatient Glycemic Control   Target Ranges:  Prepandial:   less than 140 mg/dL      Peak postprandial:   less than 180 mg/dL (1-2 hours)      Critically ill patients:  140 - 180 mg/dL    Latest Reference Range & Units 07/21/23 21:55 07/22/23 07:33  Glucose-Capillary 70 - 99 mg/dL 161 (H) 096 (H)    Latest Reference Range & Units 07/20/23 21:09 07/21/23 05:29 07/22/23 09:03  Glucose 70 - 99 mg/dL 045 (H) 409 (H) 811 (H)  Hemoglobin A1C 4.8 - 5.6 % 6.5 (H)     Review of Glycemic Control  Diabetes history: DM2 Outpatient Diabetes medications: Glipizide  10 mg daily, Mounjaro  2.5 mg Qweek Current orders for Inpatient glycemic control: None  Inpatient Diabetes Program Recommendations:    Insulin : CBG 186 mg/dl this morning.  Please consider ordering CBGs AC&HS and Novolog  0-9 units TID with meals and Novolog  0-5 units at bedtime.  Thanks, Beacher Limerick, RN, MSN, CDCES Diabetes Coordinator Inpatient Diabetes Program [phone] (Team Pager from 8am to 5pm)

## 2023-07-22 NOTE — Anesthesia Postprocedure Evaluation (Signed)
 Anesthesia Post Note  Patient: [name] Strickland  Procedure(s) Performed: CARDIOVERSION  Patient location during evaluation: PACU Anesthesia Type: General Level of consciousness: awake and alert, oriented and patient cooperative Pain management: pain level controlled Vital Signs Assessment: post-procedure vital signs reviewed and stable Respiratory status: spontaneous breathing, nonlabored ventilation and respiratory function stable Cardiovascular status: blood pressure returned to baseline and stable Postop Assessment: adequate PO intake Anesthetic complications: no   There were no known notable events for this encounter.   Last Vitals:  Vitals:   07/22/23 0800 07/22/23 0815  BP: 125/73 120/71  Pulse: (!) 58 (!) 57  Resp: 14 16  Temp:  36.6 C  SpO2: 93% 93%    Last Pain:  Vitals:   07/22/23 0815  TempSrc: Oral  PainSc: 0-No pain                 Dorothey Gate

## 2023-07-22 NOTE — Transfer of Care (Signed)
 Immediate Anesthesia Transfer of Care Note  Patient: [name] Strickland  Procedure(s) Performed: CARDIOVERSION  Patient Location: [facility]  Anesthesia Type:General  Level of Consciousness: drowsy and patient cooperative  Airway & Oxygen Therapy: Patient connected to nasal cannula oxygen  Post-op Assessment: Report given to RN and Post -op Vital signs reviewed and stable  Post vital signs: stable  Last Vitals:  Vitals Value Taken Time  BP    Temp    Pulse 89 07/22/23 0737  Resp 14 07/22/23 0737  SpO2 94 % 07/22/23 0737  Vitals shown include unfiled device data.  Last Pain:  Vitals:   07/22/23 0727  TempSrc: Oral  PainSc: 0-No pain         Complications: No notable events documented.

## 2023-07-23 ENCOUNTER — Encounter: Payer: Self-pay | Admitting: Cardiovascular Disease

## 2023-07-30 ENCOUNTER — Encounter: Payer: Self-pay | Admitting: Internal Medicine

## 2023-07-30 ENCOUNTER — Ambulatory Visit (INDEPENDENT_AMBULATORY_CARE_PROVIDER_SITE_OTHER): Admitting: Internal Medicine

## 2023-07-30 VITALS — BP 160/78 | HR 66 | Temp 98.0°F | Ht 68.0 in | Wt 244.0 lb

## 2023-07-30 DIAGNOSIS — R42 Dizziness and giddiness: Secondary | ICD-10-CM | POA: Insufficient documentation

## 2023-07-30 NOTE — Assessment & Plan Note (Addendum)
 Still regular rhythm so not back in atrial fib Nothing to suggest cardiac ischemia Symptoms more consistent with vestibular etiology Discussed options---limited due to the tikosyn  Discussed usual self limited nature

## 2023-07-30 NOTE — Progress Notes (Signed)
 Subjective:    Patient ID: [name] Strickland, female    DOB: March 26, 1951, 73 y.o.   MRN: [id number]  HPI Here due to nausea and lightheadedness  Had cardioversion on 4/22---was hospitalized for rapid atrial fib 4/20 Started on diltiazem  drip but didn't work Tikosyn  for most of a year No other new medications  Sent home 4/22 Had felt okay Came back to work 4/25 Woke with room spinning dizziness and nausea yesterday morning Felt better last night This morning not great---but came in to work Then had feeling of hot flash and uneasy feeling (but not spinning now) No chest pain or palpitations Has to work to focus her eyes--"like a haze"  No prior vertigo  Current Outpatient Medications on File Prior to Visit  Medication Sig Dispense Refill   acetaminophen  (TYLENOL ) 650 MG CR tablet Take 650 mg by mouth at bedtime as needed for pain.     ALPRAZolam  (XANAX ) 0.25 MG tablet Take 1 tablet (0.25 mg total) by mouth 2 (two) times daily as needed for anxiety. 30 tablet 5   atorvastatin  (LIPITOR) 10 MG tablet Take 1 tablet (10 mg total) by mouth daily. 90 tablet 3   cyclobenzaprine  (FLEXERIL ) 10 MG tablet Take 0.5-1 tablets (5-10 mg total) by mouth at bedtime as needed for muscle spasms. 30 tablet 0   dabigatran  ([name] ) 150 MG CAPS capsule Take 1 capsule (150 mg total) by mouth 2 (two) times daily. 180 capsule 3   diltiazem  (CARDIZEM  CD) 120 MG 24 hr capsule Take 1 capsule (120 mg total) by mouth daily. 30 capsule 6   diltiazem  (CARDIZEM ) 30 MG tablet Take 1 tablet (30 mg total) by mouth every 6 (six) hours as needed (for persistant heart rate greater than 120). 120 tablet 1   dofetilide  (TIKOSYN ) 250 MCG capsule Take 1 capsule (250 mcg total) by mouth 2 (two) times daily. 180 capsule 2   glipiZIDE  (GLUCOTROL  XL) 5 MG 24 hr tablet Take 2 tablets (10 mg total) by mouth daily. 90 tablet 3   tirzepatide  (MOUNJARO ) 2.5 MG/0.5ML Pen Inject 2.5 mg into the skin once a week. 2 mL 11   traMADol  (ULTRAM )  50 MG tablet Take 1 tablet (50 mg total) by mouth every 12 (twelve) hours as needed. 30 tablet 0   No current facility-administered medications on file prior to visit.    Allergies  Allergen Reactions   Shellfish-Derived Products Itching and Swelling        Fish Allergy Itching and Swelling   Iodine Other (See Comments)    Unknown childhood reaction to topical iodine   Contrast Media [Iodinated Contrast Media] Other (See Comments)    Unknown reaction to topical iodine as a child   Sulfonamide Derivatives Itching and Rash    Past Medical History:  Diagnosis Date   Diabetes mellitus    pt is unaware and is on no medicine for this (A1C 5.9 2012)   Dyslipidemia    Hypertension    Overweight    Paroxysmal atrial fibrillation (HCC)    chads2vasc score of at least 2   Snoring    cancelled prior sleep study due to costs   Typical atrial flutter (HCC)     Past Surgical History:  Procedure Laterality Date   APPENDECTOMY     ATRIAL FIBRILLATION ABLATION N/A 07/26/2014   Procedure: ATRIAL FIBRILLATION ABLATION;  Surgeon: Jolly Needle, MD;  Location: [facility] CATH LAB;  Service: Cardiovascular;  Laterality: N/A;   BREAST LUMPECTOMY     benign  CARDIOVERSION N/A 07/03/2014   Procedure: CARDIOVERSION;  Surgeon: Deena Farrier, MD;  Location: [facility] OR;  Service: Cardiovascular;  Laterality: N/A;   CARDIOVERSION N/A 07/22/2023   Procedure: CARDIOVERSION;  Surgeon: Devorah Fonder, MD;  Location: [facility] ORS;  Service: Cardiovascular;  Laterality: N/A;   KNEE SURGERY     REPLACEMENT TOTAL KNEE     bilateral, different times   TEE WITHOUT CARDIOVERSION N/A 07/26/2014   Procedure: TRANSESOPHAGEAL ECHOCARDIOGRAM (TEE);  Surgeon: Lenise Quince, MD;  Location: [facility] ENDOSCOPY;  Service: Cardiovascular;  Laterality: N/A;   TONSILLECTOMY AND ADENOIDECTOMY      Family History  Problem Relation Age of Onset   Heart attack Mother    Heart disease Father        cabg x 7   Heart attack Father    Stroke  Father    Breast cancer Neg Hx     Social History   Socioeconomic History   Marital status: Widowed    Spouse name: Not on file   Number of children: Not on file   Years of education: Not on file   Highest education level: Associate degree: occupational, Scientist, product/process development, or vocational program  Occupational History   Not on file  Tobacco Use   Smoking status: Never   Smokeless tobacco: Never  Vaping Use   Vaping status: Never Used  Substance and Sexual Activity   Alcohol use: No   Drug use: No   Sexual activity: Not on file  Other Topics Concern   Not on file  Social History Narrative   Pt lives in [place] with husband daughter.  Her husband is chronically ill with CHF and morbid obesity (420 lbs). She provides total care for him.   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   Social Drivers of Corporate investment banker Strain: Low Risk  (02/21/2023)   Overall Financial Resource Strain (CARDIA)    Difficulty of Paying Living Expenses: Not very hard  Food Insecurity: No Food Insecurity (07/21/2023)   Hunger Vital Sign    Worried About Running Out of Food in the Last Year: Never true    Ran Out of Food in the Last Year: Never true  Transportation Needs: No Transportation Needs (07/21/2023)   PRAPARE - Administrator, Civil Service (Medical): No    Lack of Transportation (Non-Medical): No  Physical Activity: Insufficiently Active (02/21/2023)   Exercise Vital Sign    Days of Exercise per Week: 3 days    Minutes of Exercise per Session: 20 min  Stress: No Stress Concern Present (02/21/2023)   [employer] of Occupational Health - Occupational Stress Questionnaire    Feeling of Stress : Not at all  Social Connections: Moderately Isolated (07/21/2023)   Social Connection and Isolation Panel [NHANES]    Frequency of Communication with Friends and Family: More than three times a week    Frequency of Social Gatherings with Friends and Family: More than three  times a week    Attends Religious Services: More than 4 times per year    Active Member of [employer] or Organizations: No    Attends Banker Meetings: Never    Marital Status: Widowed  Intimate Partner Violence: Not At Risk (07/21/2023)   Humiliation, Afraid, Rape, and Kick questionnaire    Fear of Current or Ex-Partner: No    Emotionally Abused: No    Physically Abused: No    Sexually Abused: No   Review of  Systems Eating okay No salt Weight is not significantly changed on scale here    Objective:   Physical Exam Constitutional:      Appearance: Normal appearance.  Eyes:     Extraocular Movements: Extraocular movements intact.     Comments: No nystagmus  Cardiovascular:     Rate and Rhythm: Normal rate and regular rhythm.     Heart sounds: No murmur heard.    No gallop.  Pulmonary:     Effort: Pulmonary effort is normal.     Breath sounds: Normal breath sounds. No wheezing or rales.  Abdominal:     Palpations: Abdomen is soft.     Tenderness: There is no abdominal tenderness.  Musculoskeletal:     Cervical back: Neck supple.     Right lower leg: No edema.     Left lower leg: No edema.  Lymphadenopathy:     Cervical: No cervical adenopathy.  Neurological:     Mental Status: She is alert.            Assessment & Plan:

## 2023-08-01 ENCOUNTER — Other Ambulatory Visit (HOSPITAL_COMMUNITY): Payer: Self-pay | Admitting: Internal Medicine

## 2023-08-01 ENCOUNTER — Other Ambulatory Visit: Payer: Self-pay

## 2023-08-01 MED FILL — Diltiazem HCl Coated Beads Cap ER 24HR 120 MG: ORAL | 30 days supply | Qty: 30 | Fill #0 | Status: AC

## 2023-08-04 ENCOUNTER — Other Ambulatory Visit: Payer: Self-pay

## 2023-08-04 NOTE — Progress Notes (Signed)
 " Electrophysiology Office Note:   Date:  08/05/2023  ID:  [name], Strickland Jul 12, 1950, MRN [id number]  Primary Cardiologist: None Electrophysiologist: Danelle Birmingham, MD      History of Present Illness:   [name] Strickland is a 73 y.o. female with h/o paroxysmal atrial fibrillation s/p PVI and CTI ablation 07/2014 on Tikosyn  and [name] ,  hypertension, hyperlipidemia, and T2DM who is being seen today for post hospital discharge follow up evaluation.  Discussed the use of AI scribe software for clinical note transcription with the patient, who gave verbal consent to proceed.  History of Present Illness She presented to the ED on 07/20/2023 with a chief complaint of palpitations.  Her palpitations were accompanied by dyspnea on exertion, fatigue and lightheadedness.  She was found to be in atrial fibrillation.  She underwent successful cardioversion on 07/22/2023. Since her discharge, she has remained in normal sinus rhythm without any episodes of arrhythmia. She is currently on Tikosyn , which she started in July 2024 after flecainide  became ineffective, and she has been tolerating it well. She underwent an ablation in 2016 and is considering another ablation due to the potential for future episodes of atrial fibrillation. After her previous ablation, she experienced significant fatigue and was unable to work for weeks. She manages her condition by taking an extra 30 mg of diltiazem  and resting when she experiences symptoms, which has been effective in resolving episodes in the past. She denies chest pain, shortness of breath, palpitations, lower extremity edema.    Review of systems complete and found to be negative unless listed in HPI.   EP Information / Studies Reviewed:    EKG is ordered today. Personal review as below.  EKG Interpretation Date/Time:  Tuesday Aug 05 2023 10:08:06 EDT Ventricular Rate:  62 PR Interval:    QRS Duration:  64 QT Interval:  412 QTC Calculation: 418 R  Axis:   13  Text Interpretation: Sinus rhythm Low voltage QRS Septal infarct , age undetermined When compared with ECG of 22-Jul-2023 14:41, QT has shortened Confirmed by Kennyth Chew [id number]) on 08/05/2023 10:17:01 AM   Echo 07/21/23:   1. Left ventricular ejection fraction, by estimation, is 55 to 60%. The  left ventricle has normal function. The left ventricle has no regional  wall motion abnormalities. There is mild left ventricular hypertrophy.  Left ventricular diastolic parameters  are indeterminate.   2. Right ventricular systolic function is low normal. The right  ventricular size is normal.   3. The mitral valve is normal in structure. Trivial mitral valve  regurgitation.   4. The aortic valve is tricuspid. There is moderate calcification of the  aortic valve. There is moderate thickening of the aortic valve. Aortic  valve regurgitation is not visualized. Aortic valve  sclerosis/calcification is present, without any evidence  of aortic stenosis.   5. There is mild dilatation of the ascending aorta, measuring 39 mm.   6. The inferior vena cava is normal in size with <50% respiratory  variability, suggesting right atrial pressure of 8 mmHg.    Risk Assessment/Calculations:    CHA2DS2-VASc Score = 4   This indicates a 4.8% annual risk of stroke. The patient's score is based upon: CHF History: 0 HTN History: 1 Diabetes History: 1 Stroke History: 0 Vascular Disease History: 0 Age Score: 1 Gender Score: 1             Physical Exam:   VS:  BP 120/78   Pulse 62   Ht 5'  8 (1.727 m)   Wt 245 lb 3.2 oz (111.2 kg)   SpO2 98%   BMI 37.28 kg/m    Wt Readings from Last 3 Encounters:  08/05/23 245 lb 3.2 oz (111.2 kg)  07/30/23 244 lb (110.7 kg)  07/22/23 236 lb 15.9 oz (107.5 kg)     GEN: Well nourished, well developed in no acute distress NECK: No JVD CARDIAC: Normal rate, regular rhythm RESPIRATORY:  Clear to auscultation without rales, wheezing or rhonchi   ABDOMEN: Soft, non-distended EXTREMITIES:  No edema; No deformity   ASSESSMENT AND PLAN:    #. Persistent atrial fibrillation, symptomatic: S/p ablation in 2016. Recurrence despite Tikosyn . S/p cardioversoin 07/22/23.  #. High risk medication use: Tikosyn . Qtc today. Cr 0.84 on 07/22/23. -Discussed treatment options today for AF including antiarrhythmic drug therapy and ablation. Discussed risks, recovery and likelihood of success with each treatment strategy. Risk, benefits, and alternatives to EP study and ablation for afib were discussed. These risks include but are not limited to stroke, bleeding, vascular damage, tamponade, perforation, damage to the esophagus, lungs, phrenic nerve and other structures, pulmonary vein stenosis, worsening renal function, coronary vasospasm and death.  Discussed potential need for repeat ablation procedures and antiarrhythmic drugs after an initial ablation. The patient understands these risk and wishes to proceed.  We will therefore proceed with catheter ablation at the next available time.  Carto, ICE, anesthesia are requested for the procedure.  Will also obtain CT PV protocol prior to the procedure to exclude LAA thrombus and further evaluate atrial anatomy. -Continue Tikosyn  250mcg BID for now.  -Continue home diltiazem  120mg  daily for now.    #. Secondary hypercoagulable state due to atrial fibrillation: CHADSVASC score of 4. -Continue [name]  150mg  twice daily.   #Hypertension -At goal today.  Recommend checking blood pressures 1-2 times per week at home and recording the values.  Recommend bringing these recordings to the primary care physician.  Follow up with Dr. Kennyth  3 months after ablation.   Signed, Fonda Kennyth, MD  "

## 2023-08-05 ENCOUNTER — Ambulatory Visit: Attending: Cardiology | Admitting: Cardiology

## 2023-08-05 ENCOUNTER — Ambulatory Visit: Payer: Commercial Managed Care - PPO | Admitting: Physician Assistant

## 2023-08-05 ENCOUNTER — Other Ambulatory Visit: Payer: Self-pay

## 2023-08-05 ENCOUNTER — Encounter: Payer: Self-pay | Admitting: Cardiology

## 2023-08-05 VITALS — BP 120/78 | HR 62 | Ht 68.0 in | Wt 245.2 lb

## 2023-08-05 DIAGNOSIS — I480 Paroxysmal atrial fibrillation: Secondary | ICD-10-CM

## 2023-08-05 DIAGNOSIS — Z79899 Other long term (current) drug therapy: Secondary | ICD-10-CM

## 2023-08-05 DIAGNOSIS — I10 Essential (primary) hypertension: Secondary | ICD-10-CM

## 2023-08-05 DIAGNOSIS — I4819 Other persistent atrial fibrillation: Secondary | ICD-10-CM | POA: Diagnosis not present

## 2023-08-05 DIAGNOSIS — D6869 Other thrombophilia: Secondary | ICD-10-CM

## 2023-08-05 NOTE — Patient Instructions (Addendum)
 Medication Instructions:  Your physician recommends that you continue on your current medications as directed. Please refer to the Current Medication list given to you today.  *If you need a refill on your cardiac medications before your next appointment, please call your pharmacy*  Lab Work: BMET and CBC - you may go to any [facility] location to have these drawn within 30 days of your procedure  Testing/Procedures: Cardiac CT Your physician has requested that you have cardiac CT. Cardiac computed tomography (CT) is a painless test that uses an x-ray machine to take clear, detailed pictures of your heart. For further information please visit [email or web address]  We will call you to schedule your CT scan. It will be done about three weeks prior to your ablation.  Ablation Your physician has recommended that you have an ablation. Catheter ablation is a medical procedure used to treat some cardiac arrhythmias (irregular heartbeats). During catheter ablation, a long, thin, flexible tube is put into a blood vessel in your groin (upper thigh), or neck. This tube is called an ablation catheter. It is then guided to your heart through the blood vessel. Radio frequency waves destroy small areas of heart tissue where abnormal heartbeats may cause an arrhythmia to start.   Available dates: 7/7, 7/8, 7/9, 7/21, 7/24, 7/30, 7/31 or 8/4, 8/8, 8/14, 8/15, 8/18, 8/22 Please call us  when you decide on a day to schedule at [phone] or you may send a MyChart message  Follow-Up: At [facility], you and your health needs are our priority.  As part of our continuing mission to provide you with exceptional heart care, we have created designated Provider Care Teams.  These Care Teams include your primary Cardiologist (physician) and Advanced Practice Providers (APPs -  Physician Assistants and Nurse Practitioners) who all work together to provide you with the care you need, when you need it.   Your next  appointment:   We will contact you about your post-procedure follow up appointments.

## 2023-08-06 ENCOUNTER — Ambulatory Visit: Admitting: Physician Assistant

## 2023-08-20 ENCOUNTER — Telehealth: Payer: Self-pay | Admitting: Cardiology

## 2023-08-20 NOTE — Telephone Encounter (Signed)
 Error

## 2023-08-21 ENCOUNTER — Other Ambulatory Visit: Payer: Self-pay

## 2023-08-21 ENCOUNTER — Other Ambulatory Visit: Payer: Self-pay | Admitting: Family Medicine

## 2023-08-21 ENCOUNTER — Other Ambulatory Visit (HOSPITAL_COMMUNITY): Payer: Self-pay | Admitting: Physician Assistant

## 2023-08-22 ENCOUNTER — Other Ambulatory Visit: Payer: Self-pay

## 2023-08-26 ENCOUNTER — Other Ambulatory Visit: Payer: Self-pay

## 2023-08-26 MED FILL — Dofetilide Cap 250 MCG (0.25 MG): ORAL | 30 days supply | Qty: 60 | Fill #0 | Status: AC

## 2023-08-26 MED FILL — Dofetilide Cap 250 MCG (0.25 MG): ORAL | 60 days supply | Qty: 120 | Fill #0 | Status: AC

## 2023-08-27 ENCOUNTER — Other Ambulatory Visit: Payer: Self-pay

## 2023-08-29 ENCOUNTER — Other Ambulatory Visit: Payer: Self-pay

## 2023-08-29 ENCOUNTER — Telehealth: Payer: Self-pay

## 2023-08-29 DIAGNOSIS — I480 Paroxysmal atrial fibrillation: Secondary | ICD-10-CM

## 2023-08-29 MED ORDER — PREDNISONE 50 MG PO TABS
ORAL_TABLET | ORAL | 0 refills | Status: DC
Start: 2023-08-29 — End: 2023-11-11
  Filled 2023-08-29: qty 3, 1d supply, fill #0

## 2023-08-29 NOTE — Telephone Encounter (Signed)
 Called pt twice but I don't think she could hear me because she hung up both times.   I have sent her a MyChart message and sent in Rx for pre-medication for CT Scan due to contrast allergy.   Pt returned my call and she is aware of Rx being sent in and Imaging calling her to schedule her CT. She will have labs done prior to her CT but knows it needs to be within the 30 day window.

## 2023-09-01 ENCOUNTER — Other Ambulatory Visit: Payer: Self-pay

## 2023-09-01 MED FILL — Diltiazem HCl Coated Beads Cap ER 24HR 120 MG: ORAL | 30 days supply | Qty: 30 | Fill #1 | Status: AC

## 2023-09-02 ENCOUNTER — Other Ambulatory Visit: Payer: Self-pay

## 2023-09-03 ENCOUNTER — Other Ambulatory Visit: Payer: Self-pay

## 2023-09-25 ENCOUNTER — Ambulatory Visit: Admitting: Family Medicine

## 2023-09-29 ENCOUNTER — Other Ambulatory Visit: Payer: Self-pay

## 2023-09-29 MED FILL — Diltiazem HCl Coated Beads Cap ER 24HR 120 MG: ORAL | 30 days supply | Qty: 30 | Fill #2 | Status: AC

## 2023-10-10 ENCOUNTER — Ambulatory Visit: Admitting: Family Medicine

## 2023-10-14 ENCOUNTER — Other Ambulatory Visit: Payer: Self-pay

## 2023-10-14 ENCOUNTER — Other Ambulatory Visit: Payer: Self-pay | Admitting: Family Medicine

## 2023-10-14 MED FILL — Glipizide Tab ER 24HR 5 MG: ORAL | 90 days supply | Qty: 180 | Fill #0 | Status: AC

## 2023-10-15 ENCOUNTER — Other Ambulatory Visit

## 2023-10-15 ENCOUNTER — Ambulatory Visit: Admitting: Family Medicine

## 2023-10-15 VITALS — BP 124/78 | HR 53 | Temp 98.4°F | Ht 68.0 in | Wt 252.6 lb

## 2023-10-15 DIAGNOSIS — Z7984 Long term (current) use of oral hypoglycemic drugs: Secondary | ICD-10-CM | POA: Diagnosis not present

## 2023-10-15 DIAGNOSIS — I480 Paroxysmal atrial fibrillation: Secondary | ICD-10-CM

## 2023-10-15 DIAGNOSIS — E1165 Type 2 diabetes mellitus with hyperglycemia: Secondary | ICD-10-CM

## 2023-10-15 DIAGNOSIS — K5903 Drug induced constipation: Secondary | ICD-10-CM | POA: Insufficient documentation

## 2023-10-15 DIAGNOSIS — I10 Essential (primary) hypertension: Secondary | ICD-10-CM | POA: Diagnosis not present

## 2023-10-15 DIAGNOSIS — K649 Unspecified hemorrhoids: Secondary | ICD-10-CM | POA: Insufficient documentation

## 2023-10-15 LAB — CBC WITH DIFFERENTIAL/PLATELET
Basophils Absolute: 0.1 K/uL (ref 0.0–0.1)
Basophils Relative: 1.1 % (ref 0.0–3.0)
Eosinophils Absolute: 0.4 K/uL (ref 0.0–0.7)
Eosinophils Relative: 5.1 % — ABNORMAL HIGH (ref 0.0–5.0)
HCT: 41.2 % (ref 36.0–46.0)
Hemoglobin: 13.5 g/dL (ref 12.0–15.0)
Lymphocytes Relative: 24.4 % (ref 12.0–46.0)
Lymphs Abs: 1.8 K/uL (ref 0.7–4.0)
MCHC: 32.8 g/dL (ref 30.0–36.0)
MCV: 82.4 fl (ref 78.0–100.0)
Monocytes Absolute: 0.5 K/uL (ref 0.1–1.0)
Monocytes Relative: 7 % (ref 3.0–12.0)
Neutro Abs: 4.5 K/uL (ref 1.4–7.7)
Neutrophils Relative %: 62.4 % (ref 43.0–77.0)
Platelets: 312 K/uL (ref 150.0–400.0)
RBC: 5 Mil/uL (ref 3.87–5.11)
RDW: 14.7 % (ref 11.5–15.5)
WBC: 7.2 K/uL (ref 4.0–10.5)

## 2023-10-15 LAB — BASIC METABOLIC PANEL WITH GFR
BUN: 12 mg/dL (ref 6–23)
CO2: 29 meq/L (ref 19–32)
Calcium: 9.3 mg/dL (ref 8.4–10.5)
Chloride: 102 meq/L (ref 96–112)
Creatinine, Ser: 0.64 mg/dL (ref 0.40–1.20)
GFR: 89.03 mL/min (ref >60.00)
Glucose, Bld: 203 mg/dL — ABNORMAL HIGH (ref 70–99)
Potassium: 4.6 meq/L (ref 3.5–5.1)
Sodium: 138 meq/L (ref 135–145)

## 2023-10-15 NOTE — Assessment & Plan Note (Signed)
 Acute, likely secondary to Mounjaro . Increase MiraLAX  to twice daily continue to increase water and get back to regular activity. Increase fiber in diet.

## 2023-10-15 NOTE — Assessment & Plan Note (Signed)
 Acute, likely secondary to recent constipation.  Start over-the-counter topical Preparation H and treat constipation.  She will let me know if symptoms or not improving as expected.

## 2023-10-15 NOTE — Progress Notes (Signed)
 Patient ID: [name] Strickland, female    DOB: 1950/09/14, 73 y.o.   MRN: [id number]  This visit was conducted in person.  BP 124/78   Pulse (!) 53   Temp 98.4 F (36.9 C) (Oral)   Ht 5' 8 (1.727 m)   Wt 252 lb 9.6 oz (114.6 kg)   SpO2 99%   BMI 38.41 kg/m    CC:  Chief Complaint  Patient presents with   Hospitalization Follow-up    Subjective:   HPI: [name] Strickland is a 73 y.o. female presenting on 10/15/2023 for Hospitalization Follow-up   Hospital admission April 20 to July 22, 2023 for atrial fibrillation with rapid ventricular rate and shortness of breath. Cardioverted at that time, on Tikosyn  and diltiazem   Reviewed Aug 05, 2023 appointment with cardiology Dr. Kennyth  Cardiac CT... scheduled next week. Catheter ablation planned.  On [name]  for anticoagulation.  Hypertension:   Well controlled on current regimen per cards BP Readings from Last 3 Encounters:  10/15/23 124/78  08/05/23 120/78  07/30/23 (!) 160/78  Using medication without problems or lightheadedness:  Chest pain with exertion: Edema: Short of breath: Average home BPs: Other issues:  Diabetes:  well controlled at last check on Mounjaro  2.5 mg weekly and glipizide  10 mg daily ER Lab Results  Component Value Date   HGBA1C 6.5 (H) 07/20/2023  Using medications without difficulties: Hypoglycemic episodes: none Hyperglycemic episodes: none Feet problems:none Blood Sugars averaging: 120-190 eye exam within last year: Wt Readings from Last 3 Encounters:  10/15/23 252 lb 9.6 oz (114.6 kg)  08/05/23 245 lb 3.2 oz (111.2 kg)  07/30/23 244 lb (110.7 kg)   She has been more constipated in last week.  She has irritated hemorrhoid  Miralax  once daily not helping  Drinking lots of water.  Not moving much given air conditioning broken.  Relevant past medical, surgical, family and social history reviewed and updated as indicated. Interim medical history since our last visit reviewed. Allergies and  medications reviewed and updated. Outpatient Medications Prior to Visit  Medication Sig Dispense Refill   acetaminophen  (TYLENOL ) 650 MG CR tablet Take 650 mg by mouth at bedtime as needed for pain.     ALPRAZolam  (XANAX ) 0.25 MG tablet Take 1 tablet (0.25 mg total) by mouth 2 (two) times daily as needed for anxiety. 30 tablet 5   atorvastatin  (LIPITOR) 10 MG tablet Take 1 tablet (10 mg total) by mouth daily. 90 tablet 3   cyclobenzaprine  (FLEXERIL ) 10 MG tablet Take 0.5-1 tablets (5-10 mg total) by mouth at bedtime as needed for muscle spasms. 30 tablet 0   dabigatran  ([name] ) 150 MG CAPS capsule Take 1 capsule (150 mg total) by mouth 2 (two) times daily. 180 capsule 3   diltiazem  (CARDIZEM  CD) 120 MG 24 hr capsule Take 1 capsule (120 mg total) by mouth daily. 30 capsule 6   diltiazem  (CARDIZEM ) 30 MG tablet Take 1 tablet (30 mg total) by mouth every 6 (six) hours as needed (for persistant heart rate greater than 120). 120 tablet 1   dofetilide  (TIKOSYN ) 250 MCG capsule Take 1 capsule (250 mcg total) by mouth 2 (two) times daily. 180 capsule 2   glipiZIDE  (GLUCOTROL  XL) 5 MG 24 hr tablet Take 2 tablets (10 mg total) by mouth daily. 180 tablet 1   predniSONE  (DELTASONE ) 50 MG tablet Take 50 mg tablet 13 hours prior to your CT scan; Take 50 mg 7 hours prior; Take 50 mg tablet 1 hour prior;  Take Zyrtec 10 mg 1 hour prior. 3 tablet 0   tirzepatide  (MOUNJARO ) 2.5 MG/0.5ML Pen Inject 2.5 mg into the skin once a week. 2 mL 11   traMADol  (ULTRAM ) 50 MG tablet Take 1 tablet (50 mg total) by mouth every 12 (twelve) hours as needed. 30 tablet 0   No facility-administered medications prior to visit.     Per HPI unless specifically indicated in ROS section below Review of Systems  Constitutional:  Negative for fatigue and fever.  HENT:  Negative for congestion.   Eyes:  Negative for pain.  Respiratory:  Negative for cough and shortness of breath.   Cardiovascular:  Negative for chest pain, palpitations  and leg swelling.  Gastrointestinal:  Positive for constipation. Negative for abdominal pain.       Passing gas normally  Genitourinary:  Negative for dysuria and vaginal bleeding.  Musculoskeletal:  Negative for back pain.  Neurological:  Negative for syncope, light-headedness and headaches.  Psychiatric/Behavioral:  Negative for dysphoric mood.    Objective:  BP 124/78   Pulse (!) 53   Temp 98.4 F (36.9 C) (Oral)   Ht 5' 8 (1.727 m)   Wt 252 lb 9.6 oz (114.6 kg)   SpO2 99%   BMI 38.41 kg/m   Wt Readings from Last 3 Encounters:  10/15/23 252 lb 9.6 oz (114.6 kg)  08/05/23 245 lb 3.2 oz (111.2 kg)  07/30/23 244 lb (110.7 kg)      Physical Exam Constitutional:      General: She is not in acute distress.    Appearance: Normal appearance. She is well-developed. She is not ill-appearing or toxic-appearing.  HENT:     Head: Normocephalic.     Right Ear: Hearing, tympanic membrane, ear canal and external ear normal. Tympanic membrane is not erythematous, retracted or bulging.     Left Ear: Hearing, tympanic membrane, ear canal and external ear normal. Tympanic membrane is not erythematous, retracted or bulging.     Nose: No mucosal edema or rhinorrhea.     Right Sinus: No maxillary sinus tenderness or frontal sinus tenderness.     Left Sinus: No maxillary sinus tenderness or frontal sinus tenderness.     Mouth/Throat:     Pharynx: Uvula midline.  Eyes:     General: Lids are normal. Lids are everted, no foreign bodies appreciated.     Conjunctiva/sclera: Conjunctivae normal.     Pupils: Pupils are equal, round, and reactive to light.  Neck:     Thyroid: No thyroid mass or thyromegaly.     Vascular: No carotid bruit.     Trachea: Trachea normal.  Cardiovascular:     Rate and Rhythm: Normal rate and regular rhythm.     Pulses: Normal pulses.     Heart sounds: Normal heart sounds, S1 normal and S2 normal. No murmur heard.    No friction rub. No gallop.  Pulmonary:      Effort: Pulmonary effort is normal. No tachypnea or respiratory distress.     Breath sounds: Normal breath sounds. No decreased breath sounds, wheezing, rhonchi or rales.  Abdominal:     General: Abdomen is protuberant. Bowel sounds are normal. There is distension.     Palpations: Abdomen is soft.     Tenderness: There is no abdominal tenderness.  Musculoskeletal:     Cervical back: Normal range of motion and neck supple.  Skin:    General: Skin is warm and dry.     Findings: No rash.  Neurological:  Mental Status: She is alert.  Psychiatric:        Mood and Affect: Mood is not anxious or depressed.        Speech: Speech normal.        Behavior: Behavior normal. Behavior is cooperative.        Thought Content: Thought content normal.        Judgment: Judgment normal.       Results for orders placed or performed during the hospital encounter of 07/20/23  CBC with Differential   Collection Time: 07/20/23  9:09 PM  Result Value Ref Range   WBC 9.2 4.0 - 10.5 K/uL   RBC 5.95 (H) 3.87 - 5.11 MIL/uL   Hemoglobin 16.4 (H) 12.0 - 15.0 g/dL   HCT 50.6 (H) 63.9 - 53.9 %   MCV 82.9 80.0 - 100.0 fL   MCH 27.6 26.0 - 34.0 pg   MCHC 33.3 30.0 - 36.0 g/dL   RDW 86.3 88.4 - 84.4 %   Platelets 371 150 - 400 K/uL   nRBC 0.0 0.0 - 0.2 %   Neutrophils Relative % 49 %   Neutro Abs 4.5 1.7 - 7.7 K/uL   Lymphocytes Relative 39 %   Lymphs Abs 3.6 0.7 - 4.0 K/uL   Monocytes Relative 7 %   Monocytes Absolute 0.7 0.1 - 1.0 K/uL   Eosinophils Relative 4 %   Eosinophils Absolute 0.4 0.0 - 0.5 K/uL   Basophils Relative 1 %   Basophils Absolute 0.1 0.0 - 0.1 K/uL   Immature Granulocytes 0 %   Abs Immature Granulocytes 0.03 0.00 - 0.07 K/uL  Comprehensive metabolic panel   Collection Time: 07/20/23  9:09 PM  Result Value Ref Range   Sodium 138 135 - 145 mmol/L   Potassium 3.7 3.5 - 5.1 mmol/L   Chloride 104 98 - 111 mmol/L   CO2 23 22 - 32 mmol/L   Glucose, Bld 140 (H) 70 - 99 mg/dL   BUN  13 8 - 23 mg/dL   Creatinine, Ser 9.28 0.44 - 1.00 mg/dL   Calcium  9.6 8.9 - 10.3 mg/dL   Total Protein 8.1 6.5 - 8.1 g/dL   Albumin 4.2 3.5 - 5.0 g/dL   AST 20 15 - 41 U/L   ALT 14 0 - 44 U/L   Alkaline Phosphatase 54 38 - 126 U/L   Total Bilirubin 0.6 0.0 - 1.2 mg/dL   GFR, Estimated >39 >39 mL/min   Anion gap 11 5 - 15  Magnesium    Collection Time: 07/20/23  9:09 PM  Result Value Ref Range   Magnesium  2.1 1.7 - 2.4 mg/dL  Hemoglobin J8r   Collection Time: 07/20/23  9:09 PM  Result Value Ref Range   Hgb A1c MFr Bld 6.5 (H) 4.8 - 5.6 %   Mean Plasma Glucose 139.85 mg/dL  Troponin I (High Sensitivity)   Collection Time: 07/20/23  9:09 PM  Result Value Ref Range   Troponin I (High Sensitivity) 7 <18 ng/L  TSH   Collection Time: 07/20/23 10:47 PM  Result Value Ref Range   TSH 4.624 (H) 0.350 - 4.500 uIU/mL  Troponin I (High Sensitivity)   Collection Time: 07/20/23 10:47 PM  Result Value Ref Range   Troponin I (High Sensitivity) 6 <18 ng/L  Basic metabolic panel   Collection Time: 07/21/23  5:29 AM  Result Value Ref Range   Sodium 139 135 - 145 mmol/L   Potassium 3.7 3.5 - 5.1 mmol/L   Chloride 107  98 - 111 mmol/L   CO2 22 22 - 32 mmol/L   Glucose, Bld 144 (H) 70 - 99 mg/dL   BUN 12 8 - 23 mg/dL   Creatinine, Ser 9.27 0.44 - 1.00 mg/dL   Calcium  9.1 8.9 - 10.3 mg/dL   GFR, Estimated >39 >39 mL/min   Anion gap 10 5 - 15  CBC   Collection Time: 07/21/23  5:29 AM  Result Value Ref Range   WBC 7.9 4.0 - 10.5 K/uL   RBC 5.54 (H) 3.87 - 5.11 MIL/uL   Hemoglobin 15.2 (H) 12.0 - 15.0 g/dL   HCT 54.3 63.9 - 53.9 %   MCV 82.3 80.0 - 100.0 fL   MCH 27.4 26.0 - 34.0 pg   MCHC 33.3 30.0 - 36.0 g/dL   RDW 86.3 88.4 - 84.4 %   Platelets 303 150 - 400 K/uL   nRBC 0.0 0.0 - 0.2 %  Brain natriuretic peptide   Collection Time: 07/21/23  5:29 AM  Result Value Ref Range   B Natriuretic Peptide 269.7 (H) 0.0 - 100.0 pg/mL  Glucose, capillary   Collection Time: 07/21/23  9:55 PM   Result Value Ref Range   Glucose-Capillary 151 (H) 70 - 99 mg/dL  ECHOCARDIOGRAM COMPLETE   Collection Time: 07/21/23 10:16 PM  Result Value Ref Range   Weight 3,776 oz   BP 131/86 mmHg   S' Lateral 2.60 cm   AV Area VTI 1.54 cm2   AR max vel 1.67 cm2   AV Area mean vel 1.57 cm2   Est EF 55 - 60%    Ao pk vel 1.27 m/s   AV Peak grad 6.4 mmHg   AV Mean grad 3.5 mmHg  Glucose, capillary   Collection Time: 07/22/23  7:33 AM  Result Value Ref Range   Glucose-Capillary 186 (H) 70 - 99 mg/dL  Basic metabolic panel with GFR   Collection Time: 07/22/23  9:03 AM  Result Value Ref Range   Sodium 138 135 - 145 mmol/L   Potassium 3.8 3.5 - 5.1 mmol/L   Chloride 106 98 - 111 mmol/L   CO2 22 22 - 32 mmol/L   Glucose, Bld 182 (H) 70 - 99 mg/dL   BUN 20 8 - 23 mg/dL   Creatinine, Ser 9.15 0.44 - 1.00 mg/dL   Calcium  9.0 8.9 - 10.3 mg/dL   GFR, Estimated >39 >39 mL/min   Anion gap 10 5 - 15  Glucose, capillary   Collection Time: 07/22/23 12:35 PM  Result Value Ref Range   Glucose-Capillary 230 (H) 70 - 99 mg/dL    Assessment and Plan  Essential hypertension Assessment & Plan: Stable, chronic.  Continue current medication.     Type 2 diabetes mellitus with hyperglycemia, without long-term current use of insulin  (HCC) Assessment & Plan: Chronic, improved control Check A1c at upcoming labs prior to physical.  Continue Glucotrol  XL 10 mg daily  Tolerating Mounjaro  2.5 mg weekly   Paroxysmal atrial fibrillation [facility]) Assessment & Plan: Has upcoming cardiac ablation planned.  Needs be met and CBC preop.  Orders: -     Basic metabolic panel with GFR -     CBC with Differential/Platelet  Drug-induced constipation Assessment & Plan: Acute, likely secondary to Mounjaro . Increase MiraLAX  to twice daily continue to increase water and get back to regular activity. Increase fiber in diet.   Hemorrhoids, unspecified hemorrhoid type Assessment & Plan: Acute, likely secondary  to recent constipation.  Start over-the-counter topical Preparation H  and treat constipation.  She will let me know if symptoms or not improving as expected.     Return if symptoms worsen or fail to improve.   Greig Ring, MD

## 2023-10-15 NOTE — Assessment & Plan Note (Signed)
Stable, chronic.  Continue current medication.    

## 2023-10-15 NOTE — Assessment & Plan Note (Addendum)
 Chronic, improved control Check A1c at upcoming labs prior to physical.  Continue Glucotrol  XL 10 mg daily  Tolerating Mounjaro  2.5 mg weekly

## 2023-10-15 NOTE — Patient Instructions (Addendum)
 Increase Miralax  2 times daily.  Start over the counter preparation H for hemorrhoid. Let me know if it is not improving as expected.

## 2023-10-15 NOTE — Assessment & Plan Note (Signed)
 Has upcoming cardiac ablation planned.  Needs be met and CBC preop.

## 2023-10-16 ENCOUNTER — Other Ambulatory Visit: Payer: Self-pay

## 2023-10-16 ENCOUNTER — Ambulatory Visit: Payer: Self-pay | Admitting: Family Medicine

## 2023-10-23 ENCOUNTER — Ambulatory Visit
Admission: RE | Admit: 2023-10-23 | Discharge: 2023-10-23 | Disposition: A | Discharge status: Home / Self Care | Source: Ambulatory Visit | Attending: Cardiology | Admitting: Cardiology

## 2023-10-23 DIAGNOSIS — I480 Paroxysmal atrial fibrillation: Secondary | ICD-10-CM | POA: Diagnosis not present

## 2023-10-23 MED ORDER — IOHEXOL 350 MG/ML SOLN
100.0000 mL | Freq: Once | INTRAVENOUS | Status: AC | PRN
  Administered 2023-10-23: 100 mL via INTRAVENOUS

## 2023-10-24 ENCOUNTER — Ambulatory Visit: Admission: RE | Admit: 2023-10-24 | Source: Ambulatory Visit

## 2023-10-29 MED FILL — Diltiazem HCl Coated Beads Cap ER 24HR 120 MG: ORAL | 30 days supply | Qty: 30 | Fill #3 | Status: CN

## 2023-11-04 ENCOUNTER — Other Ambulatory Visit: Payer: Self-pay

## 2023-11-06 ENCOUNTER — Telehealth (HOSPITAL_COMMUNITY): Payer: Self-pay

## 2023-11-06 NOTE — Telephone Encounter (Signed)
 Attempted to reach patient to discuss upcoming procedure, no answer. Left VM for patient to return call.

## 2023-11-06 NOTE — Telephone Encounter (Signed)
 Patient returned call to discuss upcoming procedure.   CT: completed. Labs: completed.   Any recent signs of acute illness or been started on antibiotics? No Any new medications started? No Any medications to hold?  Hold Mounjaro  for 1 week prior to the procedure- last dose on August 02. OK to take your morning dose of Glipizide  the day before your procedure.   Any missed doses of blood thinner? No  Advised patient to continue taking ANTICOAGULANT: [name]  (Dabigatran ) twice daily without missing any doses.  Medication instructions:  On the morning of your procedure DO NOT take any medication., including [name]  or the procedure may be rescheduled. Nothing to eat or drink after midnight prior to your procedure.  Confirmed patient is scheduled for Atrial Fibrillation Ablation on Friday, August 15 with Dr. Sidra Kitty. Instructed patient to arrive at the Main Entrance A at [facility]: [address] [place], [place] [address] and check in at Admitting at 8:00 AM.   Advised of plan to go home the same day and will only stay overnight if medically necessary. You MUST have a responsible adult to drive you home and MUST be with you the first 24 hours after you arrive home or your procedure could be cancelled.  Patient verbalized understanding to all instructions provided and agreed to proceed with procedure.

## 2023-11-10 MED FILL — Diltiazem HCl Coated Beads Cap ER 24HR 120 MG: ORAL | 30 days supply | Qty: 30 | Fill #3 | Status: AC

## 2023-11-13 NOTE — Anesthesia Preprocedure Evaluation (Addendum)
 Anesthesia Evaluation  Patient identified by MRN, date of birth, ID band Patient awake    Reviewed: Allergy & Precautions, NPO status , Patient's Chart, lab work & pertinent test results  History of Anesthesia Complications Negative for: history of anesthetic complications  Airway Mallampati: III  TM Distance: >3 FB Neck ROM: Full    Dental  (+) Missing, Chipped, Poor Dentition, Dental Advisory Given   Pulmonary neg shortness of breath, sleep apnea (snores, no sleep study) , neg COPD, neg recent URI   breath sounds clear to auscultation       Cardiovascular hypertension, Pt. on medications (-) angina (-) Past MI + dysrhythmias (pradaxa ) Atrial Fibrillation  Rhythm:Irregular  Echo 07/2023 1. Left ventricular ejection fraction, by estimation, is 55 to 60%. The  left ventricle has normal function. The left ventricle has no regional  wall motion abnormalities. There is mild left ventricular hypertrophy.  Left ventricular diastolic parameters  are indeterminate.   2. Right ventricular systolic function is low normal. The right  ventricular size is normal.   3. The mitral valve is normal in structure. Trivial mitral valve  regurgitation.   4. The aortic valve is tricuspid. There is moderate calcification of the  aortic valve. There is moderate thickening of the aortic valve. Aortic  valve regurgitation is not visualized. Aortic valve  sclerosis/calcification is present, without any evidence  of aortic stenosis.   5. There is mild dilatation of the ascending aorta, measuring 39 mm.   6. The inferior vena cava is normal in size with <50% respiratory  variability, suggesting right atrial pressure of 8 mmHg.     Neuro/Psych negative neurological ROS  negative psych ROS   GI/Hepatic negative GI ROS, Neg liver ROS,,,  Endo/Other  diabetes, Well Controlled, Type 2, Oral Hypoglycemic Agents    Renal/GU negative Renal ROS      Musculoskeletal negative musculoskeletal ROS (+)    Abdominal   Peds  Hematology negative hematology ROS (+)   Anesthesia Other Findings Mounjaro  LD:   Reproductive/Obstetrics                              Anesthesia Physical Anesthesia Plan  ASA: 3  Anesthesia Plan: General   Post-op Pain Management: Minimal or no pain anticipated   Induction: Intravenous  PONV Risk Score and Plan: 3 and Ondansetron , Dexamethasone  and Treatment may vary due to age or medical condition  Airway Management Planned: Oral ETT  Additional Equipment: None  Intra-op Plan:   Post-operative Plan: Extubation in OR  Informed Consent: I have reviewed the patients History and Physical, chart, labs and discussed the procedure including the risks, benefits and alternatives for the proposed anesthesia with the patient or authorized representative who has indicated his/her understanding and acceptance.     Dental advisory given  Plan Discussed with: CRNA  Anesthesia Plan Comments:          Anesthesia Quick Evaluation

## 2023-11-14 ENCOUNTER — Other Ambulatory Visit: Payer: Self-pay

## 2023-11-14 ENCOUNTER — Ambulatory Visit (HOSPITAL_COMMUNITY): Admitting: Anesthesiology

## 2023-11-14 ENCOUNTER — Ambulatory Visit (HOSPITAL_COMMUNITY)
Admission: RE | Admit: 2023-11-14 | Discharge: 2023-11-14 | Disposition: A | Discharge status: Home / Self Care | Attending: Cardiology | Admitting: Cardiology

## 2023-11-14 ENCOUNTER — Ambulatory Visit (HOSPITAL_COMMUNITY)
Admission: RE | Disposition: A | Discharge status: Home / Self Care | Payer: Self-pay | Source: Home / Self Care | Attending: Cardiology

## 2023-11-14 DIAGNOSIS — Z7984 Long term (current) use of oral hypoglycemic drugs: Secondary | ICD-10-CM | POA: Insufficient documentation

## 2023-11-14 DIAGNOSIS — E119 Type 2 diabetes mellitus without complications: Secondary | ICD-10-CM | POA: Insufficient documentation

## 2023-11-14 DIAGNOSIS — D6869 Other thrombophilia: Secondary | ICD-10-CM | POA: Diagnosis not present

## 2023-11-14 DIAGNOSIS — E785 Hyperlipidemia, unspecified: Secondary | ICD-10-CM | POA: Diagnosis not present

## 2023-11-14 DIAGNOSIS — I10 Essential (primary) hypertension: Secondary | ICD-10-CM | POA: Diagnosis not present

## 2023-11-14 DIAGNOSIS — I4819 Other persistent atrial fibrillation: Secondary | ICD-10-CM

## 2023-11-14 DIAGNOSIS — Z7901 Long term (current) use of anticoagulants: Secondary | ICD-10-CM | POA: Insufficient documentation

## 2023-11-14 HISTORY — PX: ATRIAL FIBRILLATION ABLATION: EP1191

## 2023-11-14 LAB — GLUCOSE, CAPILLARY
Glucose-Capillary: 198 mg/dL — ABNORMAL HIGH (ref 70–99)
Glucose-Capillary: 212 mg/dL — ABNORMAL HIGH (ref 70–99)

## 2023-11-14 LAB — POCT ACTIVATED CLOTTING TIME: Activated Clotting Time: 383 s

## 2023-11-14 MED ORDER — FENTANYL CITRATE (PF) 100 MCG/2ML IJ SOLN
INTRAMUSCULAR | Status: AC
  Filled 2023-11-14: qty 2

## 2023-11-14 MED ORDER — ATROPINE SULFATE 1 MG/10ML IJ SOSY
PREFILLED_SYRINGE | INTRAMUSCULAR | Status: DC | PRN
  Administered 2023-11-14: 1 mg via INTRAVENOUS

## 2023-11-14 MED ORDER — SODIUM CHLORIDE 0.9% FLUSH: 3.0000 mL | INTRAVENOUS | Status: DC | PRN

## 2023-11-14 MED ORDER — SODIUM CHLORIDE 0.9 % IV SOLN: 250.0000 mL | INTRAVENOUS | Status: DC | PRN

## 2023-11-14 MED ORDER — ONDANSETRON HCL 4 MG/2ML IJ SOLN: 4.0000 mg | Freq: Four times a day (QID) | INTRAMUSCULAR | Status: DC | PRN

## 2023-11-14 MED ORDER — LIDOCAINE-EPINEPHRINE 1 %-1:100000 IJ SOLN
INTRAMUSCULAR | Status: DC | PRN
Start: 2023-11-14 — End: 2023-11-14
  Administered 2023-11-14: 5 mL

## 2023-11-14 MED ORDER — ROCURONIUM BROMIDE 10 MG/ML (PF) SYRINGE
PREFILLED_SYRINGE | INTRAVENOUS | Status: DC | PRN
  Administered 2023-11-14: 5 mg via INTRAVENOUS
  Administered 2023-11-14: 70 mg via INTRAVENOUS

## 2023-11-14 MED ORDER — CEFAZOLIN SODIUM-DEXTROSE 2-4 GM/100ML-% IV SOLN
INTRAVENOUS | Status: AC
  Filled 2023-11-14: qty 100

## 2023-11-14 MED ORDER — ATORVASTATIN CALCIUM 20 MG PO TABS
20.0000 mg | ORAL_TABLET | Freq: Every day | ORAL | 1 refills | Status: AC
  Filled 2023-11-14: qty 90, 90d supply, fill #0

## 2023-11-14 MED ORDER — KETOROLAC TROMETHAMINE 30 MG/ML IJ SOLN
INTRAMUSCULAR | Status: AC
  Filled 2023-11-14: qty 1

## 2023-11-14 MED ORDER — HEPARIN (PORCINE) IN NACL 1000-0.9 UT/500ML-% IV SOLN
INTRAVENOUS | Status: DC | PRN
Start: 2023-11-14 — End: 2023-11-14
  Administered 2023-11-14 (×2): 500 mL

## 2023-11-14 MED ORDER — HYDRALAZINE HCL 20 MG/ML IJ SOLN
INTRAMUSCULAR | Status: DC | PRN
  Administered 2023-11-14: 5 mg via INTRAVENOUS

## 2023-11-14 MED ORDER — METOPROLOL TARTRATE 5 MG/5ML IV SOLN
INTRAVENOUS | Status: AC
  Filled 2023-11-14: qty 5

## 2023-11-14 MED ORDER — DEXAMETHASONE SODIUM PHOSPHATE 10 MG/ML IJ SOLN
INTRAMUSCULAR | Status: DC | PRN
  Administered 2023-11-14: 2 mg via INTRAVENOUS

## 2023-11-14 MED ORDER — HEPARIN SODIUM (PORCINE) 1000 UNIT/ML IJ SOLN
INTRAMUSCULAR | Status: AC
  Filled 2023-11-14: qty 10

## 2023-11-14 MED ORDER — SODIUM CHLORIDE 0.9 % IV SOLN: INTRAVENOUS | Status: DC

## 2023-11-14 MED ORDER — PHENYLEPHRINE 80 MCG/ML (10ML) SYRINGE FOR IV PUSH (FOR BLOOD PRESSURE SUPPORT)
PREFILLED_SYRINGE | INTRAVENOUS | Status: DC | PRN
  Administered 2023-11-14: 80 ug via INTRAVENOUS
  Administered 2023-11-14: 120 ug via INTRAVENOUS

## 2023-11-14 MED ORDER — SUGAMMADEX SODIUM 200 MG/2ML IV SOLN
INTRAVENOUS | Status: DC | PRN
  Administered 2023-11-14: 200 mg via INTRAVENOUS

## 2023-11-14 MED ORDER — HEPARIN SODIUM (PORCINE) 1000 UNIT/ML IJ SOLN
INTRAMUSCULAR | Status: DC | PRN
Start: 2023-11-14 — End: 2023-11-14
  Administered 2023-11-14: 18000 [IU] via INTRAVENOUS

## 2023-11-14 MED ORDER — PROTAMINE SULFATE 10 MG/ML IV SOLN
INTRAVENOUS | Status: DC | PRN
  Administered 2023-11-14: 35 mg via INTRAVENOUS

## 2023-11-14 MED ORDER — DABIGATRAN ETEXILATE MESYLATE 150 MG PO CAPS
150.0000 mg | ORAL_CAPSULE | Freq: Once | ORAL | Status: AC
  Administered 2023-11-14: 150 mg via ORAL
  Filled 2023-11-14: qty 1

## 2023-11-14 MED ORDER — PROPOFOL 10 MG/ML IV BOLUS
INTRAVENOUS | Status: DC | PRN
  Administered 2023-11-14: 10 mg via INTRAVENOUS
  Administered 2023-11-14: 20 mg via INTRAVENOUS
  Administered 2023-11-14: 50 mg via INTRAVENOUS
  Administered 2023-11-14: 120 mg via INTRAVENOUS
  Administered 2023-11-14: 70 mg via INTRAVENOUS

## 2023-11-14 MED ORDER — ONDANSETRON HCL 4 MG/2ML IJ SOLN
INTRAMUSCULAR | Status: DC | PRN
  Administered 2023-11-14: 4 mg via INTRAVENOUS

## 2023-11-14 MED ORDER — SODIUM CHLORIDE 0.9% FLUSH: 3.0000 mL | Freq: Two times a day (BID) | INTRAVENOUS | Status: DC

## 2023-11-14 MED ORDER — LIDOCAINE-EPINEPHRINE 1 %-1:100000 IJ SOLN
INTRAMUSCULAR | Status: AC
Start: 2023-11-14 — End: 2023-11-14
  Filled 2023-11-14: qty 1

## 2023-11-14 MED ORDER — KETOROLAC TROMETHAMINE 30 MG/ML IJ SOLN
30.0000 mg | Freq: Once | INTRAMUSCULAR | Status: AC
  Administered 2023-11-14: 30 mg via INTRAVENOUS

## 2023-11-14 MED ORDER — ACETAMINOPHEN 325 MG PO TABS
650.0000 mg | ORAL_TABLET | ORAL | Status: DC | PRN
  Administered 2023-11-14: 650 mg via ORAL

## 2023-11-14 MED ORDER — CEFAZOLIN SODIUM-DEXTROSE 2-4 GM/100ML-% IV SOLN
2.0000 g | Freq: Once | INTRAVENOUS | Status: AC
  Administered 2023-11-14: 2 g via INTRAVENOUS

## 2023-11-14 MED ORDER — ACETAMINOPHEN 325 MG PO TABS
ORAL_TABLET | ORAL | Status: DC
Start: 2023-11-14 — End: 2023-11-14
  Filled 2023-11-14: qty 2

## 2023-11-14 MED ORDER — LIDOCAINE 2% (20 MG/ML) 5 ML SYRINGE
INTRAMUSCULAR | Status: DC | PRN
  Administered 2023-11-14: 60 mg via INTRAVENOUS

## 2023-11-14 MED ORDER — METOPROLOL TARTRATE 5 MG/5ML IV SOLN
5.0000 mg | Freq: Once | INTRAVENOUS | Status: AC
  Administered 2023-11-14: 5 mg via INTRAVENOUS

## 2023-11-14 MED ORDER — FENTANYL CITRATE (PF) 250 MCG/5ML IJ SOLN
INTRAMUSCULAR | Status: DC | PRN
  Administered 2023-11-14: 75 ug via INTRAVENOUS
  Administered 2023-11-14: 25 ug via INTRAVENOUS
  Administered 2023-11-14: 50 ug via INTRAVENOUS

## 2023-11-14 NOTE — Progress Notes (Signed)
 Patient and daughter was given discharge instructions. Both verbalized understanding.

## 2023-11-14 NOTE — Transfer of Care (Signed)
 Immediate Anesthesia Transfer of Care Note  Patient: [name] Strickland  Procedure(s) Performed: ATRIAL FIBRILLATION ABLATION  Patient Location: Cath Lab  Anesthesia Type:General  Level of Consciousness: awake and alert   Airway & Oxygen Therapy: Patient Spontanous Breathing and Patient connected to face mask oxygen  Post-op Assessment: Report given to RN and Post -op Vital signs reviewed and stable  Post vital signs: Reviewed and stable  Last Vitals:  Vitals Value Taken Time  BP    Temp    Pulse 110 11/14/23 11:59  Resp 14 11/14/23 11:59  SpO2 93 % 11/14/23 11:59  Vitals shown include unfiled device data.  Last Pain:  Vitals:   11/14/23 0834  TempSrc:   PainSc: 0-No pain         Complications: No notable events documented.

## 2023-11-14 NOTE — Anesthesia Procedure Notes (Signed)
 Procedure Name: Intubation Date/Time: 11/14/2023 10:12 AM  Performed by: Roslynn Waddell LABOR, CRNAPre-anesthesia Checklist: Patient identified, Emergency Drugs available, Suction available and Patient being monitored Patient Re-evaluated:Patient Re-evaluated prior to induction Oxygen Delivery Method: Circle System Utilized Preoxygenation: Pre-oxygenation with 100% oxygen Induction Type: IV induction Ventilation: Mask ventilation without difficulty Laryngoscope Size: Mac and 3 Grade View: Grade I Tube type: Oral Tube size: 7.0 mm Number of attempts: 1 Airway Equipment and Method: Stylet and Oral airway Placement Confirmation: ETT inserted through vocal cords under direct vision, positive ETCO2 and breath sounds checked- equal and bilateral Secured at: 21 cm Tube secured with: Tape Dental Injury: Teeth and Oropharynx as per pre-operative assessment  Comments: Atraumatic induction/intubation. Patient with very poor dentition (chipped, broken, and discolored). Dentition and oral mucosa undamaged and as per preop.

## 2023-11-14 NOTE — Discharge Instructions (Signed)

## 2023-11-14 NOTE — H&P (Signed)
 Electrophysiology Note:   Date:  11/14/23  ID:  [name] Strickland, DOB 1950-10-17, MRN [id number]   Primary Cardiologist: None Electrophysiologist: Danelle Birmingham, MD       History of Present Illness:   [name] Strickland is a 73 y.o. female with h/o paroxysmal atrial fibrillation s/p PVI and CTI ablation 07/2014 on Tikosyn  and [name] ,  hypertension, hyperlipidemia, and T2DM who is being seen today for post hospital discharge follow up evaluation.   Discussed the use of AI scribe software for clinical note transcription with the patient, who gave verbal consent to proceed.   History of Present Illness She presented to the ED on 07/20/2023 with a chief complaint of palpitations.  Her palpitations were accompanied by dyspnea on exertion, fatigue and lightheadedness.  She was found to be in atrial fibrillation.  She underwent successful cardioversion on 07/22/2023. Since her discharge, she has remained in normal sinus rhythm without any episodes of arrhythmia. She is currently on Tikosyn , which she started in July 2024 after flecainide  became ineffective, and she has been tolerating it well. She underwent an ablation in 2016 and is considering another ablation due to the potential for future episodes of atrial fibrillation. After her previous ablation, she experienced significant fatigue and was unable to work for weeks. She manages her condition by taking an extra 30 mg of diltiazem  and resting when she experiences symptoms, which has been effective in resolving episodes in the past. She denies chest pain, shortness of breath, palpitations, lower extremity edema.     Interval: Patient presents today for scheduled catheter ablation. Reports doing relatively well. Nervous about procedure but otherwise no new or acute complaints.   Review of systems complete and found to be negative unless listed in HPI.    EP Information / Studies Reviewed:      EKG Interpretation Date/Time:                  Tuesday Aug 05 2023  10:08:06 EDT Ventricular Rate:         62 PR Interval:                   QRS Duration:             64 QT Interval:                 412 QTC Calculation:418 R Axis:                         13   Text Interpretation:Sinus rhythm Low voltage QRS Septal infarct , age undetermined When compared with ECG of 22-Jul-2023 14:41, QT has shortened Confirmed by Kennyth Chew [phone]) on 08/05/2023 10:17:01 AM    Echo 07/21/23:   1. Left ventricular ejection fraction, by estimation, is 55 to 60%. The  left ventricle has normal function. The left ventricle has no regional  wall motion abnormalities. There is mild left ventricular hypertrophy.  Left ventricular diastolic parameters  are indeterminate.   2. Right ventricular systolic function is low normal. The right  ventricular size is normal.   3. The mitral valve is normal in structure. Trivial mitral valve  regurgitation.   4. The aortic valve is tricuspid. There is moderate calcification of the  aortic valve. There is moderate thickening of the aortic valve. Aortic  valve regurgitation is not visualized. Aortic valve  sclerosis/calcification is present, without any evidence  of aortic stenosis.   5. There is mild dilatation  of the ascending aorta, measuring 39 mm.   6. The inferior vena cava is normal in size with <50% respiratory  variability, suggesting right atrial pressure of 8 mmHg.      Risk Assessment/Calculations:     CHA2DS2-VASc Score = 4   This indicates a 4.8% annual risk of stroke. The patient's score is based upon: CHF History: 0 HTN History: 1 Diabetes History: 1 Stroke History: 0 Vascular Disease History: 0 Age Score: 1 Gender Score: 1               Physical Exam:    Today's Vitals   11/14/23 0817 11/14/23 0834  BP: (!) 160/66   Pulse: (!) 50   Resp: 17   Temp: 98.2 F (36.8 C)   TempSrc: Oral   SpO2: 96%   Weight: 112.9 kg   Height: 5' 8 (1.727 m)   PainSc:  0-No pain   Body mass index is 37.86  kg/m.  GEN: Well nourished, well developed in no acute distress NECK: No JVD CARDIAC: Normal rate, regular rhythm RESPIRATORY:  Clear to auscultation without rales, wheezing or rhonchi  ABDOMEN: Soft, non-distended EXTREMITIES:  No edema; No deformity    ASSESSMENT AND PLAN:     #. Persistent atrial fibrillation, symptomatic: S/p ablation in 2016. Recurrence despite Tikosyn . S/p cardioversoin 07/22/23.  #. High risk medication use: Tikosyn .  -Discussed treatment options today for AF including antiarrhythmic drug therapy and ablation. Discussed risks, recovery and likelihood of success with each treatment strategy. Risk, benefits, and alternatives to EP study and ablation for afib were discussed. These risks include but are not limited to stroke, bleeding, vascular damage, tamponade, perforation, damage to the esophagus, lungs, phrenic nerve and other structures, pulmonary vein stenosis, worsening renal function, coronary vasospasm and death.  Discussed potential need for repeat ablation procedures and antiarrhythmic drugs after an initial ablation. The patient understands these risk and wishes to proceed today. -Continue Tikosyn  250mcg BID for now.  -Continue home diltiazem  120mg  daily for now.    #. Secondary hypercoagulable state due to atrial fibrillation: CHADSVASC score of 4. -Continue [name]  150mg  twice daily.     Follow up with Dr. Kennyth 3 months after ablation.    Signed, Fonda Kennyth, MD

## 2023-11-15 ENCOUNTER — Encounter (HOSPITAL_COMMUNITY): Payer: Self-pay | Admitting: Cardiology

## 2023-11-17 ENCOUNTER — Telehealth (HOSPITAL_COMMUNITY): Payer: Self-pay

## 2023-11-17 NOTE — Telephone Encounter (Signed)
 Attempted to reach patient to follow up with procedure completed on 11/14/23, no answer. Left VM for patient to return call.

## 2023-11-17 NOTE — Anesthesia Postprocedure Evaluation (Signed)
 Anesthesia Post Note  Patient: [name] Strickland  Procedure(s) Performed: ATRIAL FIBRILLATION ABLATION     Patient location during evaluation: PACU Anesthesia Type: General Level of consciousness: awake and alert Pain management: pain level controlled Vital Signs Assessment: post-procedure vital signs reviewed and stable Respiratory status: spontaneous breathing, nonlabored ventilation and respiratory function stable Cardiovascular status: blood pressure returned to baseline and stable Postop Assessment: no apparent nausea or vomiting Anesthetic complications: no   No notable events documented.                Fredick Schlosser

## 2023-11-17 NOTE — Telephone Encounter (Signed)
 Spoke with patient to complete post procedure follow up call.  Patient reports no complications with groin sites.   Instructions reviewed with patient:  It is normal to have bruising, tenderness, mild swelling, and a pea or marble sized lump/knot at the groin site which can take up to three months to resolve.  Get help right away if you notice sudden swelling at the puncture site.  Check your puncture site every day for signs of infection: fever, redness, swelling, pus drainage, warmth, foul odor or excessive pain. If this occurs, please call the office at [phone], to speak with the nurse. Get help right away if your puncture site is bleeding and the bleeding does not stop after applying firm pressure to the area.  You may continue to have skipped beats/ atrial fibrillation during the first several months after your procedure.  It is very important not to miss any doses of your blood thinner [name] .  You will follow up with the APP on 12/23/23 and follow up with the APP on 02/16/24.   Patient verbalized understanding to all instructions provided.

## 2023-11-20 MED FILL — Fentanyl Citrate Preservative Free (PF) Inj 100 MCG/2ML: INTRAMUSCULAR | Qty: 3 | Status: AC

## 2023-11-27 MED FILL — Dofetilide Cap 250 MCG (0.25 MG): ORAL | 90 days supply | Qty: 180 | Fill #1 | Status: AC

## 2023-11-28 ENCOUNTER — Ambulatory Visit: Payer: Self-pay | Admitting: Family Medicine

## 2023-11-28 ENCOUNTER — Encounter: Payer: Self-pay | Admitting: Family Medicine

## 2023-11-28 ENCOUNTER — Ambulatory Visit: Admitting: Family Medicine

## 2023-11-28 VITALS — BP 132/74 | HR 62 | Temp 98.2°F | Ht 66.5 in | Wt 248.0 lb

## 2023-11-28 DIAGNOSIS — Z0000 Encounter for general adult medical examination without abnormal findings: Secondary | ICD-10-CM | POA: Diagnosis not present

## 2023-11-28 DIAGNOSIS — E1165 Type 2 diabetes mellitus with hyperglycemia: Secondary | ICD-10-CM | POA: Diagnosis not present

## 2023-11-28 DIAGNOSIS — E66812 Obesity, class 2: Secondary | ICD-10-CM | POA: Diagnosis not present

## 2023-11-28 DIAGNOSIS — Z1211 Encounter for screening for malignant neoplasm of colon: Secondary | ICD-10-CM

## 2023-11-28 DIAGNOSIS — Z6838 Body mass index (BMI) 38.0-38.9, adult: Secondary | ICD-10-CM

## 2023-11-28 DIAGNOSIS — E1169 Type 2 diabetes mellitus with other specified complication: Secondary | ICD-10-CM

## 2023-11-28 DIAGNOSIS — I4891 Unspecified atrial fibrillation: Secondary | ICD-10-CM

## 2023-11-28 DIAGNOSIS — E785 Hyperlipidemia, unspecified: Secondary | ICD-10-CM

## 2023-11-28 DIAGNOSIS — I10 Essential (primary) hypertension: Secondary | ICD-10-CM | POA: Diagnosis not present

## 2023-11-28 DIAGNOSIS — R195 Other fecal abnormalities: Secondary | ICD-10-CM

## 2023-11-28 LAB — COMPREHENSIVE METABOLIC PANEL WITH GFR
ALT: 14 U/L (ref 0–35)
AST: 18 U/L (ref 0–37)
Albumin: 4.5 g/dL (ref 3.5–5.2)
Alkaline Phosphatase: 68 U/L (ref 39–117)
BUN: 11 mg/dL (ref 6–23)
CO2: 27 meq/L (ref 19–32)
Calcium: 9.1 mg/dL (ref 8.4–10.5)
Chloride: 102 meq/L (ref 96–112)
Creatinine, Ser: 0.74 mg/dL (ref 0.40–1.20)
GFR: 81.43 mL/min (ref >60.00)
Glucose, Bld: 162 mg/dL — ABNORMAL HIGH (ref 70–99)
Potassium: 4.2 meq/L (ref 3.5–5.1)
Sodium: 139 meq/L (ref 135–145)
Total Bilirubin: 0.8 mg/dL (ref 0.2–1.2)
Total Protein: 7.5 g/dL (ref 6.0–8.3)

## 2023-11-28 LAB — LIPID PANEL
Cholesterol: 185 mg/dL (ref 0–200)
HDL: 55.6 mg/dL (ref >39.00)
LDL Cholesterol: 91 mg/dL (ref 0–99)
NonHDL: 129.39
Total CHOL/HDL Ratio: 3
Triglycerides: 193 mg/dL — ABNORMAL HIGH (ref 0.0–149.0)
VLDL: 38.6 mg/dL (ref 0.0–40.0)

## 2023-11-28 LAB — MICROALBUMIN / CREATININE URINE RATIO
Creatinine,U: 181.2 mg/dL
Microalb Creat Ratio: 8.6 mg/g (ref 0.0–30.0)
Microalb, Ur: 1.6 mg/dL (ref 0.0–1.9)

## 2023-11-28 LAB — HEMOGLOBIN A1C: Hgb A1c MFr Bld: 8.5 % — ABNORMAL HIGH (ref 4.6–6.5)

## 2023-11-28 NOTE — Assessment & Plan Note (Signed)
 Stable, chronic.  Continue current medication.

## 2023-11-28 NOTE — Assessment & Plan Note (Signed)
 Encouraged exercise, weight loss, healthy eating habits. ? ?

## 2023-11-28 NOTE — Assessment & Plan Note (Addendum)
Due for reevaluation  On atorvastatin 20 mg daily

## 2023-11-28 NOTE — Assessment & Plan Note (Signed)
 Due for reevaluation

## 2023-11-28 NOTE — Progress Notes (Signed)
 Patient ID: [name] Strickland, female    DOB: 08/03/1950, 73 y.o.   MRN: [id number]  This visit was conducted in person.  BP 132/74   Pulse 62   Temp 98.2 F (36.8 C) (Oral)   Ht 5' 6.5 (1.689 m)   Wt 248 lb (112.5 kg)   SpO2 98%   BMI 39.43 kg/m    CC:  Chief Complaint  Patient presents with   Annual Exam    Subjective:   HPI: [name] Strickland is a 73 y.o. female presenting on 11/28/2023 for Annual Exam   Atrial fibrillation:  Reviewed OV from  cardiology 11/14/2023 Had afib ablation. Followed by cardiology on tikosyn  and Cardizem  CD 120 mg p.o. daily for rate control.   On [name]  150 mg p.o. twice daily for anticoagulation.  Feeling better overall. Less fatigue.  Recent COVID infection in August now resolved... except mild cough and fatigue.  Hypertension:   Good control on  diltiazem  . BP Readings from Last 3 Encounters:  11/28/23 132/74  11/14/23 138/79  10/23/23 (!) 173/69  Using medication without problems or lightheadedness:  none Chest pain with exertion: none Edema: none Short of breath: none Average home BPs: not checking recently Other issues:  Diabetes: Due for re-eval on Glucotrol  XL 10 mg p.o. daily On Mounjaro  2.5 mg weekly.. was off for 2 weeks, now back on.  Tolerating well..  no current constipation.  SE to metformin  in past. Lab Results  Component Value Date   HGBA1C 6.5 (H) 07/20/2023  Using medications without difficulties: Hypoglycemic episodes: none Hyperglycemic episodes:rare Feet problems:  no ulcers. Blood Sugars averaging: FBS 150-170 eye exam within last year: planning to schedule  Elevated Cholesterol:  On atorvastatin  20 mg daily.SABRA on higher dose for 2-3 weeks.  Due for re-eval. Using medications without problems: Muscle aches:  Diet compliance:  working on lower carb diet.SABRA occ skipping meals and decreasing portion size. Exercise: minimal Other complaints:      Wt Readings from Last 3 Encounters:  11/28/23 248 lb (112.5 kg)   11/14/23 249 lb (112.9 kg)  10/15/23 252 lb 9.6 oz (114.6 kg)     Relevant past medical, surgical, family and social history reviewed and updated as indicated. Interim medical history since our last visit reviewed. Allergies and medications reviewed and updated. Outpatient Medications Prior to Visit  Medication Sig Dispense Refill   acetaminophen  (TYLENOL ) 650 MG CR tablet Take 650 mg by mouth every 8 (eight) hours as needed for pain.     atorvastatin  (LIPITOR) 20 MG tablet Take 1 tablet (20 mg total) by mouth daily. 90 tablet 1   bismuth subsalicylate (PEPTO BISMOL) 262 MG/15ML suspension Take 30 mLs by mouth every 6 (six) hours as needed for indigestion or diarrhea or loose stools.     cyclobenzaprine  (FLEXERIL ) 10 MG tablet Take 0.5-1 tablets (5-10 mg total) by mouth at bedtime as needed for muscle spasms. 30 tablet 0   dabigatran  ([name] ) 150 MG CAPS capsule Take 1 capsule (150 mg total) by mouth 2 (two) times daily. 180 capsule 3   diltiazem  (CARDIZEM  CD) 120 MG 24 hr capsule Take 1 capsule (120 mg total) by mouth daily. 30 capsule 6   diltiazem  (CARDIZEM ) 30 MG tablet Take 1 tablet (30 mg total) by mouth every 6 (six) hours as needed (for persistant heart rate greater than 120). 120 tablet 1   dofetilide  (TIKOSYN ) 250 MCG capsule Take 1 capsule (250 mcg total) by mouth 2 (two) times daily.  180 capsule 2   glipiZIDE  (GLUCOTROL  XL) 5 MG 24 hr tablet Take 2 tablets (10 mg total) by mouth daily. 180 tablet 1   tirzepatide  (MOUNJARO ) 2.5 MG/0.5ML Pen Inject 2.5 mg into the skin once a week. 2 mL 11   traMADol  (ULTRAM ) 50 MG tablet Take 1 tablet (50 mg total) by mouth every 12 (twelve) hours as needed. 30 tablet 0   No facility-administered medications prior to visit.      Per HPI unless specifically indicated in ROS section below Review of Systems  Constitutional:  Negative for fatigue and fever.  HENT:  Negative for congestion.   Eyes:  Negative for pain.  Respiratory:  Negative for  cough and shortness of breath.   Cardiovascular:  Negative for chest pain, palpitations and leg swelling.  Gastrointestinal:  Negative for abdominal pain.  Genitourinary:  Negative for dysuria and vaginal bleeding.  Musculoskeletal:  Negative for back pain.  Neurological:  Negative for syncope, light-headedness and headaches.  Psychiatric/Behavioral:  Negative for dysphoric mood.    Objective:  BP 132/74   Pulse 62   Temp 98.2 F (36.8 C) (Oral)   Ht 5' 6.5 (1.689 m)   Wt 248 lb (112.5 kg)   SpO2 98%   BMI 39.43 kg/m   Wt Readings from Last 3 Encounters:  11/28/23 248 lb (112.5 kg)  11/14/23 249 lb (112.9 kg)  10/15/23 252 lb 9.6 oz (114.6 kg)      Physical Exam Vitals and nursing note reviewed.  Constitutional:      General: She is not in acute distress.    Appearance: Normal appearance. She is well-developed. She is obese. She is not ill-appearing or toxic-appearing.  HENT:     Head: Normocephalic.     Right Ear: Hearing, tympanic membrane, ear canal and external ear normal.     Left Ear: Hearing, tympanic membrane, ear canal and external ear normal.     Nose: Nose normal.  Eyes:     General: Lids are normal. Lids are everted, no foreign bodies appreciated.     Conjunctiva/sclera: Conjunctivae normal.     Pupils: Pupils are equal, round, and reactive to light.  Neck:     Thyroid: No thyroid mass or thyromegaly.     Vascular: No carotid bruit.     Trachea: Trachea normal.  Cardiovascular:     Rate and Rhythm: Normal rate and regular rhythm.     Heart sounds: Normal heart sounds, S1 normal and S2 normal. No murmur heard.    No gallop.  Pulmonary:     Effort: Pulmonary effort is normal. No respiratory distress.     Breath sounds: Normal breath sounds. No wheezing, rhonchi or rales.  Abdominal:     General: Bowel sounds are normal. There is no distension or abdominal bruit.     Palpations: Abdomen is soft. There is no fluid wave or mass.     Tenderness: There is no  abdominal tenderness. There is no guarding or rebound.     Hernia: No hernia is present.  Musculoskeletal:     Cervical back: Normal range of motion and neck supple.  Lymphadenopathy:     Cervical: No cervical adenopathy.  Skin:    General: Skin is warm and dry.     Findings: No rash.  Neurological:     Mental Status: She is alert.     Cranial Nerves: No cranial nerve deficit.     Sensory: No sensory deficit.  Psychiatric:  Mood and Affect: Mood is not anxious or depressed.        Speech: Speech normal.        Behavior: Behavior normal. Behavior is cooperative.        Judgment: Judgment normal.     Diabetic foot exam: Normal inspection No skin breakdown No calluses  Normal DP pulses Normal sensation to light touch and monofilament Nails normal     Results for orders placed or performed during the hospital encounter of 11/14/23  Glucose, capillary   Collection Time: 11/14/23  8:19 AM  Result Value Ref Range   Glucose-Capillary 198 (H) 70 - 99 mg/dL   Comment 1 Notify RN    Comment 2 Document in Chart   POCT Activated clotting time   Collection Time: 11/14/23 11:04 AM  Result Value Ref Range   Activated Clotting Time 383 seconds  Glucose, capillary   Collection Time: 11/14/23 12:17 PM  Result Value Ref Range   Glucose-Capillary 212 (H) 70 - 99 mg/dL     COVID 19 screen:  No recent travel or known exposure to COVID19 The patient denies respiratory symptoms of COVID 19 at this time. The importance of social distancing was discussed today.   Assessment and Plan   The patient's preventative maintenance and recommended screening tests for an annual wellness exam were reviewed in full today. Brought up to date unless services declined.  Counselled on the importance of diet, exercise, and its role in overall health and mortality. The patient's FH and SH was reviewed, including their home life, tobacco status, and drug and alcohol status.   Uptodate  with  yearly eye exam Vaccines: Recommended Tdap, Shingrix series, pneumonia vaccine and high dose flu (only wants to do regualr dose, will do at work) Pap/DVE: Not indicated Mammo: 8/2/024 nml, repeat in 1-2 years Bone Density: Overdue, plan 2026 along with mammogram Colon: hemeoccult cards negative, due for repeat  Smoking Status:none ETOH/ drug ldz:wnwz/wnwz  Hep C: done  Problem List Items Addressed This Visit     Atrial fibrillation with RVR (HCC)   Chronic,  on tiokosyn.  [name]  for anticoagulation.  Followed by cardiology.  Recent ablation on November 14, 2023      Class 2 severe obesity due to excess calories with serious comorbidity and body mass index (BMI) of 38.0 to 38.9 in adult [facility])   Encouraged exercise, weight loss, healthy eating habits.       Essential hypertension   Stable, chronic.  Continue current medication.        Hyperlipidemia associated with type 2 diabetes mellitus (HCC)   Due for reevaluation On atorvastatin  20 mg daily      Type 2 diabetes mellitus with hyperglycemia, without long-term current use of insulin  (HCC)   Due for reevaluation      Relevant Orders   Microalbumin / creatinine urine ratio   Hemoglobin A1c   Lipid panel   Comprehensive metabolic panel with GFR   Other Visit Diagnoses       Routine general medical examination at a health care facility    -  Primary     Colon cancer screening       Relevant Orders   Fecal occult blood, imunochemical         Greig Ring, MD

## 2023-11-28 NOTE — Assessment & Plan Note (Signed)
 Chronic,  on tiokosyn.  [name]  for anticoagulation.  Followed by cardiology.  Recent ablation on November 14, 2023

## 2023-12-02 ENCOUNTER — Other Ambulatory Visit: Payer: Self-pay

## 2023-12-03 ENCOUNTER — Telehealth: Payer: Self-pay | Admitting: Family Medicine

## 2023-12-03 ENCOUNTER — Other Ambulatory Visit: Payer: Self-pay

## 2023-12-03 MED ORDER — TRAMADOL HCL 50 MG PO TABS
50.0000 mg | ORAL_TABLET | Freq: Two times a day (BID) | ORAL | 0 refills | Status: DC | PRN
  Filled 2023-12-03: qty 30, 15d supply, fill #0

## 2023-12-03 MED ORDER — CYCLOBENZAPRINE HCL 10 MG PO TABS
5.0000 mg | ORAL_TABLET | Freq: Every evening | ORAL | 0 refills | Status: AC | PRN
  Filled 2023-12-03: qty 30, 30d supply, fill #0

## 2023-12-03 NOTE — Telephone Encounter (Signed)
 Please  let patient know that refills sent to pharmacy as requested.  If symptoms not improving please make follow-up appointment for repeat evaluation.

## 2023-12-03 NOTE — Telephone Encounter (Signed)
 Left message for [name] Strickland that prescriptions were sent in as requested.. If symptoms are not improving I ask that she schedule appointment for evaluation.

## 2023-12-03 NOTE — Telephone Encounter (Signed)
 Patient reached out to office. States she woke up this morning with sciatica on left side. States zinging down left leg to back of thigh just past knee. Denies any fall all or injury. No bowel or bladder incontinence. Pain is consistent but not continuous. Pain is 8/10 and is sharp and zinging. She has tried heat and otc meds with no improvement. She would like refill of tramadol  and flexeril  sent to [facility] pharmacy.

## 2023-12-09 ENCOUNTER — Other Ambulatory Visit (INDEPENDENT_AMBULATORY_CARE_PROVIDER_SITE_OTHER): Payer: Self-pay | Admitting: Radiology

## 2023-12-09 DIAGNOSIS — Z1211 Encounter for screening for malignant neoplasm of colon: Secondary | ICD-10-CM

## 2023-12-10 MED FILL — Diltiazem HCl Coated Beads Cap ER 24HR 120 MG: ORAL | 30 days supply | Qty: 30 | Fill #4 | Status: AC

## 2023-12-11 LAB — FECAL OCCULT BLOOD, IMMUNOCHEMICAL: Fecal Occult Bld: POSITIVE — AB

## 2023-12-22 ENCOUNTER — Telehealth: Payer: Self-pay

## 2023-12-22 ENCOUNTER — Other Ambulatory Visit: Payer: Self-pay

## 2023-12-22 DIAGNOSIS — R195 Other fecal abnormalities: Secondary | ICD-10-CM

## 2023-12-22 DIAGNOSIS — Z1211 Encounter for screening for malignant neoplasm of colon: Secondary | ICD-10-CM

## 2023-12-22 MED ORDER — NA SULFATE-K SULFATE-MG SULF 17.5-3.13-1.6 GM/177ML PO SOLN
1.0000 | Freq: Once | ORAL | 0 refills | Status: AC
  Filled 2023-12-22: qty 354, 1d supply, fill #0

## 2023-12-22 NOTE — Telephone Encounter (Signed)
 Gastroenterology Pre-Procedure Review  Request Date: 02/02/24 Requesting Physician: Dr. Jinny  PATIENT REVIEW QUESTIONS: The patient responded to the following health history questions as indicated:    1. Are you having any GI issues? Occasional constipation, 1st colonoscopy. Pt stated positive stool test 2. Do you have a personal history of Polyps? no 3. Do you have a family history of Colon Cancer or Polyps? no 4. Diabetes Mellitus? yes (takes Mounjaro  has been advised to stop 7 days prior to procedure, also takes Glipizide  has been advised to stop 1 day prior to procedure) 5. Joint replacements in the past 12 months?no 6. Major health problems in the past 3 months?no 7. Any artificial heart valves, MVP, or defibrillator?no 8. Cardiac history? Yes Afib Clearance sent to [facility]    MEDICATIONS & ALLERGIES:    Patient reports the following regarding taking any anticoagulation/antiplatelet therapy:   Plavix, Coumadin, Eliquis , Xarelto , Lovenox, [name] , Brilinta, or Effient? Yes pradaxa  clearance sent to [facility] cared Aspirin? no  Patient confirms/reports the following medications:  Current Outpatient Medications  Medication Sig Dispense Refill   acetaminophen  (TYLENOL ) 650 MG CR tablet Take 650 mg by mouth every 8 (eight) hours as needed for pain.     atorvastatin  (LIPITOR) 20 MG tablet Take 1 tablet (20 mg total) by mouth daily. 90 tablet 1   bismuth subsalicylate (PEPTO BISMOL) 262 MG/15ML suspension Take 30 mLs by mouth every 6 (six) hours as needed for indigestion or diarrhea or loose stools.     cyclobenzaprine  (FLEXERIL ) 10 MG tablet Take 0.5-1 tablets (5-10 mg total) by mouth at bedtime as needed for muscle spasms. 30 tablet 0   dabigatran  ([name] ) 150 MG CAPS capsule Take 1 capsule (150 mg total) by mouth 2 (two) times daily. 180 capsule 3   diltiazem  (CARDIZEM  CD) 120 MG 24 hr capsule Take 1 capsule (120 mg total) by mouth daily. 30 capsule 6   diltiazem  (CARDIZEM ) 30 MG tablet  Take 1 tablet (30 mg total) by mouth every 6 (six) hours as needed (for persistant heart rate greater than 120). 120 tablet 1   dofetilide  (TIKOSYN ) 250 MCG capsule Take 1 capsule (250 mcg total) by mouth 2 (two) times daily. 180 capsule 2   glipiZIDE  (GLUCOTROL  XL) 5 MG 24 hr tablet Take 2 tablets (10 mg total) by mouth daily. 180 tablet 1   tirzepatide  (MOUNJARO ) 2.5 MG/0.5ML Pen Inject 2.5 mg into the skin once a week. 2 mL 11   traMADol  (ULTRAM ) 50 MG tablet Take 1 tablet (50 mg total) by mouth every 12 (twelve) hours as needed. 30 tablet 0   No current facility-administered medications for this visit.    Patient confirms/reports the following allergies:  Allergies  Allergen Reactions   Shellfish-Derived Products Itching and Swelling        Fish Allergy Itching and Swelling   Iodine Other (See Comments)    Unknown childhood reaction to topical iodine   Contrast Media [Iodinated Contrast Media] Other (See Comments)    Unknown reaction to topical iodine as a child   Sulfonamide Derivatives Itching and Rash    No orders of the defined types were placed in this encounter.   AUTHORIZATION INFORMATION Primary Insurance: 1D#: Group #:  Secondary Insurance: 1D#: Group #:  SCHEDULE INFORMATION: Date: 02/02/24 Time: Location: [facility]

## 2023-12-22 NOTE — Progress Notes (Unsigned)
 Electrophysiology Clinic Note    Date:  12/23/2023  Patient ID:  [name] Strickland, [name] Strickland [birth year], MRN [id number] PCP:  Avelina Greig BRAVO, MD  Cardiologist:  None   Electrophysiologist:  Fonda Kitty, MD  Electrophysiology APP:  Ariyel Jeangilles, NP    Discussed the use of AI scribe software for clinical note transcription with the patient, who gave verbal consent to proceed.   Patient Profile    Chief Complaint: AF ablation follow-up  History of Present Illness: [name] Strickland is a 73 y.o. female with PMH notable for persis Afib, HTN, HLD, T2DM; seen today for Fonda Kitty, MD for routine electrophysiology follow-up s/p AF Ablation.  She is s/p AF ablation w PVI and CTI 2016 by Dr. Waddell.  She had recurrence of AFib on tikosyn , and is now s/p redo AF ablation w isolation of pulm veins and posterior wall on 11/14/2023 by Dr. Kitty.   On follow-up today, she had 2 day long episode of AFib last week with main symptom being dizziness. She confirmed heart rhythm using [employer]. Prior to ablation, she rarely had dizziness with episodes. The episode self-resolved and currently she feels very well. She did have a particularly stressful week last week and questions whether increased stress was to blame for her arrhythmia.  She continues to take tikosyn  q12h along with pradaxa . No bleeding concerns with pradaxa . She did have positive FOBT recently at PCP, has colonoscopy scheduled soon. She does not endorse visible blood during Bms.  She denies pain, tenderness or swelling to groins,  but has not assess them recently.      Arrhythmia/Device History Tikosyn  - loaded 2024    ROS:  Please see the history of present illness. All other systems are reviewed and otherwise negative.    Physical Exam    VS:  BP (!) 140/74 (BP Location: Left Arm, Patient Position: Sitting, Cuff Size: Large)   Pulse 78   Ht 5' 7 (1.702 m)   Wt 246 lb 9.6 oz (111.9 kg)   SpO2 97%   BMI 38.62 kg/m  BMI:  Body mass index is 38.62 kg/m.      Wt Readings from Last 3 Encounters:  12/23/23 246 lb 9.6 oz (111.9 kg)  11/28/23 248 lb (112.5 kg)  11/14/23 249 lb (112.9 kg)     GEN- The patient is well appearing, alert and oriented x 3 today. Poor dietician  Lungs- Clear to ausculation bilaterally, normal work of breathing.  Heart- Regular rate and rhythm, no murmurs, rubs or gallops Extremities- Trace peripheral edema, warm, dry. Bilateral groins c/d/I without bruising or tenderness   Studies Reviewed   Previous EP, cardiology notes.    EKG is ordered. Personal review of EKG from today shows:    EKG Interpretation Date/Time:  Tuesday December 23 2023 13:58:03 EDT Ventricular Rate:  78 PR Interval:  168 QRS Duration:  78 QT Interval:  408 QTC Calculation: 465 R Axis:   -24  Text Interpretation: Sinus rhythm with Premature supraventricular complexes Minimal voltage criteria for LVH, may be normal variant ( R in aVL ) Confirmed by Enis Riecke [phone]) on 12/23/2023 1:59:23 PM Also confirmed by Mamie Diiorio [phone])  on 12/23/2023 2:02:43 PM    07/2023 EKG - SR at 62bpm, low voltage; QTC 07/22/2023 EKG - SR at 63bpm, low voltage; QTC   Cardiac CTA, 10/23/2023 1. There is normal pulmonary vein drainage into the left atrium.  2. The left atrial appendage is large chicken  wing / broccoli type with a single lobe and ostial size 22 x 15 mm and length 41 mm. There is no thrombus in the left atrial appendage, verified on PV delay images. 3. The esophagus runs near the ostium of the LIPV. 4. Calcium  score: Coronary calcium  score of 1301. This was 98th percentile for age-, race-, and sex-matched controls (MESA). Left main and 3 vessel coronary calcification was noted. 5. Aorta: Borderline dilated at 38 mm, level of the main PA bifurcation. Aortic atherosclerosis. 6. Mild aortic valve calcification. 7. Dilated main pulmonary artery to 31 mm, suggestive of possible pulmonary  hypertension.  TTE, 07/21/2023  1. Left ventricular ejection fraction, by estimation, is 55 to 60%. The left ventricle has normal function. The left ventricle has no regional wall motion abnormalities. There is mild left ventricular hypertrophy. Left ventricular diastolic parameters are indeterminate.   2. Right ventricular systolic function is low normal. The right ventricular size is normal.   3. The mitral valve is normal in structure. Trivial mitral valve regurgitation.   4. The aortic valve is tricuspid. There is moderate calcification of the aortic valve. There is moderate thickening of the aortic valve. Aortic valve regurgitation is not visualized. Aortic valve sclerosis/calcification is present, without any evidence  of aortic stenosis.   5. There is mild dilatation of the ascending aorta, measuring 39 mm.   6. The inferior vena cava is normal in size with <50% respiratory variability, suggesting right atrial pressure of 8 mmHg.   Assessment and Plan     #) persis Afib #) tikosyn  monitoring S/p AF ablation 2016, s/p redo AF ablation 10/2023 Overall, maintaining sinus rhythm.  She did have a 2 day long AF episode last week, the only AF episode since ablation, ?associated with increased stress QTC stable on tikosyn  BID, will continue Recent BMP stable  #) Hypercoag d/t persis afib CHA2DS2-VASc Score = at least 5 [CHF History: 0, HTN History: 1, Diabetes History: 1, Stroke History: 0, Vascular Disease History: 1, Age Score: 1, Gender Score: 1].  Therefore, the patient's annual risk of stroke is 7.2 %.    Stroke ppx - 150mg  pradaxa  BID, appropriately dosed No bleeding concerns, though did have positive FOBT test recently  #) pre-op evaluation for colonoscopy OK to hold eliquis  for procedure once 3 months post-ablation, 11/15 or later Overall maintaining sinus rhythm RCRI = 0, indicating a 0.4% of perioperative major cardiac event       Current medicines are reviewed at  length with the patient today.   The patient does not have concerns regarding her medicines.  The following changes were made today:  none  Labs/ tests ordered today include:  Orders Placed This Encounter  Procedures   EKG 12-Lead     Disposition: Follow up with Dr. Kennyth or EP APP in 2 months    Signed, Nanako Stopher, NP  12/23/23  2:57 PM  Electrophysiology [facility]

## 2023-12-23 ENCOUNTER — Telehealth: Payer: Self-pay

## 2023-12-23 ENCOUNTER — Ambulatory Visit: Attending: Cardiology | Admitting: Cardiology

## 2023-12-23 VITALS — BP 140/74 | HR 78 | Ht 67.0 in | Wt 246.6 lb

## 2023-12-23 DIAGNOSIS — I4819 Other persistent atrial fibrillation: Secondary | ICD-10-CM

## 2023-12-23 DIAGNOSIS — D6869 Other thrombophilia: Secondary | ICD-10-CM | POA: Diagnosis not present

## 2023-12-23 DIAGNOSIS — Z79899 Other long term (current) drug therapy: Secondary | ICD-10-CM

## 2023-12-23 DIAGNOSIS — Z5181 Encounter for therapeutic drug level monitoring: Secondary | ICD-10-CM

## 2023-12-23 NOTE — Patient Instructions (Addendum)
 Medication Instructions:  Your physician recommends that you continue on your current medications as directed. Please refer to the Current Medication list given to you today.   *If you need a refill on your cardiac medications before your next appointment, please call your pharmacy*  Lab Work: No labs ordered today  If you have labs (blood work) drawn today and your tests are completely normal, you will receive your results only by: MyChart Message (if you have MyChart) OR A paper copy in the mail If you have any lab test that is abnormal or we need to change your treatment, we will call you to review the results.  Testing/Procedures: No test ordered today   Follow-Up: At [facility], you and your health needs are our priority.  As part of our continuing mission to provide you with exceptional heart care, our providers are all part of one team.  This team includes your primary Cardiologist (physician) and Advanced Practice Providers or APPs (Physician Assistants and Nurse Practitioners) who all work together to provide you with the care you need, when you need it.

## 2023-12-23 NOTE — Telephone Encounter (Signed)
 Pt contacted office to request colonoscopy reschedule from 02/02/24 with Dr. Jinny due to not being able to stop her [name]  medication at this time. Her cardiologist advised that she could stop it after 02/14/24 but not anytime sooner.  Colonoscopy has been canceled for 02/02/24 with Dr. Jinny. Kayla at [facility] has been notified.  Pt to be called back once scope schedule opens up for January at [facility] or if we have a cancellation at [facility] after 02/14/24.  Thanks,  [place], CMA

## 2023-12-24 ENCOUNTER — Telehealth: Payer: Self-pay | Admitting: Family Medicine

## 2023-12-24 DIAGNOSIS — R195 Other fecal abnormalities: Secondary | ICD-10-CM

## 2023-12-24 NOTE — Addendum Note (Signed)
 Addended by: AVELINA NO E on: 12/24/2023 11:21 AM   Modules accepted: Orders

## 2023-12-24 NOTE — Telephone Encounter (Signed)
 I will reach out to [facility] to see if they are able to schedule before end of the year.   She will likely have to have a New Patient Establish Visit/Consult prior to scheduling since this would basically be a transfer of care.

## 2023-12-24 NOTE — Telephone Encounter (Signed)
 Patient states that she is currently  scheduled for her November 3 for colonoscopy with Dr. Jinny but given she had an ablation she has to be a certain distance from the ablation so cannot actually have colonoscopy until November 15. She would like to do the colonoscopy before for the end of the year given insurance. Patient Dr. Jinny will not be able to schedule her by the end of the year. She request change to colonoscopy somewhere in [place] to see if they can do it before the end of the year.

## 2023-12-24 NOTE — Telephone Encounter (Signed)
 Please place a new referral to GI - [facility]

## 2023-12-24 NOTE — Telephone Encounter (Signed)
 Patient is scheduled with [facility] - Alan Coombs, PA on 12/26/23  for consult. They will discuss scheduling colonoscopy at that time.   Patient is aware and agreed to this appt date/time.   Nothing further needed.

## 2023-12-24 NOTE — Telephone Encounter (Signed)
 [name] Strickland is asking that her referral for her colonoscopy be change to [place] instead of [place] due to [facility] can't get her in until first of the year.

## 2023-12-25 NOTE — Progress Notes (Unsigned)
 12/26/2023 [name] Strickland [id number] February 20, 1951  Referring provider: Avelina Greig BRAVO, MD Primary GI doctor: Dr. Charlanne  ASSESSMENT AND PLAN:  Positive FOBT No family history of colon cancer, has never had a colonoscopy prior but has had negative FOBT yearly No overt GI bleeding, no anemia Some more constipation over the last year with mounjaro  Likely due to hemorrhoids and constipation with mounjaro , add on miralax  1/2 capful daily, schedule for colonoscopy at [facility]  We have discussed the risks of bleeding, infection, perforation, medication reactions, and remote risk of death associated with colonoscopy. All questions were answered and the patient acknowledges these risk and wishes to proceed.  A-fib 07/20/2023 echo EF 55 to 60% no aortic stenosis Status post ablation 11/14/2023 On pradaxa  and follows with Dr. Fonda Kitty, per note with cardiology 12/23/2023 had preop evaluation for colonoscopy and okay to hold 3 months post ablation, gave permission to hold 2 days however patient is on pradaxa  so need 5 day hold, will still send for permission.  Hold [name]  for 5 days before procedure will instruct when and how to resume after procedure.  Patient understands that there is a low but real risk of cardiovascular event such as heart attack, stroke, or embolism /  thrombosis, or ischemia while off Eliquis   The patient consents to proceed.   CAD Coronary calcium  score 1300 left main and 3 vessel coronary calcification noted Aortic atherosclerosis 2017 low risk stress test No chest pain, no SOB  Type 2 diabetes On mounjaro  x 1 year, has had more constipation Discussed GLP1 with the patient, mechanism of action. Gastroparesis diet given to the patient.  Patient should be instructed to hold this medications if dose falls within 7 days of endoscopic procedure, due to increased risk of retained gastric contents.  Morbid obesity  Body mass index is 38.41 kg/m.  -Patient has been advised  to make an attempt to improve diet and exercise patterns to aid in weight loss. -Recommended diet heavy in fruits and veggies and low in animal meats, cheeses, and dairy products, appropriate calorie intake   Patient Care Team: Avelina Greig BRAVO, MD as PCP - General (Family Medicine) Kitty Fonda, MD as PCP - Electrophysiology (Clinical Cardiac Electrophysiology) Riddle, Suzann, NP as Nurse Practitioner (Clinical Cardiac Electrophysiology)  HISTORY OF PRESENT ILLNESS: 73 y.o. female with a past medical history listed below presents for evaluation of colonoscopy.   Patient had colonoscopy scheduled with Dr. Jinny 02/02/2024 however this was canceled.  Patient is on [name]  and cardiologist advised she could not stop her [name]  any sooner than 02/14/2024 due to ablation done 11/14/2023  Discussed the use of AI scribe software for clinical note transcription with the patient, who gave verbal consent to proceed.  History of Present Illness   [name] Strickland is a 73 year old female who presents for a pre-colonoscopy consultation. She was referred by Dr. Nathen for a colonoscopy after a positive fecal occult blood test.  She was scheduled for a colonoscopy with Dr. Edith, but it was canceled due to her recent cardiac ablation. She is eager to have the colonoscopy between November 15th and the end of the year, as she has met her out-of-pocket expenses for the year.  She has a history of annual fecal occult blood tests, which have been negative every year. She has no symptoms such as melena or hematochezia. She does report constipation and some changes in her usual bowel routine, which she attributes to Mounjaro . She experiences constipation,  which she attributes to Mounjaro , a medication she has been on for nearly a year. She manages constipation with increased water intake and a diet rich in fruits and vegetables, avoiding medications due to her Tikosyn  regimen.  She has a history of atrial fibrillation  since 2012, which is stress-induced. She underwent a cardiac ablation and was previously on Plaquenide for 12 years before switching to Tikosyn  last year. She experiences no chest pain or dyspnea and maintains an active lifestyle, working full-time.  She has a family history of heart disease, with both parents having had myocardial infarctions. She underwent a stress test in 2017 and has an elevated coronary calcium  score. She is on Mounjaro  for diabetes management, which she started about a year ago. Her A1c levels have fluctuated, and she is currently on a 2.5 mg dose.  No upper GI symptoms such as dysphagia, nausea, or vomiting, except for occasional GERD, which she manages with Pepto Bismol.        She  reports that she has never smoked. She has never used smokeless tobacco. She reports that she does not drink alcohol and does not use drugs.  RELEVANT GI HISTORY, IMAGING AND LABS: Results   RADIOLOGY Coronary artery calcium  score: 98      CBC    Component Value Date/Time   WBC 7.2 10/15/2023 1226   RBC 5.00 10/15/2023 1226   HGB 13.5 10/15/2023 1226   HGB 13.3 11/29/2022 1020   HCT 41.2 10/15/2023 1226   HCT 40.8 11/29/2022 1020   PLT 312.0 10/15/2023 1226   PLT 360 11/29/2022 1020   MCV 82.4 10/15/2023 1226   MCV 84 11/29/2022 1020   MCV 83 07/02/2014 0526   MCH 27.4 07/21/2023 0529   MCHC 32.8 10/15/2023 1226   RDW 14.7 10/15/2023 1226   RDW 13.2 11/29/2022 1020   RDW 14.5 07/02/2014 0526   LYMPHSABS 1.8 10/15/2023 1226   MONOABS 0.5 10/15/2023 1226   EOSABS 0.4 10/15/2023 1226   BASOSABS 0.1 10/15/2023 1226   Recent Labs    07/20/23 2109 07/21/23 0529 10/15/23 1226  HGB 16.4* 15.2* 13.5    CMP     Component Value Date/Time   NA 139 11/28/2023 1223   NA 141 04/09/2023 1613   NA 137 07/02/2014 0526   K 4.2 11/28/2023 1223   K 3.8 07/02/2014 0526   CL 102 11/28/2023 1223   CL 105 07/02/2014 0526   CO2 27 11/28/2023 1223   CO2 26 07/02/2014 0526    GLUCOSE 162 (H) 11/28/2023 1223   GLUCOSE 175 (H) 07/02/2014 0526   BUN 11 11/28/2023 1223   BUN 11 04/09/2023 1613   BUN 12 07/02/2014 0526   CREATININE 0.74 11/28/2023 1223   CREATININE 0.86 07/02/2014 0526   CALCIUM  9.1 11/28/2023 1223   CALCIUM  8.9 07/02/2014 0526   PROT 7.5 11/28/2023 1223   PROT 7.0 11/29/2022 1020   ALBUMIN 4.5 11/28/2023 1223   ALBUMIN 4.1 11/29/2022 1020   AST 18 11/28/2023 1223   ALT 14 11/28/2023 1223   ALKPHOS 68 11/28/2023 1223   BILITOT 0.8 11/28/2023 1223   BILITOT 0.5 11/29/2022 1020   GFRNONAA >60 07/22/2023 0903   GFRNONAA >60 07/02/2014 0526   GFRAA >90 07/27/2014 0346   GFRAA >60 07/02/2014 0526      Latest Ref Rng & Units 11/28/2023   12:23 PM 07/20/2023    9:09 PM 11/29/2022   10:20 AM  Hepatic Function  Total Protein 6.0 - 8.3 g/dL 7.5  8.1  7.0   Albumin 3.5 - 5.2 g/dL 4.5  4.2  4.1   AST 0 - 37 U/L 18  20  13    ALT 0 - 35 U/L 14  14  9    Alk Phosphatase 39 - 117 U/L 68  54  62   Total Bilirubin 0.2 - 1.2 mg/dL 0.8  0.6  0.5       Current Medications:   Current Outpatient Medications (Endocrine & Metabolic):    glipiZIDE  (GLUCOTROL  XL) 5 MG 24 hr tablet, Take 2 tablets (10 mg total) by mouth daily.   tirzepatide  (MOUNJARO ) 2.5 MG/0.5ML Pen, Inject 2.5 mg into the skin once a week.  Current Outpatient Medications (Cardiovascular):    atorvastatin  (LIPITOR) 20 MG tablet, Take 1 tablet (20 mg total) by mouth daily.   diltiazem  (CARDIZEM  CD) 120 MG 24 hr capsule, Take 1 capsule (120 mg total) by mouth daily.   diltiazem  (CARDIZEM ) 30 MG tablet, Take 1 tablet (30 mg total) by mouth every 6 (six) hours as needed (for persistant heart rate greater than 120).   dofetilide  (TIKOSYN ) 250 MCG capsule, Take 1 capsule (250 mcg total) by mouth 2 (two) times daily.   Current Outpatient Medications (Analgesics):    acetaminophen  (TYLENOL ) 650 MG CR tablet, Take 650 mg by mouth every 8 (eight) hours as needed for pain.   traMADol  (ULTRAM ) 50  MG tablet, Take 1 tablet (50 mg total) by mouth every 12 (twelve) hours as needed.  Current Outpatient Medications (Hematological):    dabigatran  ([name] ) 150 MG CAPS capsule, Take 1 capsule (150 mg total) by mouth 2 (two) times daily.  Current Outpatient Medications (Other):    bismuth subsalicylate (PEPTO BISMOL) 262 MG/15ML suspension, Take 30 mLs by mouth every 6 (six) hours as needed for indigestion or diarrhea or loose stools.   cyclobenzaprine  (FLEXERIL ) 10 MG tablet, Take 0.5-1 tablets (5-10 mg total) by mouth at bedtime as needed for muscle spasms.  Medical History:  Past Medical History:  Diagnosis Date   Diabetes mellitus    pt is unaware and is on no medicine for this (A1C 5.9 2012)   Dyslipidemia    Hypertension    Overweight    Paroxysmal atrial fibrillation (HCC)    chads2vasc score of at least 2   Snoring    cancelled prior sleep study due to costs   Typical atrial flutter (HCC)    Allergies:  Allergies  Allergen Reactions   Shellfish-Derived Products Itching and Swelling        Fish Allergy Itching and Swelling   Iodine Other (See Comments)    Unknown childhood reaction to topical iodine   Contrast Media [Iodinated Contrast Media] Other (See Comments)    Unknown reaction to topical iodine as a child   Sulfonamide Derivatives Itching and Rash     Surgical History:  She  has a past surgical history that includes Appendectomy; Tonsillectomy and adenoidectomy; Knee surgery; Replacement total knee; Breast lumpectomy; Cardioversion (N/A, 07/03/2014); atrial fibrillation ablation (N/A, 07/26/2014); TEE without cardioversion (N/A, 07/26/2014); Cardioversion (N/A, 07/22/2023); and ATRIAL FIBRILLATION ABLATION (N/A, 11/14/2023). Family History:  Her family history includes Heart attack in her father and mother; Heart disease in her father; Stroke in her father.  REVIEW OF SYSTEMS  : All other systems reviewed and negative except where noted in the History of Present  Illness.  PHYSICAL EXAM: BP 130/82   Pulse 68   Ht 5' 7 (1.702 m)   Wt 245 lb 4 oz (  111.2 kg)   BMI 38.41 kg/m  Physical Exam   GENERAL APPEARANCE: obese, in no apparent distress HEENT: No cervical lymphadenopathy, unremarkable thyroid, sclerae anicteric, conjunctiva pink, poor dentition RESPIRATORY: Respiratory effort normal, BS equal bilateral without rales, rhonchi, wheezing CARDIO: RRR with no MRGs, peripheral pulses intact ABDOMEN: Soft, non distended, active bowel sounds in all 4 quadrants, no tenderness to palpation, no rebound, no mass appreciated RECTAL: declines MUSCULOSKELETAL: Full ROM, normal gait, without edema, able to get on table with minimal assistance SKIN: Dry, intact without rashes or lesions. No jaundice. NEURO: Alert, oriented, no focal deficits PSYCH: Cooperative, normal mood and affect.      Alan JONELLE Coombs, PA-C 11:35 AM

## 2023-12-26 ENCOUNTER — Other Ambulatory Visit: Payer: Self-pay

## 2023-12-26 ENCOUNTER — Ambulatory Visit: Admitting: Physician Assistant

## 2023-12-26 ENCOUNTER — Encounter: Payer: Self-pay | Admitting: Physician Assistant

## 2023-12-26 ENCOUNTER — Telehealth: Payer: Self-pay

## 2023-12-26 VITALS — BP 130/82 | HR 68 | Ht 67.0 in | Wt 245.2 lb

## 2023-12-26 DIAGNOSIS — K5900 Constipation, unspecified: Secondary | ICD-10-CM | POA: Diagnosis not present

## 2023-12-26 DIAGNOSIS — R195 Other fecal abnormalities: Secondary | ICD-10-CM | POA: Diagnosis not present

## 2023-12-26 DIAGNOSIS — Z6838 Body mass index (BMI) 38.0-38.9, adult: Secondary | ICD-10-CM

## 2023-12-26 DIAGNOSIS — E119 Type 2 diabetes mellitus without complications: Secondary | ICD-10-CM

## 2023-12-26 DIAGNOSIS — Z7984 Long term (current) use of oral hypoglycemic drugs: Secondary | ICD-10-CM | POA: Diagnosis not present

## 2023-12-26 DIAGNOSIS — I4891 Unspecified atrial fibrillation: Secondary | ICD-10-CM

## 2023-12-26 DIAGNOSIS — E66812 Obesity, class 2: Secondary | ICD-10-CM

## 2023-12-26 DIAGNOSIS — K5904 Chronic idiopathic constipation: Secondary | ICD-10-CM

## 2023-12-26 DIAGNOSIS — E1165 Type 2 diabetes mellitus with hyperglycemia: Secondary | ICD-10-CM

## 2023-12-26 MED ORDER — NA SULFATE-K SULFATE-MG SULF 17.5-3.13-1.6 GM/177ML PO SOLN
1.0000 | ORAL | 0 refills | Status: DC
  Filled 2023-12-26 – 2024-02-09 (×2): qty 354, 1d supply, fill #0

## 2023-12-26 NOTE — Telephone Encounter (Signed)
 Pharmacy please advise on holding [name]  prior to colonoscopy scheduled for 02/20/2024. Last labs (CMET) 11/28/2023; (CBC) 08/05/2023. Thank you.

## 2023-12-26 NOTE — Telephone Encounter (Signed)
   Patient Name: [name] Strickland  DOB: [birth year] MRN: [id number]  Primary Cardiologist: None  Clinical pharmacists have reviewed the patient's past medical history, labs, and current medications as part of preoperative protocol coverage. The following recommendations have been made:  [facility] Pre-operative Risk Assessment     Request for surgical clearance:     Endoscopy Procedure  What type of surgery is being performed?    Colonoscopy  When is this surgery scheduled?     02-20-24  What type of clearance is required ?   Pharmacy  Are there any medications that need to be held prior to surgery and how long? [name]  x 5 days  Practice name and name of physician performing surgery?      [facility]  What is your office phone and fax number?      Phone- [phone]  Fax- [phone]  Anesthesia type (None, local, MAC, general) ?       MAC    I will route this recommendation to the requesting party via Epic fax function and remove from pre-op pool.  Please call with questions.  Lamarr Satterfield, NP 12/26/2023, 2:07 PM

## 2023-12-26 NOTE — Patient Instructions (Addendum)
 _______________________________________________________  If your blood pressure at your visit was 140/90 or greater, please contact your primary care physician to follow up on this.  _______________________________________________________  If you are age 71 or older, your body mass index should be between 23-30. Your Body mass index is 38.41 kg/m. If this is out of the aforementioned range listed, please consider follow up with your Primary Care Provider.  If you are age 81 or younger, your body mass index should be between 19-25. Your Body mass index is 38.41 kg/m. If this is out of the aformentioned range listed, please consider follow up with your Primary Care Provider.   ________________________________________________________  The [facility] providers would like to encourage you to use MYCHART to communicate with providers for non-urgent requests or questions.  Due to long hold times on the telephone, sending your provider a message by [facility] may be a faster and more efficient way to get a response.  Please allow 48 business hours for a response.  Please remember that this is for non-urgent requests.  _______________________________________________________  [facility] is using a team-based approach to care.  Your team is made up of your doctor and two to three APPS. Our APPS (Nurse Practitioners and Physician Assistants) work with your physician to ensure care continuity for you. They are fully qualified to address your health concerns and develop a treatment plan. They communicate directly with your gastroenterologist to care for you. Seeing the Advanced Practice Practitioners on your physician's team can help you by facilitating care more promptly, often allowing for earlier appointments, access to diagnostic testing, procedures, and other specialty referrals.   You have been scheduled for a colonoscopy. Please follow written instructions given to you at your visit today.    If you use inhalers (even only as needed), please bring them with you on the day of your procedure.  DO NOT TAKE 7 DAYS PRIOR TO TEST- Trulicity (dulaglutide) Ozempic, Wegovy (semaglutide) Mounjaro  (tirzepatide ) Bydureon Bcise (exanatide extended release)  DO NOT TAKE 1 DAY PRIOR TO YOUR TEST Rybelsus (semaglutide) Adlyxin (lixisenatide) Victoza (liraglutide) Byetta (exanatide) ___________________________________________________________________________  Due to recent changes in healthcare laws, you may see the results of your imaging and laboratory studies on MyChart before your provider has had a chance to review them.  We understand that in some cases there may be results that are confusing or concerning to you. Not all laboratory results come back in the same time frame and the provider may be waiting for multiple results in order to interpret others.  Please give us  48 hours in order for your provider to thoroughly review all the results before contacting the office for clarification of your results.   Toileting tips to help with your constipation - Drink at least 64-80 ounces of water/liquid per day. - Establish a time to try to move your bowels every day.  For many people, this is after a cup of coffee or after a meal such as breakfast. - Sit all of the way back on the toilet keeping your back fairly straight and while sitting up, try to rest the tops of your forearms on your upper thighs.   - Raising your feet with a step stool/squatty potty can be helpful to improve the angle that allows your stool to pass through the rectum. - Relax the rectum feeling it bulge toward the toilet water.  If you feel your rectum raising toward your body, you are contracting rather than relaxing. - Breathe in and slowly exhale. Belly  breath by expanding your belly towards your belly button. Keep belly expanded as you gently direct pressure down and back to the anus.  A low pitched GRRR sound can  assist with increasing intra-abdominal pressure.  (Can also trying to blow on a pinwheel and make it move, this helps with the same belly breathing) - Repeat 3-4 times. If unsuccessful, contract the pelvic floor to restore normal tone and get off the toilet.  Avoid excessive straining. - To reduce excessive wiping by teaching your anus to normally contract, place hands on outer aspect of knees and resist knee movement outward.  Hold 5-10 second then place hands just inside of knees and resist inward movement of knees.  Hold 5 seconds.  Repeat a few times each way.  Go to the ER if unable to pass gas, severe AB pain, unable to hold down food, any shortness of breath of chest pain.  Miralax  is an osmotic laxative.  It only brings more water into the stool.  This is safe to take daily.  Can take up to 17 gram of miralax  twice a day.  Mix with juice or coffee.  Start 1 capful at night for 3-4 days and reassess your response in 3-4 days.  You can increase and decrease the dose based on your response.  Remember, it can take up to 3-4 days to take effect OR for the effects to wear off.   I often pair this with benefiber in the morning to help assure the stool is not too loose.   Gastroparesis Gastroparesis is a condition in which food takes longer than normal to empty from the stomach.  This condition is also known as delayed gastric emptying. It is usually a long-term (chronic) condition.  What are the signs or symptoms? Symptoms of this condition include: Feeling full after eating very little or a loss of appetite. Nausea, vomiting, or heartburn. Bloating of your abdomen. Inconsistent blood sugar (glucose) levels on blood tests. Unexplained weight loss. Acid from the stomach coming up into the esophagus (gastroesophageal reflux). Sudden tightening (spasm) of the stomach, which can be painful. Symptoms may come and go. Some people may not notice any symptoms.  What increases the risk? You  are more likely to develop this condition if: You have certain disorders or diseases. These may include: An endocrine disorder. An eating disorder. Amyloidosis. Scleroderma. Parkinson's disease. Multiple sclerosis. Cancer or infection of the stomach or the vagus nerve. You have had surgery on your stomach or vagus nerve. You take certain medicines. You are female.  Things you can do: Please do small frequent meals like 4-6 meals a day.  Eat and drink liquids at separate times.  Avoid high fiber foods, cook your vegetables, avoid high fat food.  Suggest spreading protein throughout the day (greek yogurt, glucerna, soft meat, milk, eggs) Choose soft foods that you can mash with a fork When you are more symptomatic, change to pureed foods foods and liquids.  Consider reading Living well with Gastroparesis by [facility] Check out this link to a diet online [email or web address].fr  Thank you for entrusting me with your care and choosing [facility].  Alan Coombs, PA-C

## 2023-12-26 NOTE — Telephone Encounter (Addendum)
 Patient with diagnosis of Afib on [name]  for anticoagulation.    What type of surgery is being performed?    Colonoscopy  When is this surgery scheduled?     02-20-24    CHA2DS2-VASc Score = 4   This indicates a 4.8% annual risk of stroke. The patient's score is based upon: CHF History: 0 HTN History: 1 Diabetes History: 1 Stroke History: 0 Vascular Disease History: 0 Age Score: 1 Gender Score: 1    CrCl 90 ml/min Platelet count 312 K  Patient has had an Afib ablation within the last 3 months (11/14/2023). Colonoscopy procedure is schedule at least 3 months from last ablation (02-20-2024)  Per office protocol, patient can hold [name]  for 2 days prior to procedure.   Patient will not need bridging with Lovenox (enoxaparin) around procedure.  **This guidance is not considered finalized until pre-operative APP has relayed final recommendations.**

## 2023-12-26 NOTE — Telephone Encounter (Addendum)
 Patient was seen on 12-23-23 and advised OK to hold eliquis  for procedure once 3 months post-ablation, 11/15 or later, but she is actually taking [name] .     [facility] Pre-operative Risk Assessment     Request for surgical clearance:     Endoscopy Procedure  What type of surgery is being performed?    Colonoscopy  When is this surgery scheduled?     02-20-24  What type of clearance is required ?   Pharmacy  Are there any medications that need to be held prior to surgery and how long? [name]  x 5 days  Practice name and name of physician performing surgery?      [facility]  What is your office phone and fax number?      Phone- [phone]  Fax- [phone]  Anesthesia type (None, local, MAC, general) ?       MAC   Please route your response to Nat SAUNDERS, CMA

## 2024-01-01 NOTE — Telephone Encounter (Signed)
Left message for patient to return call to further discuss. Will continue efforts.  

## 2024-01-02 NOTE — Telephone Encounter (Signed)
Left message for patient to return call to further discuss. Will continue efforts.  

## 2024-01-06 NOTE — Telephone Encounter (Signed)
 Patient advised that she has been given clearance to hold [name]  2 days prior to colonoscopy scheduled for 02-20-24.  Patient advised to take last dose of [name]  on 02-17-24, and she will be advised when to restart [name]  by Dr Charlanne after the procedure.  Patient agreed to plan and verbalized understanding.  No further questions.

## 2024-01-06 NOTE — Telephone Encounter (Signed)
Left message for patient to return call to further discuss. Will continue efforts.  

## 2024-01-09 MED FILL — Diltiazem HCl Coated Beads Cap ER 24HR 120 MG: ORAL | 30 days supply | Qty: 30 | Fill #5 | Status: AC

## 2024-01-23 MED FILL — Glipizide Tab ER 24HR 5 MG: ORAL | 90 days supply | Qty: 180 | Fill #1 | Status: CN

## 2024-01-26 ENCOUNTER — Other Ambulatory Visit: Payer: Self-pay

## 2024-01-26 ENCOUNTER — Other Ambulatory Visit: Payer: Self-pay | Admitting: Family Medicine

## 2024-01-26 MED ORDER — MOUNJARO 2.5 MG/0.5ML ~~LOC~~ SOAJ
2.5000 mg | SUBCUTANEOUS | 0 refills | Status: DC
  Filled 2024-01-26 – 2024-02-04 (×2): qty 2, 28d supply, fill #0
  Filled ????-??-??: fill #1

## 2024-02-02 ENCOUNTER — Ambulatory Visit: Admit: 2024-02-02 | Admitting: Gastroenterology

## 2024-02-02 SURGERY — COLONOSCOPY
Anesthesia: General

## 2024-02-04 ENCOUNTER — Other Ambulatory Visit: Payer: Self-pay

## 2024-02-04 MED FILL — Glipizide Tab ER 24HR 5 MG: ORAL | 90 days supply | Qty: 180 | Fill #1 | Status: AC

## 2024-02-05 ENCOUNTER — Other Ambulatory Visit: Payer: Self-pay

## 2024-02-09 ENCOUNTER — Other Ambulatory Visit: Payer: Self-pay

## 2024-02-10 MED FILL — Diltiazem HCl Coated Beads Cap ER 24HR 120 MG: ORAL | 30 days supply | Qty: 30 | Fill #6 | Status: AC

## 2024-02-15 NOTE — Progress Notes (Unsigned)
 Electrophysiology Clinic Note    Date:  02/16/2024  Patient ID:  [name] Strickland, [name] Strickland [birth year], MRN [id number] PCP:  Avelina Greig BRAVO, MD  Cardiologist:  None   Electrophysiologist:  Fonda Kitty, MD  Electrophysiology APP:  Lemon Sternberg, NP    Discussed the use of AI scribe software for clinical note transcription with the patient, who gave verbal consent to proceed.   Patient Profile    Chief Complaint: AF ablation follow-up  History of Present Illness: [name] Strickland is a 72 y.o. female with PMH notable for persis Afib, HTN, HLD, T2DM; seen today for Fonda Kitty, MD for routine electrophysiology follow-up s/p AF Ablation.  She is s/p AF ablation w PVI and CTI 2016 by Dr. Waddell.  She had recurrence of AFib on tikosyn , and is now s/p redo AF ablation w isolation of pulm veins and posterior wall on 11/14/2023 by Dr. Kitty.   I saw her for 1 month post-ablation appt where she had a 2-day long AF episode with dizziness. Tikosyn  continued.   On follow-up today, she had AFib episode this morning accompanied with lightheadedness. She took a PRN diltiazem  when episode continued for ~45 minutes, and went back to bed. Whens he woke up, she was in normal rhythm but had increased fatigue. Her HR during episode stayed < 120. She uses pulse ox to monitor her heart rate and rhythm.   She continues to take tikosyn  q12h without missing doses.  Continues to take pradaxa  BID without bleeding concerns.   She questions whether stress and anxiety are leading to ongoing AFib episodes. She has used PRN xanax  in the past for anxiety with good results.      Arrhythmia/Device History Tikosyn  - loaded 2024    ROS:  Please see the history of present illness. All other systems are reviewed and otherwise negative.    Physical Exam    VS:  BP 130/70 (BP Location: Left Arm, Patient Position: Sitting, Cuff Size: Normal)   Pulse (!) 57   Ht 5' 8 (1.727 m)   Wt 242 lb (109.8 kg)   SpO2 97%    BMI 36.80 kg/m  BMI: Body mass index is 36.8 kg/m.  Orthostatic VS for the past 24 hrs (Last 3 readings):  BP- Lying Pulse- Lying BP- Sitting Pulse- Sitting BP- Standing at 0 minutes Pulse- Standing at 0 minutes BP- Standing at 3 minutes Pulse- Standing at 3 minutes  02/16/24 1351 156/83 61 123/76 57 138/84 61 144/84 63     Wt Readings from Last 3 Encounters:  02/16/24 242 lb (109.8 kg)  12/26/23 245 lb 4 oz (111.2 kg)  12/23/23 246 lb 9.6 oz (111.9 kg)     GEN- The patient is well appearing, alert and oriented x 3 today. Lungs- Clear to ausculation bilaterally, normal work of breathing.  Heart- Regular rate and rhythm, no murmurs, rubs or gallops Extremities- Trace peripheral edema, warm, dry.   Studies Reviewed   Previous EP, cardiology notes.    EKG is ordered. Personal review of EKG from today shows:    EKG Interpretation Date/Time:  Monday February 16 2024 13:49:07 EST Ventricular Rate:  57 PR Interval:    QRS Duration:  92 QT Interval:  486 QTC Calculation: 473 R Axis:   -11  Text Interpretation: Junctional rhythm Confirmed by Lakaisha Danish [phone]) on 02/16/2024 1:54:20 PM    12/23/2023 EKG - SR w PAC, rate 78; QTC 07/2023 EKG - SR at 62bpm, low voltage;  QTC 07/22/2023 EKG - SR at 63bpm, low voltage; QTC   Cardiac CTA, 10/23/2023 1. There is normal pulmonary vein drainage into the left atrium.  2. The left atrial appendage is large chicken wing / broccoli type with a single lobe and ostial size 22 x 15 mm and length 41 mm. There is no thrombus in the left atrial appendage, verified on PV delay images. 3. The esophagus runs near the ostium of the LIPV. 4. Calcium  score: Coronary calcium  score of 1301. This was 98th percentile for age-, race-, and sex-matched controls (MESA). Left main and 3 vessel coronary calcification was noted. 5. Aorta: Borderline dilated at 38 mm, level of the main PA bifurcation. Aortic atherosclerosis. 6. Mild aortic valve  calcification. 7. Dilated main pulmonary artery to 31 mm, suggestive of possible pulmonary hypertension.  TTE, 07/21/2023  1. Left ventricular ejection fraction, by estimation, is 55 to 60%. The left ventricle has normal function. The left ventricle has no regional wall motion abnormalities. There is mild left ventricular hypertrophy. Left ventricular diastolic parameters are indeterminate.   2. Right ventricular systolic function is low normal. The right ventricular size is normal.   3. The mitral valve is normal in structure. Trivial mitral valve regurgitation.   4. The aortic valve is tricuspid. There is moderate calcification of the aortic valve. There is moderate thickening of the aortic valve. Aortic valve regurgitation is not visualized. Aortic valve sclerosis/calcification is present, without any evidence of aortic stenosis.   5. There is mild dilatation of the ascending aorta, measuring 39 mm.   6. The inferior vena cava is normal in size with <50% respiratory variability, suggesting right atrial pressure of 8 mmHg.   Assessment and Plan     #) persis Afib #) tikosyn  monitoring S/p AF ablation 2016, s/p redo AF ablation 10/2023 Continues to have paroxysmal Afib episodes  QTC stable on 250mcg tikosyn  BID, will continue We discussed transitioning from tikosyn  > amiodarone. She would prefer to work on stress and anxiety mgmt first, and then if AFib episodes continue will start amiodarone  #) Hypercoag d/t persis afib CHA2DS2-VASc Score = at least 4 [CHF History: 0, HTN History: 1, Diabetes History: 1, Stroke History: 0, Vascular Disease History: 0, Age Score: 1, Gender Score: 1].  Therefore, the patient's annual risk of stroke is 4.8 %.    Stroke ppx - 150mg  pradaxa  BID, appropriately dosed      Current medicines are reviewed at length with the patient today.   The patient does not have concerns regarding her medicines.  The following changes were made today:  none  Labs/ tests  ordered today include:  Orders Placed This Encounter  Procedures   EKG 12-Lead     Disposition: Follow up with Dr. Kennyth or EP APP in 2-3 months    Signed, Libby Goehring, NP  02/16/24  4:08 PM  Electrophysiology [facility]

## 2024-02-16 ENCOUNTER — Encounter: Payer: Self-pay | Admitting: Cardiology

## 2024-02-16 ENCOUNTER — Ambulatory Visit: Attending: Cardiology | Admitting: Cardiology

## 2024-02-16 ENCOUNTER — Telehealth: Payer: Self-pay | Admitting: Cardiology

## 2024-02-16 VITALS — BP 130/70 | HR 57 | Ht 68.0 in | Wt 242.0 lb

## 2024-02-16 DIAGNOSIS — Z5181 Encounter for therapeutic drug level monitoring: Secondary | ICD-10-CM | POA: Diagnosis not present

## 2024-02-16 DIAGNOSIS — I4819 Other persistent atrial fibrillation: Secondary | ICD-10-CM

## 2024-02-16 DIAGNOSIS — Z79899 Other long term (current) drug therapy: Secondary | ICD-10-CM

## 2024-02-16 DIAGNOSIS — D6869 Other thrombophilia: Secondary | ICD-10-CM

## 2024-02-16 NOTE — Patient Instructions (Signed)
 Medication Instructions:   Your physician recommends that you continue on your current medications as directed. Please refer to the Current Medication list given to you today.  *If you need a refill on your cardiac medications before your next appointment, please call your pharmacy*  Lab Work:  NONE  If you have labs (blood work) drawn today and your tests are completely normal, you will receive your results only by: MyChart Message (if you have MyChart) OR A paper copy in the mail If you have any lab test that is abnormal or we need to change your treatment, we will call you to review the results.  Testing/Procedures:  NONE  Follow-Up: At [facility], you and your health needs are our priority.  As part of our continuing mission to provide you with exceptional heart care, our providers are all part of one team.  This team includes your primary Cardiologist (physician) and Advanced Practice Providers or APPs (Physician Assistants and Nurse Practitioners) who all work together to provide you with the care you need, when you need it.  Your next appointment:   8 week(s)  Provider:    Fonda Kitty, MD  We recommend signing up for the patient portal called MyChart.  Sign up information is provided on this After Visit Summary.  MyChart is used to connect with patients for Virtual Visits (Telemedicine).  Patients are able to view lab/test results, encounter notes, upcoming appointments, etc.  Non-urgent messages can be sent to your provider as well.   To learn more about what you can do with MyChart, go to [email or web address].

## 2024-02-16 NOTE — Telephone Encounter (Signed)
 Patient c/o Palpitations: STAT if patient c/o lightheadedness, shortness of breath, or chest pain  How long have you had palpitations/irregular HR/ Afib? Are you having the symptoms now? Since a little bit before 7am   Are you currently experiencing lightheadedness, SOB or CP? Dizziness, did have a little bit of sob going to the bathroom, no CP   Do you have a history of afib (atrial fibrillation) or irregular heart rhythm? Yes  Have you checked your BP or HR? (document readings if available): Never got above 120   Are you experiencing any other symptoms? No   Has taken her extra medicine at 8:30am, hasn't kicked in yet. She has her 90 day post ablation follow up today at 1:30pm, she initially called in to reschedule it. I was urging patient to keep appointment as I know it could be of help to her, but she states she can't drive when dizzy like this in an AF episode.  Convinced patient to not cancel appointment right now for Suzann today, and to speak with a nurse before cancelling.   Call transferred to triage.

## 2024-02-16 NOTE — Telephone Encounter (Signed)
 Patient called and was put through on the STAT line; pt woke up to go to bathroom, felt palpitations, dizzy, and slight shortness of breath; pt states that she went into Afib, but heart rate never got above 120 bpm; pt took extra dose of Diltazem 120mg  at 8:30 am; while on phone pt's heart rate was 96; states that she is lying down;  She is scheduled for an appointment with Suzann Riddle, NP; pt states she will call me back directly if she decides that she cannot make her appointment.  Her daughter is working and she has no one to drive her if she remains dizzy.  Spoke with Suzann and she states she really needs to keep the appointment if possible.

## 2024-02-20 ENCOUNTER — Encounter: Payer: Self-pay | Admitting: Gastroenterology

## 2024-02-20 ENCOUNTER — Ambulatory Visit (AMBULATORY_SURGERY_CENTER): Admitting: Gastroenterology

## 2024-02-20 VITALS — BP 138/64 | HR 50 | Temp 97.7°F | Resp 13 | Ht 67.0 in | Wt 245.0 lb

## 2024-02-20 DIAGNOSIS — E119 Type 2 diabetes mellitus without complications: Secondary | ICD-10-CM | POA: Diagnosis not present

## 2024-02-20 DIAGNOSIS — D124 Benign neoplasm of descending colon: Secondary | ICD-10-CM

## 2024-02-20 DIAGNOSIS — R195 Other fecal abnormalities: Secondary | ICD-10-CM

## 2024-02-20 DIAGNOSIS — K640 First degree hemorrhoids: Secondary | ICD-10-CM

## 2024-02-20 DIAGNOSIS — E669 Obesity, unspecified: Secondary | ICD-10-CM | POA: Diagnosis not present

## 2024-02-20 DIAGNOSIS — I4891 Unspecified atrial fibrillation: Secondary | ICD-10-CM | POA: Diagnosis not present

## 2024-02-20 DIAGNOSIS — I2510 Atherosclerotic heart disease of native coronary artery without angina pectoris: Secondary | ICD-10-CM | POA: Diagnosis not present

## 2024-02-20 DIAGNOSIS — K5730 Diverticulosis of large intestine without perforation or abscess without bleeding: Secondary | ICD-10-CM

## 2024-02-20 DIAGNOSIS — Z1211 Encounter for screening for malignant neoplasm of colon: Secondary | ICD-10-CM | POA: Diagnosis not present

## 2024-02-20 MED ORDER — SODIUM CHLORIDE 0.9 % IV SOLN: 500.0000 mL | Freq: Once | INTRAVENOUS | Status: AC

## 2024-02-20 NOTE — Patient Instructions (Signed)
 Thank you for letting us  care for your healthcare needs today! Please see handouts regarding Polyps, Diverticulosis and Hemorrhoids.  - Resume previous diet & Medications - Miralax  17 G by mouth every day with 8 oz water - Resume [name]  11/22 (tomorrow) - Await pathology results  YOU HAD AN ENDOSCOPIC PROCEDURE TODAY AT THE [facility]:   Refer to the procedure report that was given to you for any specific questions about what was found during the examination.  If the procedure report does not answer your questions, please call your gastroenterologist to clarify.  If you requested that your care partner not be given the details of your procedure findings, then the procedure report has been included in a sealed envelope for you to review at your convenience later.  YOU SHOULD EXPECT: Some feelings of bloating in the abdomen. Passage of more gas than usual.  Walking can help get rid of the air that was put into your GI tract during the procedure and reduce the bloating. If you had a lower endoscopy (such as a colonoscopy or flexible sigmoidoscopy) you may notice spotting of blood in your stool or on the toilet paper. If you underwent a bowel prep for your procedure, you may not have a normal bowel movement for a few days.  Please Note:  You might notice some irritation and congestion in your nose or some drainage.  This is from the oxygen used during your procedure.  There is no need for concern and it should clear up in a day or so.  SYMPTOMS TO REPORT IMMEDIATELY:  Following lower endoscopy (colonoscopy or flexible sigmoidoscopy):  Excessive amounts of blood in the stool  Significant tenderness or worsening of abdominal pains  Swelling of the abdomen that is new, acute  Fever of 100F or higher  For urgent or emergent issues, a gastroenterologist can be reached at any hour by calling [phone]. Do not use MyChart messaging for urgent concerns.    DIET:  We do recommend a  small meal at first, but then you may proceed to your regular diet.  Drink plenty of fluids but you should avoid alcoholic beverages for 24 hours.  ACTIVITY:  You should plan to take it easy for the rest of today and you should NOT DRIVE or use heavy machinery until tomorrow (because of the sedation medicines used during the test).    FOLLOW UP: Our staff will call the number listed on your records the next business day following your procedure.  We will call around 7:15- 8:00 am to check on you and address any questions or concerns that you may have regarding the information given to you following your procedure. If we do not reach you, we will leave a message.     If any biopsies were taken you will be contacted by phone or by letter within the next 1-3 weeks.  Please call us  at [phone] if you have not heard about the biopsies in 3 weeks.    SIGNATURES/CONFIDENTIALITY: You and/or your care partner have signed paperwork which will be entered into your electronic medical record.  These signatures attest to the fact that that the information above on your After Visit Summary has been reviewed and is understood.  Full responsibility of the confidentiality of this discharge information lies with you and/or your care-partner.

## 2024-02-20 NOTE — Progress Notes (Signed)
 Sedate, gd SR, tolerated procedure well, VSS, report to RN

## 2024-02-20 NOTE — Progress Notes (Signed)
 Called to room to assist during endoscopic procedure.  Patient ID and intended procedure confirmed with present staff. Received instructions for my participation in the procedure from the performing physician.

## 2024-02-20 NOTE — Op Note (Signed)
 [facility] Patient Name: [name] Strickland Procedure Date: 02/20/2024 9:52 AM MRN: [id number] Endoscopist: Lynnie Bring , MD, [id number] Age: 73 Referring MD:  Date of Birth: October 23, 1950 Gender: Female Account #: [id number] Procedure:                Colonoscopy Indications:              Heme positive stool with nl CBC Medicines:                Monitored Anesthesia Care Procedure:                Pre-Anesthesia Assessment:                           - Prior to the procedure, a History and Physical                            was performed, and patient medications and                            allergies were reviewed. The patient's tolerance of                            previous anesthesia was also reviewed. The risks                            and benefits of the procedure and the sedation                            options and risks were discussed with the patient.                            All questions were answered, and informed consent                            was obtained. Prior Anticoagulants: [name]  held 2                            days prior. ASA Grade Assessment: III - A patient                            with severe systemic disease. After reviewing the                            risks and benefits, the patient was deemed in                            satisfactory condition to undergo the procedure.                           After obtaining informed consent, the colonoscope                            was passed under direct vision. Throughout the  procedure, the patient's blood pressure, pulse, and                            oxygen saturations were monitored continuously. The                            Olympus Scope SN: [id number] was introduced through                            the anus and advanced to the the cecum, identified                            by appendiceal orifice and ileocecal valve. The                            colonoscopy  was performed without difficulty. The                            patient tolerated the procedure well. The quality                            of the bowel preparation was adequate to identify                            polyps greater than 5 mm in size. Some retained                            stool which could not be fully washed. Overall over                            85-90% of the colonic mucosa was visualized                            satisfactorily. The ileocecal valve, appendiceal                            orifice, and rectum were photographed. Scope In: 10:05:30 AM Scope Out: 10:29:25 AM Scope Withdrawal Time: 0 hours 15 minutes 4 seconds  Total Procedure Duration: 0 hours 23 minutes 55 seconds  Findings:                 A 6 mm polyp was found in the mid descending colon.                            The polyp was sessile. The polyp was removed with a                            cold snare. Resection and retrieval were complete.                           A few small-mouthed diverticula were found in the  sigmoid colon.                           Non-bleeding internal hemorrhoids were found during                            retroflexion. The hemorrhoids were small and Grade                            I (internal hemorrhoids that do not prolapse).                           The exam was otherwise without abnormality on                            direct and retroflexion views. Complications:            No immediate complications. Estimated Blood Loss:     Estimated blood loss: none. Impression:               - One 6 mm polyp in the mid descending colon,                            removed with a cold snare. Resected and retrieved.                           - Mild sigmoid diverticulosis.                           - Non-bleeding internal hemorrhoids.                           - The examination was otherwise normal on direct                            and  retroflexion views. Recommendation:           - Patient has a contact number available for                            emergencies. The signs and symptoms of potential                            delayed complications were discussed with the                            patient. Return to normal activities tomorrow.                            Written discharge instructions were provided to the                            patient.                           - Resume previous diet.                           -  Continue present medications.                           - Await pathology results.                           - Miralax  17g po QD with 8oz water.                           - Resume [name]  11/22.                           - Repeat colonoscopy is not recommended for                            screening purposes. Hence repeat colonoscopy only                            if any new problems.                           - The findings and recommendations were discussed                            with the patient's family. Lynnie Bring, MD 02/20/2024 10:37:14 AM This report has been signed electronically.

## 2024-02-20 NOTE — Progress Notes (Signed)
 12/26/2023 [name] Strickland [id number] 07-27-1950   Referring provider: Avelina Greig BRAVO, MD Primary GI doctor: Dr. Charlanne   ASSESSMENT AND PLAN:  Positive FOBT No family history of colon cancer, has never had a colonoscopy prior but has had negative FOBT yearly No overt GI bleeding, no anemia Some more constipation over the last year with mounjaro  Likely due to hemorrhoids and constipation with mounjaro , add on miralax  1/2 capful daily, schedule for colonoscopy at [facility]  We have discussed the risks of bleeding, infection, perforation, medication reactions, and remote risk of death associated with colonoscopy. All questions were answered and the patient acknowledges these risk and wishes to proceed.   A-fib 07/20/2023 echo EF 55 to 60% no aortic stenosis Status post ablation 11/14/2023 On pradaxa  and follows with Dr. Fonda [name], per note with cardiology 12/23/2023 had preop evaluation for colonoscopy and okay to hold 3 months post ablation, gave permission to hold 2 days however patient is on pradaxa  so need 5 day hold, will still send for permission.  Hold [name]  for 5 days before procedure will instruct when and how to resume after procedure.  Patient understands that there is a low but real risk of cardiovascular event such as heart attack, stroke, or embolism /  thrombosis, or ischemia while off Eliquis   The patient consents to proceed.    CAD Coronary calcium  score 1300 left main and 3 vessel coronary calcification noted Aortic atherosclerosis 2017 low risk stress test No chest pain, no SOB   Type 2 diabetes On mounjaro  x 1 year, has had more constipation Discussed GLP1 with the patient, mechanism of action. Gastroparesis diet given to the patient.  Patient should be instructed to hold this medications if dose falls within 7 days of endoscopic procedure, due to increased risk of retained gastric contents.   Morbid obesity  Body mass index is 38.41 kg/m.  -Patient has been  advised to make an attempt to improve diet and exercise patterns to aid in weight loss. -Recommended diet heavy in fruits and veggies and low in animal meats, cheeses, and dairy products, appropriate calorie intake     Patient Care Team: [name] Greig BRAVO, MD as PCP - General (Family Medicine) [name] Fonda, MD as PCP - Electrophysiology (Clinical Cardiac Electrophysiology) Strickland, Suzann, NP as Nurse Practitioner (Clinical Cardiac Electrophysiology)   HISTORY OF PRESENT ILLNESS: 73 y.o. female with a past medical history listed below presents for evaluation of colonoscopy.    Patient had colonoscopy scheduled with Dr. Jinny 02/02/2024 however this was canceled.  Patient is on [name]  and cardiologist advised she could not stop her [name]  any sooner than 02/14/2024 due to ablation done 11/14/2023   Discussed the use of AI scribe software for clinical note transcription with the patient, who gave verbal consent to proceed.   History of Present Illness   [name] Strickland is a 73 year old female who presents for a pre-colonoscopy consultation. She was referred by Dr. Nathen for a colonoscopy after a positive fecal occult blood test.   She was scheduled for a colonoscopy with Dr. Edith, but it was canceled due to her recent cardiac ablation. She is eager to have the colonoscopy between November 15th and the end of the year, as she has met her out-of-pocket expenses for the year.   She has a history of annual fecal occult blood tests, which have been negative every year. She has no symptoms such as melena or hematochezia. She does report constipation and some changes  in her usual bowel routine, which she attributes to Mounjaro . She experiences constipation, which she attributes to Mounjaro , a medication she has been on for nearly a year. She manages constipation with increased water intake and a diet rich in fruits and vegetables, avoiding medications due to her Tikosyn  regimen.   She has a history of  atrial fibrillation since 2012, which is stress-induced. She underwent a cardiac ablation and was previously on Plaquenide for 12 years before switching to Tikosyn  last year. She experiences no chest pain or dyspnea and maintains an active lifestyle, working full-time.   She has a family history of heart disease, with both parents having had myocardial infarctions. She underwent a stress test in 2017 and has an elevated coronary calcium  score. She is on Mounjaro  for diabetes management, which she started about a year ago. Her A1c levels have fluctuated, and she is currently on a 2.5 mg dose.   No upper GI symptoms such as dysphagia, nausea, or vomiting, except for occasional GERD, which she manages with Pepto Bismol.           She  reports that she has never smoked. She has never used smokeless tobacco. She reports that she does not drink alcohol and does not use drugs.   RELEVANT GI HISTORY, IMAGING AND LABS: Results   RADIOLOGY Coronary artery calcium  score: 98       CBC Labs (Brief)          Component Value Date/Time    WBC 7.2 10/15/2023 1226    RBC 5.00 10/15/2023 1226    HGB 13.5 10/15/2023 1226    HGB 13.3 11/29/2022 1020    HCT 41.2 10/15/2023 1226    HCT 40.8 11/29/2022 1020    PLT 312.0 10/15/2023 1226    PLT 360 11/29/2022 1020    MCV 82.4 10/15/2023 1226    MCV 84 11/29/2022 1020    MCV 83 07/02/2014 0526    MCH 27.4 07/21/2023 0529    MCHC 32.8 10/15/2023 1226    RDW 14.7 10/15/2023 1226    RDW 13.2 11/29/2022 1020    RDW 14.5 07/02/2014 0526    LYMPHSABS 1.8 10/15/2023 1226    MONOABS 0.5 10/15/2023 1226    EOSABS 0.4 10/15/2023 1226    BASOSABS 0.1 10/15/2023 1226      Recent Labs (within last 365 days)       Recent Labs    07/20/23 2109 07/21/23 0529 10/15/23 1226  HGB 16.4* 15.2* 13.5        CMP     Labs (Brief)          Component Value Date/Time    NA 139 11/28/2023 1223    NA 141 04/09/2023 1613    NA 137 07/02/2014 0526    K 4.2  11/28/2023 1223    K 3.8 07/02/2014 0526    CL 102 11/28/2023 1223    CL 105 07/02/2014 0526    CO2 27 11/28/2023 1223    CO2 26 07/02/2014 0526    GLUCOSE 162 (H) 11/28/2023 1223    GLUCOSE 175 (H) 07/02/2014 0526    BUN 11 11/28/2023 1223    BUN 11 04/09/2023 1613    BUN 12 07/02/2014 0526    CREATININE 0.74 11/28/2023 1223    CREATININE 0.86 07/02/2014 0526    CALCIUM  9.1 11/28/2023 1223    CALCIUM  8.9 07/02/2014 0526    PROT 7.5 11/28/2023 1223    PROT 7.0 11/29/2022 1020    ALBUMIN  4.5 11/28/2023 1223    ALBUMIN 4.1 11/29/2022 1020    AST 18 11/28/2023 1223    ALT 14 11/28/2023 1223    ALKPHOS 68 11/28/2023 1223    BILITOT 0.8 11/28/2023 1223    BILITOT 0.5 11/29/2022 1020    GFRNONAA >60 07/22/2023 0903    GFRNONAA >60 07/02/2014 0526    GFRAA >90 07/27/2014 0346    GFRAA >60 07/02/2014 0526          Latest Ref Rng & Units 11/28/2023   12:23 PM 07/20/2023    9:09 PM 11/29/2022   10:20 AM  Hepatic Function  Total Protein 6.0 - 8.3 g/dL 7.5  8.1  7.0   Albumin 3.5 - 5.2 g/dL 4.5  4.2  4.1   AST 0 - 37 U/L 18  20  13    ALT 0 - 35 U/L 14  14  9    Alk Phosphatase 39 - 117 U/L 68  54  62   Total Bilirubin 0.2 - 1.2 mg/dL 0.8  0.6  0.5       Current Medications:    Current Outpatient Medications (Endocrine & Metabolic):    glipiZIDE  (GLUCOTROL  XL) 5 MG 24 hr tablet, Take 2 tablets (10 mg total) by mouth daily.   tirzepatide  (MOUNJARO ) 2.5 MG/0.5ML Pen, Inject 2.5 mg into the skin once a week.   Current Outpatient Medications (Cardiovascular):    atorvastatin  (LIPITOR) 20 MG tablet, Take 1 tablet (20 mg total) by mouth daily.   diltiazem  (CARDIZEM  CD) 120 MG 24 hr capsule, Take 1 capsule (120 mg total) by mouth daily.   diltiazem  (CARDIZEM ) 30 MG tablet, Take 1 tablet (30 mg total) by mouth every 6 (six) hours as needed (for persistant heart rate greater than 120).   dofetilide  (TIKOSYN ) 250 MCG capsule, Take 1 capsule (250 mcg total) by mouth 2 (two) times daily.      Current Outpatient Medications (Analgesics):    acetaminophen  (TYLENOL ) 650 MG CR tablet, Take 650 mg by mouth every 8 (eight) hours as needed for pain.   traMADol  (ULTRAM ) 50 MG tablet, Take 1 tablet (50 mg total) by mouth every 12 (twelve) hours as needed.   Current Outpatient Medications (Hematological):    dabigatran  ([name] ) 150 MG CAPS capsule, Take 1 capsule (150 mg total) by mouth 2 (two) times daily.   Current Outpatient Medications (Other):    bismuth subsalicylate (PEPTO BISMOL) 262 MG/15ML suspension, Take 30 mLs by mouth every 6 (six) hours as needed for indigestion or diarrhea or loose stools.   cyclobenzaprine  (FLEXERIL ) 10 MG tablet, Take 0.5-1 tablets (5-10 mg total) by mouth at bedtime as needed for muscle spasms.   Medical History:      Past Medical History:  Diagnosis Date   Diabetes mellitus      pt is unaware and is on no medicine for this (A1C 5.9 2012)   Dyslipidemia     Hypertension     Overweight     Paroxysmal atrial fibrillation (HCC)      chads2vasc score of at least 2   Snoring      cancelled prior sleep study due to costs   Typical atrial flutter (HCC)          Allergies:  Allergies       Allergies  Allergen Reactions   Shellfish-Derived Products Itching and Swelling            Fish Allergy Itching and Swelling   Iodine Other (See Comments)  Unknown childhood reaction to topical iodine   Contrast Media [Iodinated Contrast Media] Other (See Comments)      Unknown reaction to topical iodine as a child   Sulfonamide Derivatives Itching and Rash        Surgical History:  She  has a past surgical history that includes Appendectomy; Tonsillectomy and adenoidectomy; Knee surgery; Replacement total knee; Breast lumpectomy; Cardioversion (N/A, 07/03/2014); atrial fibrillation ablation (N/A, 07/26/2014); TEE without cardioversion (N/A, 07/26/2014); Cardioversion (N/A, 07/22/2023); and ATRIAL FIBRILLATION ABLATION (N/A, 11/14/2023). Family  History:  Her family history includes Heart attack in her father and mother; Heart disease in her father; Stroke in her father.   REVIEW OF SYSTEMS  : All other systems reviewed and negative except where noted in the History of Present Illness.   PHYSICAL EXAM: BP 130/82   Pulse 68   Ht 5' 7 (1.702 m)   Wt 245 lb 4 oz (111.2 kg)   BMI 38.41 kg/m  Physical Exam   GENERAL APPEARANCE: obese, in no apparent distress HEENT: No cervical lymphadenopathy, unremarkable thyroid, sclerae anicteric, conjunctiva pink, poor dentition RESPIRATORY: Respiratory effort normal, BS equal bilateral without rales, rhonchi, wheezing CARDIO: RRR with no MRGs, peripheral pulses intact ABDOMEN: Soft, non distended, active bowel sounds in all 4 quadrants, no tenderness to palpation, no rebound, no mass appreciated RECTAL: declines MUSCULOSKELETAL: Full ROM, normal gait, without edema, able to get on table with minimal assistance SKIN: Dry, intact without rashes or lesions. No jaundice. NEURO: Alert, oriented, no focal deficits PSYCH: Cooperative, normal mood and affect.       Alan JONELLE Coombs, PA-C     Attending physician's note   I have taken history, reviewed the chart and examined the patient. I performed a substantive portion of this encounter, including complete performance of at least one of the key components, in conjunction with the APP. I agree with the Advanced Practitioner's note, impression and recommendations.    For colon today. Her 1st H+ stools with Nl CBC [name]  on hold.   Anselm Bring, MD [facility] [phone]

## 2024-02-23 ENCOUNTER — Telehealth: Payer: Self-pay | Admitting: *Deleted

## 2024-02-23 NOTE — Telephone Encounter (Signed)
  Follow up Call-     02/20/2024    9:18 AM  Call back number  Post procedure Call Back phone  # [phone]  Permission to leave phone message Yes   Left message to call back if any questions or concerns

## 2024-02-24 ENCOUNTER — Other Ambulatory Visit: Payer: Self-pay | Admitting: Internal Medicine

## 2024-02-24 ENCOUNTER — Other Ambulatory Visit: Payer: Self-pay

## 2024-02-24 DIAGNOSIS — I4891 Unspecified atrial fibrillation: Secondary | ICD-10-CM

## 2024-02-24 MED ORDER — DABIGATRAN ETEXILATE MESYLATE 150 MG PO CAPS
150.0000 mg | ORAL_CAPSULE | Freq: Two times a day (BID) | ORAL | 3 refills | Status: AC
  Filled 2024-02-24 – 2024-03-09 (×2): qty 180, 90d supply, fill #0

## 2024-02-24 NOTE — Telephone Encounter (Signed)
 [name]  150mg  refill request received. Pt is 73 years old, weight-111.1kg, Crea-0.74 on 11/28/23, last seen by Elvie Needle on 02/16/24, Diagnosis-Afib, CrCl-122.3 mL/min; Dose is appropriate based on dosing criteria. Will send in refill to requested pharmacy.

## 2024-02-25 ENCOUNTER — Ambulatory Visit: Payer: Self-pay | Admitting: Gastroenterology

## 2024-02-25 LAB — SURGICAL PATHOLOGY

## 2024-03-08 ENCOUNTER — Other Ambulatory Visit: Payer: Self-pay | Admitting: Family Medicine

## 2024-03-08 ENCOUNTER — Other Ambulatory Visit: Payer: Self-pay | Admitting: Cardiology

## 2024-03-08 ENCOUNTER — Other Ambulatory Visit: Payer: Self-pay

## 2024-03-08 ENCOUNTER — Other Ambulatory Visit (HOSPITAL_COMMUNITY): Payer: Self-pay | Admitting: Internal Medicine

## 2024-03-08 ENCOUNTER — Telehealth: Payer: Self-pay | Admitting: Gastroenterology

## 2024-03-08 NOTE — Telephone Encounter (Signed)
 Last office visit 11/28/2023 for CPE.  Last refilled 12/03/2023 for #30 with no refills. Next appt: No future appointments with PCP.

## 2024-03-08 NOTE — Telephone Encounter (Signed)
 Inbound call from patient wanting to speak to nurse in regards to letter recently received stated precancerous polyp was removed and will not be having a following up colonoscopy and patient just wants to confirm if that's something she'll have to worry about in the future. Requesting a call back  Please advise  Thank you

## 2024-03-09 ENCOUNTER — Other Ambulatory Visit: Payer: Self-pay

## 2024-03-09 MED ORDER — MOUNJARO 2.5 MG/0.5ML ~~LOC~~ SOAJ
2.5000 mg | SUBCUTANEOUS | 3 refills | Status: AC
  Filled 2024-03-18 (×2): qty 6, 84d supply, fill #0

## 2024-03-09 MED FILL — Diltiazem HCl Coated Beads Cap ER 24HR 120 MG: ORAL | 30 days supply | Qty: 30 | Fill #0 | Status: AC

## 2024-03-09 MED FILL — Tramadol HCl Tab 50 MG: ORAL | 15 days supply | Qty: 30 | Fill #0 | Status: AC

## 2024-03-09 NOTE — Telephone Encounter (Signed)
 Left message for pt to call back

## 2024-03-09 NOTE — Addendum Note (Signed)
 Addended by: WENDELL ARLAND RAMAN on: 03/09/2024 11:18 AM   Modules accepted: Orders

## 2024-03-09 NOTE — Telephone Encounter (Signed)
 Inbound call from patient returning a call back to [place]. Patient is requesting a call back.Please advise.

## 2024-03-09 NOTE — Telephone Encounter (Signed)
 Inbound call from patient returning a call. Patient is requesting a call back. Please advise.

## 2024-03-09 NOTE — Telephone Encounter (Signed)
 Spoke with pt. Pt questioning   the path results and the letter that she received in regard to recent colonoscopy.  Results were reviewed. Pt verbalized understanding with all questions answered.

## 2024-03-10 ENCOUNTER — Other Ambulatory Visit: Payer: Self-pay

## 2024-03-13 ENCOUNTER — Other Ambulatory Visit: Payer: Self-pay

## 2024-03-17 ENCOUNTER — Ambulatory Visit (INDEPENDENT_AMBULATORY_CARE_PROVIDER_SITE_OTHER): Admitting: Family Medicine

## 2024-03-17 VITALS — BP 136/64 | HR 75 | Temp 98.6°F | Ht 67.0 in | Wt 242.4 lb

## 2024-03-17 DIAGNOSIS — L750 Bromhidrosis: Secondary | ICD-10-CM | POA: Insufficient documentation

## 2024-03-17 NOTE — Progress Notes (Signed)
 Patient ID: [name] Strickland, female    DOB: 07-24-1950, 73 y.o.   MRN: [id number]  This visit was conducted in person.  BP 136/64   Pulse 75   Temp 98.6 F (37 C) (Oral)   Ht 5' 7 (1.702 m)   Wt 242 lb 6 oz (109.9 kg)   SpO2 97%   BMI 37.96 kg/m    CC:  Chief Complaint  Patient presents with   Medical Management of Chronic Issues    Pt is concerned for odor     Subjective:   HPI: [name] Strickland is a 73 y.o. female presenting on 03/17/2024 for Medical Management of Chronic Issues (Pt is concerned for odor )  3  weeks ago.. told by office nurse that someone noted urine spell around her.  Has occ urinary leakage but none recently.  Not wearing pad.  No staining on panties.  No passing gas.   Second comment by staff.. has noted off and on feces and urine spells. No Diarrhea, no fecal incontinence.   No sweating and no skin rash or yeast under breast/groin etc.   No vaginal discharge, no itching.  Had recent colonoscopy 02/20/2024 adenoma.   She has changed baths to AM   No new meds, no  B or multi vitamins  No heavy sweating.  No diet change.   Drinking lots of water.     Using secret all over body deodorant multiple times a day.    Does have cats. Plans to wash all scrubs.. plans to keep clothes in storage.       Relevant past medical, surgical, family and social history reviewed and updated as indicated. Interim medical history since our last visit reviewed. Allergies and medications reviewed and updated. Outpatient Medications Prior to Visit  Medication Sig Dispense Refill   acetaminophen  (TYLENOL ) 650 MG CR tablet Take 650 mg by mouth every 8 (eight) hours as needed for pain.     atorvastatin  (LIPITOR) 20 MG tablet Take 1 tablet (20 mg total) by mouth daily. 90 tablet 1   bismuth subsalicylate (PEPTO BISMOL) 262 MG/15ML suspension Take 30 mLs by mouth every 6 (six) hours as needed for indigestion or diarrhea or loose stools.     cyclobenzaprine   (FLEXERIL ) 10 MG tablet Take 0.5-1 tablets (5-10 mg total) by mouth at bedtime as needed for muscle spasms. 30 tablet 0   dabigatran  ([name] ) 150 MG CAPS capsule Take 1 capsule (150 mg total) by mouth 2 (two) times daily. 180 capsule 3   diltiazem  (CARDIZEM  CD) 120 MG 24 hr capsule Take 1 capsule (120 mg total) by mouth daily. 30 capsule 6   diltiazem  (CARDIZEM ) 30 MG tablet Take 1 tablet (30 mg total) by mouth every 6 (six) hours as needed (for persistant heart rate greater than 120). 120 tablet 1   dofetilide  (TIKOSYN ) 250 MCG capsule Take 1 capsule (250 mcg total) by mouth 2 (two) times daily. 180 capsule 2   glipiZIDE  (GLUCOTROL  XL) 5 MG 24 hr tablet Take 2 tablets (10 mg total) by mouth daily. 180 tablet 1   tirzepatide  (MOUNJARO ) 2.5 MG/0.5ML Pen Inject 2.5 mg into the skin once a week. 6 mL 3   traMADol  (ULTRAM ) 50 MG tablet Take 1 tablet (50 mg total) by mouth every 12 (twelve) hours as needed. 30 tablet 0   Facility-Administered Medications Prior to Visit  Medication Dose Route Frequency Provider Last Rate Last Admin   0.9 %  sodium chloride  infusion  500  mL Intravenous Once Charlanne Groom, MD         Per HPI unless specifically indicated in ROS section below Review of Systems  Constitutional:  Negative for fatigue and fever.  HENT:  Negative for congestion.   Eyes:  Negative for pain.  Respiratory:  Negative for cough and shortness of breath.   Cardiovascular:  Negative for chest pain, palpitations and leg swelling.  Gastrointestinal:  Negative for abdominal pain.  Genitourinary:  Negative for dysuria and vaginal bleeding.  Musculoskeletal:  Negative for back pain.  Neurological:  Negative for syncope, light-headedness and headaches.  Psychiatric/Behavioral:  Negative for dysphoric mood.    Objective:  BP 136/64   Pulse 75   Temp 98.6 F (37 C) (Oral)   Ht 5' 7 (1.702 m)   Wt 242 lb 6 oz (109.9 kg)   SpO2 97%   BMI 37.96 kg/m   Wt Readings from Last 3 Encounters:   03/17/24 242 lb 6 oz (109.9 kg)  02/20/24 245 lb (111.1 kg)  02/16/24 242 lb (109.8 kg)      Physical Exam Constitutional:      General: She is not in acute distress.    Appearance: Normal appearance. She is well-developed. She is not ill-appearing or toxic-appearing.  HENT:     Head: Normocephalic.     Right Ear: Hearing, tympanic membrane, ear canal and external ear normal. Tympanic membrane is not erythematous, retracted or bulging.     Left Ear: Hearing, tympanic membrane, ear canal and external ear normal. Tympanic membrane is not erythematous, retracted or bulging.     Nose: No mucosal edema or rhinorrhea.     Right Sinus: No maxillary sinus tenderness or frontal sinus tenderness.     Left Sinus: No maxillary sinus tenderness or frontal sinus tenderness.     Mouth/Throat:     Pharynx: Uvula midline.  Eyes:     General: Lids are normal. Lids are everted, no foreign bodies appreciated.     Conjunctiva/sclera: Conjunctivae normal.     Pupils: Pupils are equal, round, and reactive to light.  Neck:     Thyroid: No thyroid mass or thyromegaly.     Vascular: No carotid bruit.     Trachea: Trachea normal.  Cardiovascular:     Rate and Rhythm: Normal rate and regular rhythm.     Pulses: Normal pulses.     Heart sounds: Normal heart sounds, S1 normal and S2 normal. No murmur heard.    No friction rub. No gallop.  Pulmonary:     Effort: Pulmonary effort is normal. No tachypnea or respiratory distress.     Breath sounds: Normal breath sounds. No decreased breath sounds, wheezing, rhonchi or rales.  Abdominal:     General: Bowel sounds are normal.     Palpations: Abdomen is soft.     Tenderness: There is no abdominal tenderness.  Musculoskeletal:     Cervical back: Normal range of motion and neck supple.  Skin:    General: Skin is warm and dry.     Findings: No rash.     Comments: No body odor noted on exam. No odor of feet.  Some possible cat urine odor noted on shirt sleeve,    Neurological:     Mental Status: She is alert.  Psychiatric:        Mood and Affect: Mood is not anxious or depressed.        Speech: Speech normal.        Behavior: Behavior  normal. Behavior is cooperative.        Thought Content: Thought content normal.        Judgment: Judgment normal.       Results for orders placed or performed in visit on 02/20/24  Surgical pathology ([facility])   Collection Time: 02/20/24 12:00 AM  Result Value Ref Range   SURGICAL PATHOLOGY      SURGICAL PATHOLOGY [facility] [address] [place], [place] [address] Telephone [phone] or [phone] Fax [phone]  REPORT OF SURGICAL PATHOLOGY   Accession #: [id number] Patient Name: KEYONI, LAPINSKI Visit # : [id number]  MRN: [id number] Physician: Charlanne Groom DOB/Age 10-May-1950 (Age: 83) Gender: F Collected Date: 02/20/2024 Received Date: 02/23/2024  FINAL DIAGNOSIS       1. Surgical [P], colon, descending, polyp (1) :       - TUBULAR ADENOMA.      - NO HIGH GRADE DYSPLASIA OR MALIGNANCY.       DATE SIGNED OUT: 02/25/2024 ELECTRONIC SIGNATURE BETHA Pottier M.D., Nupur, Pathologist, Electronic Signature  MICROSCOPIC DESCRIPTION  CASE COMMENTS STAINS USED IN DIAGNOSIS: H&E    CLINICAL HISTORY  SPECIMEN(S) OBTAINED 1. Surgical [P], Colon, Descending, Polyp (1)  SPECIMEN COMMENTS: 1. Positive fecal occult blood test; benign neoplasm of descending colon SPECIMEN CLINICAL INFORMATION: 1. R/O adenoma     Gross Description 1. Received in formalin are tan, soft tissue fragments that are submitted in toto. Number: 1 Size: 0.6 cm, (1B) ( TA )        Report signed out from the following location(s) [facility] [address] [place], [place] [address] CLIA #: [id number]  [facility] [address] [place], [place] [address] CLIA #: [id number]     Assessment and Plan  Body odor Assessment &  Plan:  Acute, noted by co-worker Good hygiene. No current urinary or stool incontinence.  No constipation or diarrhea. No increase in flatulence. No sweating or skin rash. No vaginal discharge or itching. She is not on any medications that contribute to body odor.  No dietary changes, she drinks a lot of water. She does have cats and will look into whether they could be spraying her clothing.  If not resolving she will consider lab evaluation to rule out any internal reason for increase in odor.  Orders: -     Comprehensive metabolic panel with GFR; Future -     TSH; Future -     Hemoglobin A1c; Future    No follow-ups on file.   Greig Ring, MD

## 2024-03-17 NOTE — Assessment & Plan Note (Signed)
 Acute, noted by co-worker Good hygiene. No current urinary or stool incontinence.  No constipation or diarrhea. No increase in flatulence. No sweating or skin rash. No vaginal discharge or itching. She is not on any medications that contribute to body odor.  No dietary changes, she drinks a lot of water. She does have cats and will look into whether they could be spraying her clothing.  If not resolving she will consider lab evaluation to rule out any internal reason for increase in odor.

## 2024-03-18 ENCOUNTER — Other Ambulatory Visit: Payer: Self-pay

## 2024-03-18 MED FILL — Dofetilide Cap 250 MCG (0.25 MG): 90 days supply | Qty: 180 | Fill #2 | Status: AC

## 2024-04-09 ENCOUNTER — Other Ambulatory Visit (HOSPITAL_COMMUNITY): Payer: Self-pay

## 2024-04-11 NOTE — Progress Notes (Unsigned)
 "     Electrophysiology Clinic Note    Date:  04/12/2024  Patient ID:  [name], Strickland [birth year], MRN [id number] PCP:  Avelina Greig BRAVO, MD  Cardiologist:  None   Electrophysiologist:  Fonda Kitty, MD  Electrophysiology APP:  Taliyah Watrous, NP    Discussed the use of AI scribe software for clinical note transcription with the patient, who gave verbal consent to proceed.   Patient Profile    Chief Complaint: AF follow-up  History of Present Illness: [name] Strickland is a 74 y.o. female with PMH notable for persis Afib, HTN, HLD, T2DM; seen today for Fonda Kitty, MD for routine electrophysiology follow-up.  She is s/p AF ablation w PVI and CTI 2016 by Dr. Waddell.  She had recurrence of AFib on tikosyn , and is now s/p redo AF ablation w isolation of pulm veins and posterior wall on 11/14/2023 by Dr. Kitty.   She had an AFib episode prior to her 3 mon appt that lasted several hours. She was concerned stress/anxiety were contributing to AFib. We discussed switching to amiodarone,  and patient requested to continue tikosyn  and work on stress mgmt.   On follow-up today, she is doing very well.  She has not had any atrial fibrillation episodes since her last appointment.  She continues to take Tikosyn  every 12 hours along with her [name]  without missing doses. No bleeding concerns on [name] .     Arrhythmia/Device History Tikosyn  - loaded 2024    ROS:  Please see the history of present illness. All other systems are reviewed and otherwise negative.    Physical Exam    VS:  BP (!) 140/62 (BP Location: Left Arm, Patient Position: Sitting, Cuff Size: Normal)   Pulse 69   Ht 5' 8 (1.727 m)   Wt 239 lb 6.4 oz (108.6 kg)   SpO2 96%   BMI 36.40 kg/m  BMI: Body mass index is 36.4 kg/m.       Wt Readings from Last 3 Encounters:  04/12/24 239 lb 6.4 oz (108.6 kg)  03/17/24 242 lb 6 oz (109.9 kg)  02/20/24 245 lb (111.1 kg)     GEN- The patient is well appearing, alert  and oriented x 3 today. Lungs- Clear to ausculation bilaterally, normal work of breathing.  Heart- Regular rate and rhythm, no murmurs, rubs or gallops Extremities- Trace peripheral edema, warm, dry.   Studies Reviewed   Previous EP, cardiology notes.    EKG is ordered. Personal review of EKG from today shows:          02/16/2024 EKG - junctional at 57bpm; QTC 473 12/23/2023 EKG - SR w PAC, rate 78; QTC 07/2023 EKG - SR at 62bpm, low voltage; QTC 07/22/2023 EKG - SR at 63bpm, low voltage; QTC   Cardiac CTA, 10/23/2023 1. There is normal pulmonary vein drainage into the left atrium.  2. The left atrial appendage is large chicken wing / broccoli type with a single lobe and ostial size 22 x 15 mm and length 41 mm. There is no thrombus in the left atrial appendage, verified on PV delay images. 3. The esophagus runs near the ostium of the LIPV. 4. Calcium  score: Coronary calcium  score of 1301. This was 98th percentile for age-, race-, and sex-matched controls (MESA). Left main and 3 vessel coronary calcification was noted. 5. Aorta: Borderline dilated at 38 mm, level of the main PA bifurcation. Aortic atherosclerosis. 6. Mild aortic valve calcification. 7. Dilated main pulmonary  artery to 31 mm, suggestive of possible pulmonary hypertension.  TTE, 07/21/2023  1. Left ventricular ejection fraction, by estimation, is 55 to 60%. The left ventricle has normal function. The left ventricle has no regional wall motion abnormalities. There is mild left ventricular hypertrophy. Left ventricular diastolic parameters are indeterminate.   2. Right ventricular systolic function is low normal. The right ventricular size is normal.   3. The mitral valve is normal in structure. Trivial mitral valve regurgitation.   4. The aortic valve is tricuspid. There is moderate calcification of the aortic valve. There is moderate thickening of the aortic valve. Aortic valve regurgitation is not  visualized. Aortic valve sclerosis/calcification is present, without any evidence of aortic stenosis.   5. There is mild dilatation of the ascending aorta, measuring 39 mm.   6. The inferior vena cava is normal in size with <50% respiratory variability, suggesting right atrial pressure of 8 mmHg.   Assessment and Plan     #) persis Afib #) tikosyn  monitoring S/p AF ablation 2016, s/p redo AF ablation 10/2023 She has had 1 hours long episode of atrial fibrillation since her ablation.  No recent episodes of A-fib QTC stable on 250mcg tikosyn  BID, will continue Update BMP, Mag today  #) Hypercoag d/t persis afib CHA2DS2-VASc Score = at least 4 [CHF History: 0, HTN History: 1, Diabetes History: 1, Stroke History: 0, Vascular Disease History: 0, Age Score: 1, Gender Score: 1].  Therefore, the patient's annual risk of stroke is 4.8 %.    Stroke ppx - 150mg  pradaxa  BID, appropriately dosed      Current medicines are reviewed at length with the patient today.   The patient does not have concerns regarding her medicines.  The following changes were made today:  none  Labs/ tests ordered today include:  Orders Placed This Encounter  Procedures   Basic metabolic panel with GFR   Magnesium    CBC   EKG 12-Lead     Disposition: Follow up with Dr. Kennyth or EP APP in 3-4 months    Signed, Davy Westmoreland, NP  04/12/2024  3:46 PM  Electrophysiology [facility] "

## 2024-04-12 ENCOUNTER — Encounter: Payer: Self-pay | Admitting: Cardiology

## 2024-04-12 ENCOUNTER — Ambulatory Visit: Attending: Cardiology | Admitting: Cardiology

## 2024-04-12 VITALS — BP 140/62 | HR 69 | Ht 68.0 in | Wt 239.4 lb

## 2024-04-12 DIAGNOSIS — Z79899 Other long term (current) drug therapy: Secondary | ICD-10-CM

## 2024-04-12 DIAGNOSIS — Z5181 Encounter for therapeutic drug level monitoring: Secondary | ICD-10-CM | POA: Diagnosis not present

## 2024-04-12 DIAGNOSIS — I4819 Other persistent atrial fibrillation: Secondary | ICD-10-CM

## 2024-04-12 DIAGNOSIS — D6869 Other thrombophilia: Secondary | ICD-10-CM

## 2024-04-12 NOTE — Patient Instructions (Signed)
 Medication Instructions:  Your physician recommends that you continue on your current medications as directed. Please refer to the Current Medication list given to you today.  *If you need a refill on your cardiac medications before your next appointment, please call your pharmacy*  Lab Work: Your provider would like for you to have following labs drawn today BMP, CBC and Magnesium .     Testing/Procedures: No test ordered today   Follow-Up: At [facility], you and your health needs are our priority.  As part of our continuing mission to provide you with exceptional heart care, our providers are all part of one team.  This team includes your primary Cardiologist (physician) and Advanced Practice Providers or APPs (Physician Assistants and Nurse Practitioners) who all work together to provide you with the care you need, when you need it.  Your next appointment:   4 month(s)  Provider:   Suzann Riddle, NP

## 2024-04-13 ENCOUNTER — Ambulatory Visit: Payer: Self-pay | Admitting: Cardiology

## 2024-04-13 LAB — CBC
Hematocrit: 45.1 % (ref 34.0–46.6)
Hemoglobin: 14.6 g/dL (ref 11.1–15.9)
MCH: 27.2 pg (ref 26.6–33.0)
MCHC: 32.4 g/dL (ref 31.5–35.7)
MCV: 84 fL (ref 79–97)
Platelets: 346 x10E3/uL (ref 150–450)
RBC: 5.37 x10E6/uL — ABNORMAL HIGH (ref 3.77–5.28)
RDW: 13.4 % (ref 11.7–15.4)
WBC: 7.8 x10E3/uL (ref 3.4–10.8)

## 2024-04-13 LAB — BASIC METABOLIC PANEL WITH GFR
BUN/Creatinine Ratio: 8 — ABNORMAL LOW (ref 12–28)
BUN: 6 mg/dL — ABNORMAL LOW (ref 8–27)
CO2: 24 mmol/L (ref 20–29)
Calcium: 9.8 mg/dL (ref 8.7–10.3)
Chloride: 99 mmol/L (ref 96–106)
Creatinine, Ser: 0.72 mg/dL (ref 0.57–1.00)
Glucose: 212 mg/dL — ABNORMAL HIGH (ref 70–99)
Potassium: 4.4 mmol/L (ref 3.5–5.2)
Sodium: 138 mmol/L (ref 134–144)
eGFR: 89 mL/min/1.73

## 2024-04-13 LAB — MAGNESIUM: Magnesium: 2 mg/dL (ref 1.6–2.3)

## 2024-04-14 ENCOUNTER — Other Ambulatory Visit (HOSPITAL_COMMUNITY): Payer: Self-pay

## 2024-04-14 MED FILL — Diltiazem HCl Coated Beads Cap ER 24HR 120 MG: ORAL | 90 days supply | Qty: 90 | Fill #0 | Status: CN

## 2024-04-15 ENCOUNTER — Other Ambulatory Visit: Payer: Self-pay

## 2024-04-15 ENCOUNTER — Other Ambulatory Visit (HOSPITAL_COMMUNITY): Payer: Self-pay

## 2024-04-15 MED FILL — Diltiazem HCl Coated Beads Cap ER 24HR 120 MG: ORAL | 90 days supply | Qty: 90 | Fill #0 | Status: AC

## 2024-04-28 ENCOUNTER — Other Ambulatory Visit: Payer: Self-pay

## 2024-04-28 ENCOUNTER — Other Ambulatory Visit: Payer: Self-pay | Admitting: Family Medicine

## 2024-04-28 MED ORDER — GLIPIZIDE ER 5 MG PO TB24
10.0000 mg | ORAL_TABLET | Freq: Every day | ORAL | 3 refills | Status: AC
  Filled 2024-04-28: qty 180, 90d supply, fill #0

## 2024-04-29 ENCOUNTER — Other Ambulatory Visit: Payer: Self-pay

## 2024-04-29 ENCOUNTER — Other Ambulatory Visit (HOSPITAL_COMMUNITY): Payer: Self-pay

## 2024-08-26 ENCOUNTER — Ambulatory Visit: Admitting: Cardiology
